# Patient Record
Sex: Female | Born: 1950 | Race: White | Hispanic: No | Smoking: Current every day smoker
Health system: Southern US, Community
[De-identification: ages and names within clinical notes are randomized; demographics above are authoritative.]

## PROBLEM LIST (undated history)

## (undated) VITALS — BP 91/68 | HR 87 | Temp 97.7°F | Resp 16 | Ht 61.5 in | Wt 177.0 lb

## (undated) DIAGNOSIS — R51 Headache: Secondary | ICD-10-CM

## (undated) DIAGNOSIS — J9691 Respiratory failure, unspecified with hypoxia: Secondary | ICD-10-CM

## (undated) DIAGNOSIS — H9190 Unspecified hearing loss, unspecified ear: Secondary | ICD-10-CM

## (undated) DIAGNOSIS — K7460 Unspecified cirrhosis of liver: Secondary | ICD-10-CM

## (undated) DIAGNOSIS — J189 Pneumonia, unspecified organism: Secondary | ICD-10-CM

## (undated) DIAGNOSIS — I5030 Unspecified diastolic (congestive) heart failure: Secondary | ICD-10-CM

## (undated) DIAGNOSIS — F419 Anxiety disorder, unspecified: Secondary | ICD-10-CM

## (undated) DIAGNOSIS — M2550 Pain in unspecified joint: Secondary | ICD-10-CM

## (undated) DIAGNOSIS — Z8489 Family history of other specified conditions: Secondary | ICD-10-CM

## (undated) DIAGNOSIS — M2540 Effusion, unspecified joint: Secondary | ICD-10-CM

## (undated) DIAGNOSIS — F329 Major depressive disorder, single episode, unspecified: Secondary | ICD-10-CM

## (undated) DIAGNOSIS — F1010 Alcohol abuse, uncomplicated: Secondary | ICD-10-CM

## (undated) DIAGNOSIS — I10 Essential (primary) hypertension: Secondary | ICD-10-CM

## (undated) DIAGNOSIS — F1020 Alcohol dependence, uncomplicated: Secondary | ICD-10-CM

## (undated) DIAGNOSIS — R351 Nocturia: Secondary | ICD-10-CM

## (undated) DIAGNOSIS — D649 Anemia, unspecified: Secondary | ICD-10-CM

## (undated) DIAGNOSIS — J449 Chronic obstructive pulmonary disease, unspecified: Secondary | ICD-10-CM

## (undated) DIAGNOSIS — R6510 Systemic inflammatory response syndrome (SIRS) of non-infectious origin without acute organ dysfunction: Secondary | ICD-10-CM

## (undated) DIAGNOSIS — M1990 Unspecified osteoarthritis, unspecified site: Secondary | ICD-10-CM

## (undated) DIAGNOSIS — J45909 Unspecified asthma, uncomplicated: Secondary | ICD-10-CM

## (undated) DIAGNOSIS — F32A Depression, unspecified: Secondary | ICD-10-CM

## (undated) DIAGNOSIS — Z9981 Dependence on supplemental oxygen: Secondary | ICD-10-CM

## (undated) DIAGNOSIS — R531 Weakness: Secondary | ICD-10-CM

## (undated) DIAGNOSIS — R350 Frequency of micturition: Secondary | ICD-10-CM

## (undated) HISTORY — DX: Respiratory failure, unspecified with hypoxia: J96.91

## (undated) HISTORY — PX: CYSTECTOMY: SHX5119

## (undated) HISTORY — DX: Essential (primary) hypertension: I10

## (undated) HISTORY — DX: Depression, unspecified: F32.A

## (undated) HISTORY — PX: DILATION AND CURETTAGE OF UTERUS: SHX78

## (undated) HISTORY — PX: OTHER SURGICAL HISTORY: SHX169

## (undated) HISTORY — DX: Major depressive disorder, single episode, unspecified: F32.9

## (undated) HISTORY — DX: Unspecified diastolic (congestive) heart failure: I50.30

## (undated) HISTORY — PX: TUBAL LIGATION: SHX77

## (undated) HISTORY — PX: CHOLECYSTECTOMY: SHX55

## (undated) HISTORY — PX: ABDOMINAL HYSTERECTOMY: SHX81

## (undated) HISTORY — DX: Alcohol dependence, uncomplicated: F10.20

## (undated) HISTORY — DX: Unspecified hearing loss, unspecified ear: H91.90

## (undated) HISTORY — PX: TOTAL ABDOMINAL HYSTERECTOMY: SHX209

## (undated) HISTORY — DX: Pneumonia, unspecified organism: J18.9

## (undated) HISTORY — DX: Systemic inflammatory response syndrome (sirs) of non-infectious origin without acute organ dysfunction: R65.10

## (undated) HISTORY — DX: Dependence on supplemental oxygen: Z99.81

## (undated) HISTORY — DX: Chronic obstructive pulmonary disease, unspecified: J44.9

---

## 2004-11-11 ENCOUNTER — Emergency Department (HOSPITAL_COMMUNITY): Admission: EM | Admit: 2004-11-11 | Discharge: 2004-11-11 | Payer: Self-pay | Admitting: Emergency Medicine

## 2008-08-14 ENCOUNTER — Emergency Department (HOSPITAL_BASED_OUTPATIENT_CLINIC_OR_DEPARTMENT_OTHER): Admission: EM | Admit: 2008-08-14 | Discharge: 2008-08-14 | Payer: Self-pay | Admitting: Emergency Medicine

## 2008-08-16 ENCOUNTER — Emergency Department (HOSPITAL_COMMUNITY): Admission: EM | Admit: 2008-08-16 | Discharge: 2008-08-16 | Payer: Self-pay | Admitting: Emergency Medicine

## 2008-08-19 ENCOUNTER — Ambulatory Visit: Payer: Self-pay | Admitting: Internal Medicine

## 2008-08-19 DIAGNOSIS — R209 Unspecified disturbances of skin sensation: Secondary | ICD-10-CM | POA: Insufficient documentation

## 2008-08-19 DIAGNOSIS — I10 Essential (primary) hypertension: Secondary | ICD-10-CM | POA: Insufficient documentation

## 2008-08-19 LAB — CONVERTED CEMR LAB
Folate: 18.5 ng/mL
Relative Index: 1.9 (ref 0.0–2.5)
TSH: 1.76 microintl units/mL (ref 0.35–5.50)
Total CK: 97 units/L (ref 7–177)

## 2008-08-21 DIAGNOSIS — D649 Anemia, unspecified: Secondary | ICD-10-CM

## 2008-08-21 DIAGNOSIS — I10 Essential (primary) hypertension: Secondary | ICD-10-CM | POA: Insufficient documentation

## 2008-09-02 ENCOUNTER — Ambulatory Visit: Payer: Self-pay | Admitting: Internal Medicine

## 2009-11-03 ENCOUNTER — Emergency Department (HOSPITAL_COMMUNITY): Admission: EM | Admit: 2009-11-03 | Discharge: 2009-11-03 | Payer: Self-pay | Admitting: Emergency Medicine

## 2009-11-12 ENCOUNTER — Ambulatory Visit: Payer: Self-pay | Admitting: Internal Medicine

## 2009-11-12 DIAGNOSIS — F17200 Nicotine dependence, unspecified, uncomplicated: Secondary | ICD-10-CM | POA: Insufficient documentation

## 2010-06-16 ENCOUNTER — Inpatient Hospital Stay (HOSPITAL_COMMUNITY)
Admission: EM | Admit: 2010-06-16 | Discharge: 2010-06-20 | Disposition: A | Discharge status: Other Acute Inpatient Hospital | Payer: Self-pay | Source: Home / Self Care | Admitting: Emergency Medicine

## 2010-06-19 ENCOUNTER — Ambulatory Visit: Payer: Self-pay | Admitting: Psychiatry

## 2010-06-20 ENCOUNTER — Inpatient Hospital Stay (HOSPITAL_COMMUNITY): Admission: AD | Admit: 2010-06-20 | Discharge: 2010-06-25 | Payer: Self-pay | Admitting: Psychiatry

## 2010-08-13 ENCOUNTER — Ambulatory Visit: Payer: Self-pay | Admitting: Psychiatry

## 2010-09-10 ENCOUNTER — Emergency Department (HOSPITAL_COMMUNITY)
Admission: EM | Admit: 2010-09-10 | Discharge: 2010-09-10 | Payer: Self-pay | Source: Home / Self Care | Admitting: Family Medicine

## 2010-10-27 NOTE — Letter (Signed)
Summary: Out of Work  [facility]  [address] [place], [place] [address]   Phone: [phone]  Fax: [phone]    November 12, 2009   Employee:  VIVIANNE CARLES Gola    To Whom It May Concern:   For Medical reasons, please excuse the above named employee from work for the following dates:  Start:   11/03/09  End:   11/17/09  If you need additional information, please feel free to contact our office.         Sincerely,    Etta Grandchild MD

## 2010-10-27 NOTE — Assessment & Plan Note (Signed)
Summary: post hospital [place]/#/cd   Vital Signs:  Patient profile:   60 year old female Weight:      178 pounds O2 Sat:      94 % on Room air Temp:     98.8 degrees F oral Pulse rate:   90 / minute Pulse rhythm:   regular Resp:     20 per minute BP sitting:   144 / 92  (left arm) Cuff size:   large  Vitals Entered By: Rock Nephew CMA (November 12, 2009 3:09 PM)  O2 Flow:  Room air CC: hospital follow up/discuss BP   Primary Care Provider:  Etta Grandchild MD  CC:  hospital follow up/discuss BP.  History of Present Illness: She returns for f/up after a viral URI with COPD exacerbation that sent her to ther ER and [facility]. She feels much better with less cough. She had a normal CT of the chest and labs were normal with no anemia. BS was 112 with normal renal function.  Preventive Screening-Counseling & Management  Alcohol-Tobacco     Alcohol drinks/day: 0     Smoking Status: current     Smoking Cessation Counseling: yes     Smoke Cessation Stage: ready     Packs/Day: 2     Year Started: 1969     Cans of tobacco/week: no     Passive Smoke Exposure: yes  Hep-HIV-STD-Contraception     Hepatitis Risk: no risk noted     HIV Risk: no     STD Risk: no risk noted      Drug Use:  no.    Medications Prior to Update: 1)  Diovan Hct 320-25 Mg Tabs (Valsartan-Hydrochlorothiazide) .... Once Daily 2)  Ibuprofen 800 Mg Tabs (Ibuprofen) .... As Needed  Allergies (verified): 1)  ! Codeine  Past History:  Past Medical History: Reviewed history from 08/19/2008 and no changes required. Anemia-NOS Hypertension  Past Surgical History: Reviewed history from 08/19/2008 and no changes required. Denies surgical history  Family History: Reviewed history from 08/19/2008 and no changes required. Family History Hypertension  Social History: Reviewed history from 08/19/2008 and no changes required. Divorced Current Smoker Alcohol use-no Drug use-no Regular  exercise-no Hepatitis Risk:  no risk noted STD Risk:  no risk noted  Review of Systems       The patient complains of weight gain.  The patient denies anorexia, fever, weight loss, chest pain, syncope, dyspnea on exertion, peripheral edema, headaches, hemoptysis, melena, hematuria, suspicious skin lesions, abnormal bleeding, enlarged lymph nodes, angioedema, and breast masses.    Physical Exam  General:  Well-developed,well-nourished,in no acute distress; alert,appropriate and cooperative throughout examination Head:  normocephalic, atraumatic, no abnormalities observed, and no abnormalities palpated.   Ears:  R ear normal and L ear normal.   Mouth:  Oral mucosa and oropharynx without lesions or exudates.  Teeth in good repair. Neck:  supple, full ROM, no masses, and no thyromegaly.   Lungs:  Normal respiratory effort, chest expands symmetrically. Lungs are clear to auscultation, no crackles or wheezes. Heart:  Normal rate and regular rhythm. S1 and S2 normal without gallop, murmur, click, rub or other extra sounds. Abdomen:  soft, non-tender, normal bowel sounds, no hepatomegaly, and no splenomegaly.   Msk:  No deformity or scoliosis noted of thoracic or lumbar spine.   Pulses:  R and L carotid,radial,femoral,dorsalis pedis and posterior tibial pulses are full and equal bilaterally Extremities:  No clubbing, cyanosis, edema, or deformity noted with normal full  range of motion of all joints.   Neurologic:  No cranial nerve deficits noted. Station and gait are normal. Plantar reflexes are down-going bilaterally. DTRs are symmetrical throughout. Sensory, motor and coordinative functions appear intact. Skin:  turgor normal, color normal, no rashes, and no edema.   Cervical Nodes:  no anterior cervical adenopathy and no posterior cervical adenopathy.   Axillary Nodes:  no R axillary adenopathy and no L axillary adenopathy.   Psych:  Cognition and judgment appear intact. Alert and cooperative  with normal attention span and concentration. No apparent delusions, illusions, hallucinations   Impression & Recommendations:  Problem # 1:  HYPERTENSION (ICD-401.9) Assessment Unchanged she has been out of DiovanHCT 320-25 so will start her at a new dose today. The following medications were removed from the medication list:    Diovan Hct 320-25 Mg Tabs (Valsartan-hydrochlorothiazide) ..... Once daily Her updated medication list for this problem includes:    Diovan Hct 80-12.5 Mg Tabs (Valsartan-hydrochlorothiazide) ..... One by mouth once daily for high blood pressure  BP today: 144/92 Prior BP: 124/84 (09/02/2008)  Prior 10 Yr Risk Heart Disease: Not enough information (09/02/2008)  Problem # 2:  COPD, MILD (ICD-496) Assessment: New  Her updated medication list for this problem includes:    Dulera 100-5 Mcg/act Aero (Mometasone furo-formoterol fum) .Marland Kitchen... 2 puffs bid  Pulmonary Functions Reviewed: O2 sat: 94 (11/12/2009)     Problem # 3:  TOBACCO USE (ICD-305.1) Assessment: New  Encouraged smoking cessation and discussed different methods for smoking cessation.   Complete Medication List: 1)  Ibuprofen 800 Mg Tabs (Ibuprofen) .... As needed 2)  Diovan Hct 80-12.5 Mg Tabs (Valsartan-hydrochlorothiazide) .... One by mouth once daily for high blood pressure 3)  Dulera 100-5 Mcg/act Aero (Mometasone furo-formoterol fum) .... 2 puffs bid  Patient Instructions: 1)  Please schedule a follow-up appointment in 2 months. 2)  Tobacco is very bad for your health and your loved ones! You Should stop smoking!. 3)  Stop Smoking Tips: Choose a Quit date. Cut down before the Quit date. decide what you will do as a substitute when you feel the urge to smoke(gum,toothpick,exercise). 4)  Check your Blood Pressure regularly. If it is above 130/80: you should make an appointment. Prescriptions: DULERA 100-5 MCG/ACT AERO (MOMETASONE FURO-FORMOTEROL FUM) 2 puffs BID  #8 inhs. x 0   Entered and  Authorized by:   Etta Grandchild MD   Signed by:   Etta Grandchild MD on 11/12/2009   Method used:   Samples Given   RxID:   [id number] DIOVAN HCT 80-12.5 MG TABS (VALSARTAN-HYDROCHLOROTHIAZIDE) One by mouth once daily for high blood pressure  #112 x 0   Entered and Authorized by:   Etta Grandchild MD   Signed by:   Etta Grandchild MD on 11/12/2009   Method used:   Historical   RxID:   [id number]

## 2010-11-16 ENCOUNTER — Telehealth (INDEPENDENT_AMBULATORY_CARE_PROVIDER_SITE_OTHER): Payer: Self-pay | Admitting: *Deleted

## 2010-11-24 NOTE — Progress Notes (Signed)
  Phone Note Other Incoming   Request: Send information Summary of Call: Request for records received from [facility]. Request forwarded to [facility].  09/2008

## 2010-12-10 LAB — COMPREHENSIVE METABOLIC PANEL
AST: 142 U/L — ABNORMAL HIGH (ref 0–37)
AST: 152 U/L — ABNORMAL HIGH (ref 0–37)
AST: 196 U/L — ABNORMAL HIGH (ref 0–37)
Alkaline Phosphatase: 77 U/L (ref 39–117)
BUN: 13 mg/dL (ref 6–23)
BUN: 9 mg/dL (ref 6–23)
BUN: 9 mg/dL (ref 6–23)
CO2: 25 mEq/L (ref 19–32)
CO2: 25 mEq/L (ref 19–32)
CO2: 27 mEq/L (ref 19–32)
Calcium: 10.5 mg/dL (ref 8.4–10.5)
Calcium: 9 mg/dL (ref 8.4–10.5)
Chloride: 103 mEq/L (ref 96–112)
Creatinine, Ser: 0.66 mg/dL (ref 0.4–1.2)
Creatinine, Ser: 0.67 mg/dL (ref 0.4–1.2)
Creatinine, Ser: 0.73 mg/dL (ref 0.4–1.2)
GFR calc Af Amer: >60 mL/min (ref >60)
GFR calc Af Amer: >60 mL/min (ref >60)
GFR calc non Af Amer: >60 mL/min (ref >60)
GFR calc non Af Amer: >60 mL/min (ref >60)
GFR calc non Af Amer: >60 mL/min (ref >60)
Glucose, Bld: 130 mg/dL — ABNORMAL HIGH (ref 70–99)
Glucose, Bld: 97 mg/dL (ref 70–99)
Potassium: 3.8 mEq/L (ref 3.5–5.1)
Total Bilirubin: 0.8 mg/dL (ref 0.3–1.2)
Total Protein: 7.3 g/dL (ref 6.0–8.3)
Total Protein: 8.8 g/dL — ABNORMAL HIGH (ref 6.0–8.3)

## 2010-12-10 LAB — CBC
HCT: 37.2 % (ref 36.0–46.0)
HCT: 40.9 % (ref 36.0–46.0)
Hemoglobin: 12.8 g/dL (ref 12.0–15.0)
MCH: 32.9 pg (ref 26.0–34.0)
MCH: 32.9 pg (ref 26.0–34.0)
MCH: 33.1 pg (ref 26.0–34.0)
MCH: 33.3 pg (ref 26.0–34.0)
MCHC: 34.5 g/dL (ref 30.0–36.0)
MCHC: 34.7 g/dL (ref 30.0–36.0)
MCV: 96 fL (ref 78.0–100.0)
MCV: 96.4 fL (ref 78.0–100.0)
Platelets: 175 10*3/uL (ref 150–400)
Platelets: 196 10*3/uL (ref 150–400)
RBC: 3.8 MIL/uL — ABNORMAL LOW (ref 3.87–5.11)
RDW: 13.4 % (ref 11.5–15.5)
RDW: 14.4 % (ref 11.5–15.5)
WBC: 11.5 10*3/uL — ABNORMAL HIGH (ref 4.0–10.5)

## 2010-12-10 LAB — GLUCOSE, CAPILLARY
Glucose-Capillary: 101 mg/dL — ABNORMAL HIGH (ref 70–99)
Glucose-Capillary: 105 mg/dL — ABNORMAL HIGH (ref 70–99)
Glucose-Capillary: 113 mg/dL — ABNORMAL HIGH (ref 70–99)
Glucose-Capillary: 126 mg/dL — ABNORMAL HIGH (ref 70–99)
Glucose-Capillary: 131 mg/dL — ABNORMAL HIGH (ref 70–99)
Glucose-Capillary: 200 mg/dL — ABNORMAL HIGH (ref 70–99)

## 2010-12-10 LAB — DIFFERENTIAL
Basophils Absolute: 0.2 10*3/uL — ABNORMAL HIGH (ref 0.0–0.1)
Basophils Relative: 2 % — ABNORMAL HIGH (ref 0–1)
Monocytes Relative: 5 % (ref 3–12)
Neutro Abs: 12.9 10*3/uL — ABNORMAL HIGH (ref 1.7–7.7)
Neutrophils Relative %: 89 % — ABNORMAL HIGH (ref 43–77)

## 2010-12-10 LAB — AMMONIA: Ammonia: 11 umol/L (ref 11–35)

## 2010-12-10 LAB — URINALYSIS, ROUTINE W REFLEX MICROSCOPIC
Nitrite: POSITIVE — AB
Protein, ur: 30 mg/dL — AB
Specific Gravity, Urine: 1.011 (ref 1.005–1.030)
Urobilinogen, UA: 0.2 mg/dL (ref 0.0–1.0)

## 2010-12-10 LAB — CULTURE, BLOOD (ROUTINE X 2)
Culture  Setup Time: 201109211353
Culture: NO GROWTH

## 2010-12-10 LAB — HEMOGLOBIN A1C
Hgb A1c MFr Bld: 5.8 % — ABNORMAL HIGH (ref <5.7)
Mean Plasma Glucose: 120 mg/dL — ABNORMAL HIGH (ref <117)

## 2010-12-10 LAB — T4: T4, Total: 6.9 ug/dL (ref 5.0–12.5)

## 2010-12-10 LAB — SALICYLATE LEVEL: Salicylate Lvl: <4 mg/dL (ref 2.8–20.0)

## 2010-12-10 LAB — URINE MICROSCOPIC-ADD ON

## 2010-12-10 LAB — PROTIME-INR
INR: 1.15 (ref 0.00–1.49)
Prothrombin Time: 14.9 seconds (ref 11.6–15.2)

## 2010-12-10 LAB — TSH
TSH: 0.699 u[IU]/mL (ref 0.350–4.500)
TSH: 1.259 u[IU]/mL (ref 0.350–4.500)

## 2010-12-10 LAB — BLOOD GAS, ARTERIAL
Bicarbonate: 20.1 mEq/L (ref 20.0–24.0)
pCO2 arterial: 37.3 mmHg (ref 35.0–45.0)
pH, Arterial: 7.35 (ref 7.350–7.400)
pO2, Arterial: 116 mmHg — ABNORMAL HIGH (ref 80.0–100.0)

## 2010-12-10 LAB — RAPID URINE DRUG SCREEN, HOSP PERFORMED
Amphetamines: NOT DETECTED
Barbiturates: NOT DETECTED
Opiates: NOT DETECTED

## 2010-12-10 LAB — URINE CULTURE
Colony Count: >=100000
Culture  Setup Time: 201109210227

## 2010-12-10 LAB — MRSA PCR SCREENING: MRSA by PCR: NEGATIVE

## 2010-12-10 LAB — LIPID PANEL
Cholesterol: 197 mg/dL (ref 0–200)
LDL Cholesterol: 126 mg/dL — ABNORMAL HIGH (ref 0–99)
Triglycerides: 146 mg/dL (ref <150)

## 2010-12-10 LAB — CARDIAC PANEL(CRET KIN+CKTOT+MB+TROPI)
CK, MB: 19.5 ng/mL (ref 0.3–4.0)
Relative Index: 0.6 (ref 0.0–2.5)
Relative Index: 0.7 (ref 0.0–2.5)
Relative Index: 0.9 (ref 0.0–2.5)
Total CK: 933 U/L — ABNORMAL HIGH (ref 7–177)
Troponin I: 0.01 ng/mL (ref 0.00–0.06)
Troponin I: 0.02 ng/mL (ref 0.00–0.06)
Troponin I: 0.02 ng/mL (ref 0.00–0.06)

## 2010-12-10 LAB — HEPATIC FUNCTION PANEL
Albumin: 3.4 g/dL — ABNORMAL LOW (ref 3.5–5.2)
Indirect Bilirubin: 0.1 mg/dL — ABNORMAL LOW (ref 0.3–0.9)
Total Protein: 8.5 g/dL — ABNORMAL HIGH (ref 6.0–8.3)

## 2010-12-10 LAB — HEPATITIS PANEL, ACUTE: HCV Ab: NEGATIVE

## 2010-12-17 LAB — POCT I-STAT, CHEM 8
BUN: 6 mg/dL (ref 6–23)
Creatinine, Ser: 0.7 mg/dL (ref 0.4–1.2)
Glucose, Bld: 112 mg/dL — ABNORMAL HIGH (ref 70–99)
Hemoglobin: 14.6 g/dL (ref 12.0–15.0)
TCO2: 23 mmol/L (ref 0–100)

## 2010-12-17 LAB — DIFFERENTIAL
Eosinophils Absolute: 0.1 10*3/uL (ref 0.0–0.7)
Eosinophils Relative: 1 % (ref 0–5)
Lymphocytes Relative: 16 % (ref 12–46)
Lymphs Abs: 1.5 10*3/uL (ref 0.7–4.0)
Monocytes Relative: 9 % (ref 3–12)

## 2010-12-17 LAB — CBC
HCT: 40.4 % (ref 36.0–46.0)
Hemoglobin: 13.9 g/dL (ref 12.0–15.0)
MCV: 98.3 fL (ref 78.0–100.0)
RBC: 4.11 MIL/uL (ref 3.87–5.11)
WBC: 9.7 10*3/uL (ref 4.0–10.5)

## 2010-12-30 ENCOUNTER — Emergency Department (HOSPITAL_COMMUNITY): Payer: Medicaid Other

## 2010-12-30 ENCOUNTER — Emergency Department (HOSPITAL_COMMUNITY)
Admission: EM | Admit: 2010-12-30 | Discharge: 2010-12-30 | Disposition: A | Discharge status: Home / Self Care | Payer: Medicaid Other | Attending: Emergency Medicine | Admitting: Emergency Medicine

## 2010-12-30 DIAGNOSIS — N390 Urinary tract infection, site not specified: Secondary | ICD-10-CM | POA: Insufficient documentation

## 2010-12-30 DIAGNOSIS — F329 Major depressive disorder, single episode, unspecified: Secondary | ICD-10-CM | POA: Insufficient documentation

## 2010-12-30 DIAGNOSIS — Z79899 Other long term (current) drug therapy: Secondary | ICD-10-CM | POA: Insufficient documentation

## 2010-12-30 DIAGNOSIS — F3289 Other specified depressive episodes: Secondary | ICD-10-CM | POA: Insufficient documentation

## 2010-12-30 DIAGNOSIS — F29 Unspecified psychosis not due to a substance or known physiological condition: Secondary | ICD-10-CM | POA: Insufficient documentation

## 2010-12-30 LAB — COMPREHENSIVE METABOLIC PANEL
ALT: 23 U/L (ref 0–35)
AST: 44 U/L — ABNORMAL HIGH (ref 0–37)
Albumin: 3.3 g/dL — ABNORMAL LOW (ref 3.5–5.2)
CO2: 23 mEq/L (ref 19–32)
Chloride: 107 mEq/L (ref 96–112)
Creatinine, Ser: 0.54 mg/dL (ref 0.4–1.2)
GFR calc Af Amer: >60 mL/min (ref >60)
Potassium: 3.5 mEq/L (ref 3.5–5.1)
Sodium: 137 mEq/L (ref 135–145)
Total Bilirubin: 0.7 mg/dL (ref 0.3–1.2)

## 2010-12-30 LAB — CBC
Hemoglobin: 11.7 g/dL — ABNORMAL LOW (ref 12.0–15.0)
Platelets: 226 10*3/uL (ref 150–400)
RBC: 3.84 MIL/uL — ABNORMAL LOW (ref 3.87–5.11)
WBC: 7.6 10*3/uL (ref 4.0–10.5)

## 2010-12-30 LAB — DIFFERENTIAL
Basophils Absolute: 0 10*3/uL (ref 0.0–0.1)
Eosinophils Absolute: 0 10*3/uL (ref 0.0–0.7)
Lymphocytes Relative: 32 % (ref 12–46)
Monocytes Relative: 10 % (ref 3–12)
Neutro Abs: 4.4 10*3/uL (ref 1.7–7.7)
Neutrophils Relative %: 58 % (ref 43–77)

## 2010-12-30 LAB — RAPID URINE DRUG SCREEN, HOSP PERFORMED
Amphetamines: NOT DETECTED
Benzodiazepines: NOT DETECTED
Cocaine: NOT DETECTED
Opiates: NOT DETECTED

## 2010-12-30 LAB — URINALYSIS, ROUTINE W REFLEX MICROSCOPIC
Glucose, UA: NEGATIVE mg/dL
Hgb urine dipstick: NEGATIVE
Specific Gravity, Urine: 1.037 — ABNORMAL HIGH (ref 1.005–1.030)
Urobilinogen, UA: 1 mg/dL (ref 0.0–1.0)

## 2010-12-30 LAB — URINE MICROSCOPIC-ADD ON

## 2010-12-30 LAB — POCT CARDIAC MARKERS: Myoglobin, poc: 104 ng/mL (ref 12–200)

## 2010-12-30 LAB — AMMONIA: Ammonia: 15 umol/L (ref 11–35)

## 2011-01-01 LAB — URINE CULTURE
Colony Count: >=100000
Culture  Setup Time: 201204042349

## 2011-01-20 ENCOUNTER — Inpatient Hospital Stay (HOSPITAL_COMMUNITY)
Admission: EM | Admit: 2011-01-20 | Discharge: 2011-01-27 | DRG: 918 | Disposition: A | Discharge status: Home / Self Care | Payer: Medicaid Other | Attending: Internal Medicine | Admitting: Internal Medicine

## 2011-01-20 ENCOUNTER — Emergency Department (HOSPITAL_COMMUNITY): Payer: Medicaid Other

## 2011-01-20 DIAGNOSIS — T398X2A Poisoning by other nonopioid analgesics and antipyretics, not elsewhere classified, intentional self-harm, initial encounter: Secondary | ICD-10-CM | POA: Diagnosis present

## 2011-01-20 DIAGNOSIS — T394X2A Poisoning by antirheumatics, not elsewhere classified, intentional self-harm, initial encounter: Secondary | ICD-10-CM | POA: Diagnosis present

## 2011-01-20 DIAGNOSIS — E876 Hypokalemia: Secondary | ICD-10-CM | POA: Diagnosis present

## 2011-01-20 DIAGNOSIS — F17200 Nicotine dependence, unspecified, uncomplicated: Secondary | ICD-10-CM | POA: Diagnosis present

## 2011-01-20 DIAGNOSIS — T39094A Poisoning by salicylates, undetermined, initial encounter: Principal | ICD-10-CM | POA: Diagnosis present

## 2011-01-20 DIAGNOSIS — F1010 Alcohol abuse, uncomplicated: Secondary | ICD-10-CM | POA: Diagnosis present

## 2011-01-20 DIAGNOSIS — R5381 Other malaise: Secondary | ICD-10-CM | POA: Diagnosis present

## 2011-01-20 DIAGNOSIS — I10 Essential (primary) hypertension: Secondary | ICD-10-CM | POA: Diagnosis present

## 2011-01-20 DIAGNOSIS — F339 Major depressive disorder, recurrent, unspecified: Secondary | ICD-10-CM | POA: Diagnosis present

## 2011-01-20 DIAGNOSIS — D649 Anemia, unspecified: Secondary | ICD-10-CM | POA: Diagnosis present

## 2011-01-20 DIAGNOSIS — R29898 Other symptoms and signs involving the musculoskeletal system: Secondary | ICD-10-CM | POA: Diagnosis present

## 2011-01-20 DIAGNOSIS — R404 Transient alteration of awareness: Secondary | ICD-10-CM | POA: Diagnosis present

## 2011-01-20 DIAGNOSIS — J441 Chronic obstructive pulmonary disease with (acute) exacerbation: Secondary | ICD-10-CM | POA: Diagnosis present

## 2011-01-20 DIAGNOSIS — Z880 Allergy status to penicillin: Secondary | ICD-10-CM

## 2011-01-20 LAB — BLOOD GAS, ARTERIAL
Acid-base deficit: 6.4 mmol/L — ABNORMAL HIGH (ref 0.0–2.0)
Bicarbonate: 18.6 mEq/L — ABNORMAL LOW (ref 20.0–24.0)
TCO2: 17.6 mmol/L (ref 0–100)
pCO2 arterial: 37.5 mmHg (ref 35.0–45.0)
pH, Arterial: 7.317 — ABNORMAL LOW (ref 7.350–7.400)
pO2, Arterial: 85.1 mmHg (ref 80.0–100.0)

## 2011-01-20 LAB — CBC
Platelets: 198 10*3/uL (ref 150–400)
RBC: 3.37 MIL/uL — ABNORMAL LOW (ref 3.87–5.11)
RDW: 17 % — ABNORMAL HIGH (ref 11.5–15.5)
WBC: 6.7 10*3/uL (ref 4.0–10.5)

## 2011-01-20 LAB — COMPREHENSIVE METABOLIC PANEL
ALT: 13 U/L (ref 0–35)
CO2: 20 mEq/L (ref 19–32)
Calcium: 8.4 mg/dL (ref 8.4–10.5)
Creatinine, Ser: 0.69 mg/dL (ref 0.4–1.2)
GFR calc Af Amer: >60 mL/min (ref >60)
GFR calc non Af Amer: >60 mL/min (ref >60)
Glucose, Bld: 102 mg/dL — ABNORMAL HIGH (ref 70–99)
Sodium: 140 mEq/L (ref 135–145)
Total Protein: 7.4 g/dL (ref 6.0–8.3)

## 2011-01-20 LAB — ETHANOL: Alcohol, Ethyl (B): <5 mg/dL (ref 0–10)

## 2011-01-20 LAB — RAPID URINE DRUG SCREEN, HOSP PERFORMED

## 2011-01-20 LAB — DIFFERENTIAL
Basophils Relative: 0 % (ref 0–1)
Eosinophils Absolute: 0 10*3/uL (ref 0.0–0.7)
Eosinophils Relative: 0 % (ref 0–5)
Neutrophils Relative %: 68 % (ref 43–77)

## 2011-01-20 LAB — URINALYSIS, ROUTINE W REFLEX MICROSCOPIC
Ketones, ur: 40 mg/dL — AB
Nitrite: NEGATIVE
Specific Gravity, Urine: 1.037 — ABNORMAL HIGH (ref 1.005–1.030)
Urobilinogen, UA: 0.2 mg/dL (ref 0.0–1.0)
pH: 5.5 (ref 5.0–8.0)

## 2011-01-20 LAB — SALICYLATE LEVEL: Salicylate Lvl: <4 mg/dL (ref 2.8–20.0)

## 2011-01-21 ENCOUNTER — Inpatient Hospital Stay (HOSPITAL_COMMUNITY): Payer: Medicaid Other

## 2011-01-21 LAB — DIFFERENTIAL
Basophils Absolute: 0 10*3/uL (ref 0.0–0.1)
Basophils Relative: 0 % (ref 0–1)
Eosinophils Relative: 1 % (ref 0–5)
Lymphocytes Relative: 30 % (ref 12–46)
Neutro Abs: 3.8 10*3/uL (ref 1.7–7.7)

## 2011-01-21 LAB — IRON AND TIBC
Iron: 125 ug/dL (ref 42–135)
Saturation Ratios: 50 % (ref 20–55)
UIBC: 126 ug/dL

## 2011-01-21 LAB — SALICYLATE LEVEL: Salicylate Lvl: 14.2 mg/dL (ref 2.8–20.0)

## 2011-01-21 LAB — COMPREHENSIVE METABOLIC PANEL
ALT: 11 U/L (ref 0–35)
AST: 24 U/L (ref 0–37)
Alkaline Phosphatase: 38 U/L — ABNORMAL LOW (ref 39–117)
GFR calc Af Amer: >60 mL/min (ref >60)
Glucose, Bld: 63 mg/dL — ABNORMAL LOW (ref 70–99)
Potassium: 3.4 mEq/L — ABNORMAL LOW (ref 3.5–5.1)
Sodium: 142 mEq/L (ref 135–145)
Total Protein: 6.2 g/dL (ref 6.0–8.3)

## 2011-01-21 LAB — CK TOTAL AND CKMB (NOT AT ARMC)
CK, MB: 2.2 ng/mL (ref 0.3–4.0)
Total CK: 102 U/L (ref 7–177)

## 2011-01-21 LAB — CBC
HCT: 30.4 % — ABNORMAL LOW (ref 36.0–46.0)
Hemoglobin: 9.7 g/dL — ABNORMAL LOW (ref 12.0–15.0)
RDW: 17.5 % — ABNORMAL HIGH (ref 11.5–15.5)
WBC: 6.2 10*3/uL (ref 4.0–10.5)

## 2011-01-21 LAB — APTT: aPTT: 36 seconds (ref 24–37)

## 2011-01-21 LAB — FERRITIN: Ferritin: 90 ng/mL (ref 10–291)

## 2011-01-21 LAB — T3: T3, Total: 11.8 ng/dl — ABNORMAL LOW (ref 80.0–204.0)

## 2011-01-21 LAB — MAGNESIUM: Magnesium: 1.9 mg/dL (ref 1.5–2.5)

## 2011-01-21 LAB — TSH: TSH: 0.62 u[IU]/mL (ref 0.350–4.500)

## 2011-01-21 NOTE — H&P (Signed)
NAME:  [name] [name], [name] [name]              ACCOUNT NO.:  [id number]  MEDICAL RECORD NO.:  [id number]           PATIENT TYPE:  E  LOCATION:  WLED                         FACILITY:  [facility]  PHYSICIAN:  Talmage Nap, MD  DATE OF BIRTH:  Apr 08, 1951  DATE OF ADMISSION:  01/20/2011 DATE OF DISCHARGE:                             HISTORY & PHYSICAL   PRIMARY CARE PHYSICIAN:  Windle Guard, MD, Family Practice.  PRIMARY PSYCHIATRIST:  Eulogio Ditch, MD  History obtainable from the patient, no family member available to give any history.  CHIEF COMPLAINT:  Lower extremity weakness on and off for unspecified duration.  The patient is a 60 year old Caucasian female with a history of chronic alcohol use and major depressive disorder  was brought to the emergency room because the patient was said to have taken some salicylates, dose unverified according to the ED physician; however, when the patient was seen by me, she complained about lower extremity weakness of unspecified duration.  She denied any history of a fall. She denied any history of chest pain or shortness of breath.  She denied any fever.  She denied any chills.  She denied any rigor.  PAST MEDICAL HISTORY:  Positive for, 1. Major depressive disorder. 2. Chronic alcohol use. 3. Chronic tobacco use. 4. Hypertension.  PAST SURGICAL HISTORY:  Hysterectomy.  SOCIAL HISTORY:  The patient smokes on a daily basis, could not quantify how much she smoked.  She also drinks on a daily basis.  FAMILY HISTORY:  York Spaniel to be significant for major depressive illness and massive colon CA.  PREADMISSION MEDICATION:  Include, 1. Ciprofloxacin. 2. Diovan. 3. Sertraline.  ALLERGIES:  To PENICILLIN.  REVIEW OF SYSTEMS:  The patient denies any history of headaches.  No blurred vision.  No nausea or vomiting.  No fever, no chills, no rigor. No chest pain or shortness of breath.  Denies any cough.  No abdominal discomfort.  No diarrhea  or hematochezia.  No dysuria or hematuria.  No swelling of the lower extremity.  Complaint of weakness in the lower extremity.  No neuropsychiatric disorder.  PHYSICAL EXAMINATION:  GENERAL:  A middle-aged lady, delirious, not in any obvious respiratory distress at present. VITAL SIGNS:  Blood pressure is 101/54, pulse 80, respiratory rate 16, temperature 98.2. HEENT:  Mild pallor with slight ptosis of the right eye. NECK:  No jugular venous distention.  No carotid bruit.  No lymphadenopathy. CHEST:  Clear to auscultation. HEART:  Sounds S1, S2 clear. ABDOMEN:  Soft, nontender.  Liver, spleen tip not palpable.  Bowel sounds are positive. EXTREMITIES:  No pedal edema. NEUROLOGIC EXAM:  The patient is slightly delirious and confused, strength right upper extremity 4/5, right lower extremity 5/5, left upper extremity 5/5, left lower extremity 5/5.  Sensory sensation is intact and deep tendon reflex 2/4 upper and lower extremities. SKIN:  Slightly decreased turgor. MUSCULOSKELETAL:  Unremarkable.  LABORATORY DATA:  Arterial blood gases done on the patient showed pH of 7.317, pCO2 of 37.5, pO2 of 85.2 with a bicarb of 80.6.  Salicylate level 6.5, alcohol level less than 5.  Chemistry shows sodium of 140, potassium of  3.2, chloride of 111, with a bicarb of 20.  Glucose is 102, BUN is 18, creatinine 0.69.  LFTs normal.  Acetaminophen level is less than 10.0.  Urine microscopy unremarkable.  Hematological indices showed WBC of 6.7, hemoglobin of 10.1, hematocrit of 30.9, MCV of 95.7, platelet count of 198 with normal differentials.  Chest x-ray is normal.  IMPRESSION: 1. Lower extremity weakness, etiology unknown. 2. Delirium. 3. Chronic alcoholism. 4. Hypersalicylacemia. 5. Hypokalemia. 6. History of major depressive disorder.  PLAN:  To admit the patient to general medical floor on one-on-one watch.  The patient will be given a banana bag, which will include normal saline plus  thiamine 100 mg plus multivitamin 1 ampule plus folate 1 mg plus 20 mEq of KCl IV to go at rate of 150 cc an hour x2 L and thereafter normal saline IV to go at rate of 75 cc an hour.  The patient then will be followed with thiamine 100 mg p.o. daily, folic acid 1 mg p.o. daily and Ativan 1 mg IV q.4 h. p.r.n. for agitation and DT.  She will be on Protonix 40 mg IV q.24 for GI prophylaxis and TED stockings or SCD boots for DVT prophylaxis.  Further labs to be ordered on this patient will include CPK level, stat aldolase level and thyroid panel, which includes TSH, T3 and T4.  Salicylate level will be obtained in the a.m.  CBC, CMP and magnesium will be repeated in the a.m.  The patient will be followed and evaluated on a day-to-day basis.     Talmage Nap, MD    CN/MEDQ  D:  01/20/2011  T:  01/21/2011  Job:  [id number]  Electronically Signed by Talmage Nap  on 01/21/2011 04:17:10 AM

## 2011-01-22 DIAGNOSIS — F1020 Alcohol dependence, uncomplicated: Secondary | ICD-10-CM

## 2011-01-22 LAB — BASIC METABOLIC PANEL
BUN: 6 mg/dL (ref 6–23)
CO2: 24 mEq/L (ref 19–32)
Glucose, Bld: 74 mg/dL (ref 70–99)
Potassium: 3.6 mEq/L (ref 3.5–5.1)
Sodium: 140 mEq/L (ref 135–145)

## 2011-01-22 LAB — CBC
HCT: 30.1 % — ABNORMAL LOW (ref 36.0–46.0)
Hemoglobin: 9.5 g/dL — ABNORMAL LOW (ref 12.0–15.0)
MCV: 94.1 fL (ref 78.0–100.0)
RDW: 17.4 % — ABNORMAL HIGH (ref 11.5–15.5)
WBC: 6.7 10*3/uL (ref 4.0–10.5)

## 2011-01-22 LAB — ALDOLASE: Aldolase: 3.8 U/L (ref <=8.1)

## 2011-01-22 NOTE — Consult Note (Signed)
NAME:  [name] [name]              ACCOUNT NO.:  [id number]  MEDICAL RECORD NO.:  [id number]           PATIENT TYPE:  I  LOCATION:  1506                         [facility]ACILITY[facility]  [facility]  PHYSICIAN:  Eulogio Ditch, MD DATE OF BIRTH:  Feb 06, 1951  DATE OF CONSULTATION:  01/21/2011 DATE OF DISCHARGE:                                CONSULTATION   REASON FOR CONSULTATION:  Questionable overdose, depression.  HISTORY OF PRESENT ILLNESS:  This is a 59-year Caucasian female who was admitted on the medical floor as she was found unresponsive by the daughter at home.  The patient told us that she took 2 to 3 BC powder. The patient reported that she is depressed, cries a lot.  Her sleep and appetite is poor.  She lost 50 pounds in the last 4 months.  The patient also verbalized visual hallucinations on and off but denied hearing any voices.  The patient is a poor historian but able to answer the questions.  The patient denied any past suicide attempt.  The patient reported that she has a history of alcohol abuse.  She was at [facility] in September 2011 for 28 days.  Before that she was using 18 beers per day along with half a gallon of whiskey.  Currently, she denied abusing alcohol but reported one relapse, took 1 beer 3 weeks ago but she is attending the AA meetings in the outpatient setting and she is also seeing a Veterinary surgeon at [facility] at [place].  The patient lives with the daughter.  PAST MEDICAL HISTORY:  History of hypertension.  ALLERGIES:  Allergic to PENICILLIN.  PHYSICAL EXAMINATION:  HEART:  S1, S2 clear. CHEST:  Bilaterally clear. ABDOMEN:  Soft, nontender.  LABORATORY DATA:  LFTs normal.  BUN 18, creatinine 0.69.  Her alcohol level less than 5.  MENTAL STATUS EXAMINATION:  The patient is calm, cooperative during the interview.  Fair eye contact.  Hygiene, grooming fair.  Speech is slow, soft.  The patient was able to answer questions after encouragement. Her thought  process is goal directed.  Thought content; on asking about suicidal ideation, the patient reported it does not matter whether she lives or not at this time but denied any active suicidal plan.  Not delusional.  Thought perception, denied hearing any voices but reported visual hallucinations on and off.  Cognition; alert, awake, oriented x3. Memory; immediate, recent, remote fair to poor.  Attention and concentration poor.  Abstraction ability fair.  Insight and judgment fair.  DIAGNOSIS:  AXIS I:  Chronic alcohol abuse, under partial remission. Major depressive disorder, recurrent type. AXIS II:  Deferred. AXIS III:  Hypertension. AXIS IV:  Unspecified. AXIS V:  30 to 40.  RECOMMENDATIONS: 1. The patient can be continued at this time on Zoloft 100 mg p.o.     daily. 2. We will call the daughter to get more collateral information on     this patient. 3. After the collateral information, we will decide on the disposition     of this patient.  The patient is not cleared by Psychiatry at this     time.  Thanks for  involving me in taking care of this patient.     Eulogio Ditch, MD     SA/MEDQ  D:  01/21/2011  T:  01/21/2011  Job:  [id number]  Electronically Signed by Eulogio Ditch  on 01/22/2011 06:51:57 AM

## 2011-01-23 LAB — CBC
HCT: 32.1 % — ABNORMAL LOW (ref 36.0–46.0)
Hemoglobin: 10.3 g/dL — ABNORMAL LOW (ref 12.0–15.0)
MCH: 29.8 pg (ref 26.0–34.0)
MCV: 92.8 fL (ref 78.0–100.0)
Platelets: 191 10*3/uL (ref 150–400)
RBC: 3.46 MIL/uL — ABNORMAL LOW (ref 3.87–5.11)
WBC: 5.5 10*3/uL (ref 4.0–10.5)

## 2011-01-23 LAB — DIFFERENTIAL
Lymphocytes Relative: 12 % (ref 12–46)
Lymphs Abs: 0.7 10*3/uL (ref 0.7–4.0)
Monocytes Relative: 2 % — ABNORMAL LOW (ref 3–12)
Neutro Abs: 4.8 10*3/uL (ref 1.7–7.7)
Neutrophils Relative %: 86 % — ABNORMAL HIGH (ref 43–77)

## 2011-01-23 LAB — BASIC METABOLIC PANEL
BUN: 3 mg/dL — ABNORMAL LOW (ref 6–23)
CO2: 27 mEq/L (ref 19–32)
Chloride: 110 mEq/L (ref 96–112)
Potassium: 3.7 mEq/L (ref 3.5–5.1)

## 2011-01-24 LAB — CBC
HCT: 33.1 % — ABNORMAL LOW (ref 36.0–46.0)
MCH: 29.4 pg (ref 26.0–34.0)
MCV: 93.5 fL (ref 78.0–100.0)
RDW: 16.6 % — ABNORMAL HIGH (ref 11.5–15.5)
WBC: 8.5 10*3/uL (ref 4.0–10.5)

## 2011-01-24 LAB — BASIC METABOLIC PANEL
BUN: 7 mg/dL (ref 6–23)
Chloride: 105 mEq/L (ref 96–112)
Creatinine, Ser: 0.33 mg/dL — ABNORMAL LOW (ref 0.4–1.2)
GFR calc non Af Amer: >60 mL/min (ref >60)
Glucose, Bld: 151 mg/dL — ABNORMAL HIGH (ref 70–99)
Potassium: 3.4 mEq/L — ABNORMAL LOW (ref 3.5–5.1)

## 2011-01-25 ENCOUNTER — Inpatient Hospital Stay (HOSPITAL_COMMUNITY): Admission: AD | Admit: 2011-01-25 | Payer: Medicaid Other | Source: Ambulatory Visit | Admitting: Psychiatry

## 2011-01-25 DIAGNOSIS — F1020 Alcohol dependence, uncomplicated: Secondary | ICD-10-CM

## 2011-01-25 LAB — BASIC METABOLIC PANEL
BUN: 10 mg/dL (ref 6–23)
CO2: 30 mEq/L (ref 19–32)
Calcium: 9.4 mg/dL (ref 8.4–10.5)
Chloride: 105 mEq/L (ref 96–112)
Creatinine, Ser: 0.44 mg/dL (ref 0.4–1.2)

## 2011-01-26 LAB — BASIC METABOLIC PANEL
BUN: 14 mg/dL (ref 6–23)
Calcium: 9 mg/dL (ref 8.4–10.5)
Creatinine, Ser: 0.55 mg/dL (ref 0.4–1.2)
GFR calc non Af Amer: >60 mL/min (ref >60)
Glucose, Bld: 96 mg/dL (ref 70–99)

## 2011-01-26 LAB — CBC
HCT: 33.1 % — ABNORMAL LOW (ref 36.0–46.0)
MCHC: 31.7 g/dL (ref 30.0–36.0)
MCV: 93.2 fL (ref 78.0–100.0)
RDW: 16.6 % — ABNORMAL HIGH (ref 11.5–15.5)

## 2011-01-27 DIAGNOSIS — F1020 Alcohol dependence, uncomplicated: Secondary | ICD-10-CM

## 2011-01-27 LAB — BASIC METABOLIC PANEL
BUN: 15 mg/dL (ref 6–23)
Calcium: 10 mg/dL (ref 8.4–10.5)
Creatinine, Ser: 0.48 mg/dL (ref 0.4–1.2)
GFR calc non Af Amer: >60 mL/min (ref >60)
Glucose, Bld: 91 mg/dL (ref 70–99)

## 2011-01-27 NOTE — Group Therapy Note (Signed)
  NAME:  [name], [name]              ACCOUNT NO.:  [id number]  MEDICAL RECORD NO.:  [id number]           PATIENT TYPE:  I  LOCATION:  1506                         [facility]ACILITY[facility]  PHYSICIAN:  Eulogio Ditch, MD DATE OF BIRTH:  [birth year]                                PROGRESS NOTE   HISTORY OF PRESENT ILLNESS: This is a 60 year old female who was seen by me on January 21, 2011.  The patient was admitted after she took extra [facility] powders.  The patient denied at that time that it was a suicide attempt.  We called the daughter and as per her, the patient is following regularly with the psychiatrists and also following regularly with the counselor.  When I saw the patient today, the patient is very logical and goal- directed.  Denies any suicidal or homicidal ideations, is not internally preoccupied.  No psychotic symptoms or manic symptoms present.  The patient again stressed that it was not a suicide attempt.  She just took [facility] powder because she was craving for alcohol, but she did not want to drink alcohol, but there was no intention of killing herself.  The patient expressed that she wants to go home and wants to follow up in the outpatient setting.  Throughout the stay in the hospital, the patient denied any suicidal or homicidal ideations.  No psychotic symptoms were present.  RECOMMENDATIONS: 1. At this time, the patient can be discharged to follow up in the     outpatient setting. 2. Psychoeducation given to the patient regarding alcohol abuse and     compliance in the outpatient setting. 3. Sitter is discontinued at this time.     Eulogio Ditch, MD     SA/MEDQ  D:  01/27/2011  T:  01/27/2011  Job:  [id number]  Electronically Signed by Eulogio Ditch  on 01/27/2011 10:01:13 AM

## 2011-02-02 ENCOUNTER — Emergency Department (HOSPITAL_COMMUNITY): Payer: Medicaid Other

## 2011-02-02 ENCOUNTER — Inpatient Hospital Stay (HOSPITAL_COMMUNITY)
Admission: EM | Admit: 2011-02-02 | Discharge: 2011-02-11 | DRG: 870 | Disposition: A | Discharge status: Home Health | Payer: Medicaid Other | Attending: Pulmonary Disease | Admitting: Pulmonary Disease

## 2011-02-02 DIAGNOSIS — E2749 Other adrenocortical insufficiency: Secondary | ICD-10-CM | POA: Diagnosis present

## 2011-02-02 DIAGNOSIS — R5381 Other malaise: Secondary | ICD-10-CM | POA: Diagnosis not present

## 2011-02-02 DIAGNOSIS — J189 Pneumonia, unspecified organism: Secondary | ICD-10-CM | POA: Diagnosis present

## 2011-02-02 DIAGNOSIS — R6520 Severe sepsis without septic shock: Secondary | ICD-10-CM | POA: Diagnosis present

## 2011-02-02 DIAGNOSIS — J449 Chronic obstructive pulmonary disease, unspecified: Secondary | ICD-10-CM | POA: Diagnosis present

## 2011-02-02 DIAGNOSIS — F329 Major depressive disorder, single episode, unspecified: Secondary | ICD-10-CM | POA: Diagnosis present

## 2011-02-02 DIAGNOSIS — IMO0002 Reserved for concepts with insufficient information to code with codable children: Secondary | ICD-10-CM

## 2011-02-02 DIAGNOSIS — Z79899 Other long term (current) drug therapy: Secondary | ICD-10-CM

## 2011-02-02 DIAGNOSIS — A419 Sepsis, unspecified organism: Principal | ICD-10-CM | POA: Diagnosis present

## 2011-02-02 DIAGNOSIS — R6521 Severe sepsis with septic shock: Secondary | ICD-10-CM

## 2011-02-02 DIAGNOSIS — I5032 Chronic diastolic (congestive) heart failure: Secondary | ICD-10-CM | POA: Diagnosis not present

## 2011-02-02 DIAGNOSIS — F3289 Other specified depressive episodes: Secondary | ICD-10-CM | POA: Diagnosis present

## 2011-02-02 DIAGNOSIS — G9340 Encephalopathy, unspecified: Secondary | ICD-10-CM | POA: Diagnosis present

## 2011-02-02 DIAGNOSIS — F10239 Alcohol dependence with withdrawal, unspecified: Secondary | ICD-10-CM | POA: Diagnosis present

## 2011-02-02 DIAGNOSIS — E039 Hypothyroidism, unspecified: Secondary | ICD-10-CM | POA: Diagnosis not present

## 2011-02-02 DIAGNOSIS — I509 Heart failure, unspecified: Secondary | ICD-10-CM | POA: Diagnosis not present

## 2011-02-02 DIAGNOSIS — F17200 Nicotine dependence, unspecified, uncomplicated: Secondary | ICD-10-CM | POA: Diagnosis present

## 2011-02-02 DIAGNOSIS — J9600 Acute respiratory failure, unspecified whether with hypoxia or hypercapnia: Secondary | ICD-10-CM | POA: Diagnosis present

## 2011-02-02 DIAGNOSIS — F10939 Alcohol use, unspecified with withdrawal, unspecified: Secondary | ICD-10-CM | POA: Diagnosis present

## 2011-02-02 DIAGNOSIS — T380X5A Adverse effect of glucocorticoids and synthetic analogues, initial encounter: Secondary | ICD-10-CM | POA: Diagnosis present

## 2011-02-02 DIAGNOSIS — F1020 Alcohol dependence, uncomplicated: Secondary | ICD-10-CM | POA: Diagnosis present

## 2011-02-02 DIAGNOSIS — J13 Pneumonia due to Streptococcus pneumoniae: Secondary | ICD-10-CM

## 2011-02-02 DIAGNOSIS — R7309 Other abnormal glucose: Secondary | ICD-10-CM | POA: Diagnosis present

## 2011-02-02 DIAGNOSIS — J4489 Other specified chronic obstructive pulmonary disease: Secondary | ICD-10-CM | POA: Diagnosis present

## 2011-02-02 LAB — CBC
HCT: 37.3 % (ref 36.0–46.0)
Hemoglobin: 12.7 g/dL (ref 12.0–15.0)
MCH: 31.1 pg (ref 26.0–34.0)
RBC: 4.09 MIL/uL (ref 3.87–5.11)

## 2011-02-02 LAB — POCT I-STAT 3, ART BLOOD GAS (G3+)
Acid-base deficit: 8 mmol/L — ABNORMAL HIGH (ref 0.0–2.0)
Acid-base deficit: 10 mmol/L — ABNORMAL HIGH (ref 0.0–2.0)
Acid-base deficit: 12 mmol/L — ABNORMAL HIGH (ref 0.0–2.0)
Bicarbonate: 16.4 mEq/L — ABNORMAL LOW (ref 20.0–24.0)
Bicarbonate: 18.5 mEq/L — ABNORMAL LOW (ref 20.0–24.0)
Bicarbonate: 18.2 mEq/L — ABNORMAL LOW (ref 20.0–24.0)
Bicarbonate: 16.3 mEq/L — ABNORMAL LOW (ref 20.0–24.0)
O2 Saturation: 93 %
TCO2: 20 mmol/L (ref 0–100)
pCO2 arterial: 49.7 mmHg — ABNORMAL HIGH (ref 35.0–45.0)
pH, Arterial: 7.172 — CL (ref 7.350–7.400)
pH, Arterial: 7.16 — CL (ref 7.350–7.400)
pO2, Arterial: 87 mmHg (ref 80.0–100.0)
pO2, Arterial: 61 mmHg — ABNORMAL LOW (ref 80.0–100.0)
pO2, Arterial: 62 mmHg — ABNORMAL LOW (ref 80.0–100.0)

## 2011-02-02 LAB — LACTIC ACID, PLASMA: Lactic Acid, Venous: <0.2 mmol/L — ABNORMAL LOW (ref 0.5–2.2)

## 2011-02-02 LAB — URINALYSIS, ROUTINE W REFLEX MICROSCOPIC
Bilirubin Urine: NEGATIVE
Nitrite: NEGATIVE
Specific Gravity, Urine: 1.027 (ref 1.005–1.030)
Urobilinogen, UA: 0.2 mg/dL (ref 0.0–1.0)

## 2011-02-02 LAB — CARDIAC PANEL(CRET KIN+CKTOT+MB+TROPI): Relative Index: INVALID (ref 0.0–2.5)

## 2011-02-02 LAB — POCT CARDIAC MARKERS: Myoglobin, poc: 67.5 ng/mL (ref 12–200)

## 2011-02-02 LAB — GLUCOSE, CAPILLARY: Glucose-Capillary: 124 mg/dL — ABNORMAL HIGH (ref 70–99)

## 2011-02-02 LAB — DIFFERENTIAL
Basophils Relative: 0 % (ref 0–1)
Eosinophils Absolute: 0 10*3/uL (ref 0.0–0.7)
Lymphocytes Relative: 2 % — ABNORMAL LOW (ref 12–46)
Monocytes Relative: 3 % (ref 3–12)
Neutro Abs: 30.8 10*3/uL — ABNORMAL HIGH (ref 1.7–7.7)

## 2011-02-02 LAB — MRSA PCR SCREENING: MRSA by PCR: NEGATIVE

## 2011-02-02 LAB — POCT I-STAT, CHEM 8
Creatinine, Ser: 0.7 mg/dL (ref 0.4–1.2)
Hemoglobin: 13.6 g/dL (ref 12.0–15.0)
Sodium: 141 mEq/L (ref 135–145)
TCO2: 17 mmol/L (ref 0–100)

## 2011-02-02 LAB — PRO B NATRIURETIC PEPTIDE: Pro B Natriuretic peptide (BNP): 515.9 pg/mL — ABNORMAL HIGH (ref 0–125)

## 2011-02-02 LAB — MAGNESIUM: Magnesium: 2.2 mg/dL (ref 1.5–2.5)

## 2011-02-02 LAB — URINE MICROSCOPIC-ADD ON

## 2011-02-02 LAB — COMPREHENSIVE METABOLIC PANEL
ALT: 16 U/L (ref 0–35)
AST: 58 U/L — ABNORMAL HIGH (ref 0–37)
Albumin: 2.6 g/dL — ABNORMAL LOW (ref 3.5–5.2)
Calcium: 8.1 mg/dL — ABNORMAL LOW (ref 8.4–10.5)
GFR calc Af Amer: >60 mL/min (ref >60)
Sodium: 140 mEq/L (ref 135–145)
Total Protein: 6.7 g/dL (ref 6.0–8.3)

## 2011-02-02 LAB — CK TOTAL AND CKMB (NOT AT ARMC)
CK, MB: 1.6 ng/mL (ref 0.3–4.0)
Total CK: 27 U/L (ref 7–177)

## 2011-02-02 LAB — CARBOXYHEMOGLOBIN
Carboxyhemoglobin: 1 % (ref 0.5–1.5)
O2 Saturation: 81.1 %

## 2011-02-02 LAB — TROPONIN I: Troponin I: <0.3 ng/mL (ref <0.30)

## 2011-02-02 LAB — LIPASE, BLOOD: Lipase: 12 U/L (ref 11–59)

## 2011-02-02 LAB — AMYLASE: Amylase: 46 U/L (ref 0–105)

## 2011-02-02 LAB — TYPE AND SCREEN: Antibody Screen: NEGATIVE

## 2011-02-03 ENCOUNTER — Inpatient Hospital Stay (HOSPITAL_COMMUNITY): Payer: Medicaid Other

## 2011-02-03 DIAGNOSIS — I369 Nonrheumatic tricuspid valve disorder, unspecified: Secondary | ICD-10-CM

## 2011-02-03 LAB — POCT I-STAT 3, ART BLOOD GAS (G3+)
O2 Saturation: 92 %
Patient temperature: 98.6
pCO2 arterial: 44.9 mmHg (ref 35.0–45.0)
pH, Arterial: 7.18 — CL (ref 7.350–7.400)
pH, Arterial: 7.425 — ABNORMAL HIGH (ref 7.350–7.400)
pO2, Arterial: 63 mmHg — ABNORMAL LOW (ref 80.0–100.0)

## 2011-02-03 LAB — BASIC METABOLIC PANEL
Calcium: 7.6 mg/dL — ABNORMAL LOW (ref 8.4–10.5)
Creatinine, Ser: <0.47 mg/dL (ref 0.4–1.2)

## 2011-02-03 LAB — LEGIONELLA ANTIGEN, URINE: Legionella Antigen, Urine: NEGATIVE

## 2011-02-03 LAB — CBC
MCHC: 33.7 g/dL (ref 30.0–36.0)
RDW: 17.6 % — ABNORMAL HIGH (ref 11.5–15.5)

## 2011-02-03 LAB — GLUCOSE, CAPILLARY
Glucose-Capillary: 148 mg/dL — ABNORMAL HIGH (ref 70–99)
Glucose-Capillary: 212 mg/dL — ABNORMAL HIGH (ref 70–99)
Glucose-Capillary: 231 mg/dL — ABNORMAL HIGH (ref 70–99)

## 2011-02-03 LAB — HEPATIC FUNCTION PANEL
ALT: 12 U/L (ref 0–35)
Alkaline Phosphatase: 75 U/L (ref 39–117)
Total Bilirubin: 0.2 mg/dL — ABNORMAL LOW (ref 0.3–1.2)
Total Protein: 5.8 g/dL — ABNORMAL LOW (ref 6.0–8.3)

## 2011-02-03 LAB — LIPASE, BLOOD: Lipase: 10 U/L — ABNORMAL LOW (ref 11–59)

## 2011-02-03 NOTE — Discharge Summary (Signed)
NAME:  [name] [name], [name] [name]              ACCOUNT NO.:  [id number]  MEDICAL RECORD NO.:  [id number]           PATIENT TYPE:  I  LOCATION:  1506                         FACILITY:  [facility]  PHYSICIAN:  Ramiro Harvest, MD    DATE OF BIRTH:  1950/11/15  DATE OF ADMISSION:  01/20/2011 DATE OF DISCHARGE:                                DISCHARGE SUMMARY   PRIMARY CARE PHYSICIAN:  Windle Guard, M.D.  DISCHARGE DIAGNOSES: 1. Salicylate intoxication/suicidal ideation, improved. 2. Hypothyroidism. 3. Bilateral lower extremity weakness, likely secondary to     hypothyroidism and debility, improved. 4. Major depressive disorder. 5. Mild acute chronic obstructive pulmonary disease exacerbation,     improved. 6. Chronic alcohol abuse. 7. Hypokalemia, resolved. 8. Status post hysterectomy. 9. Chronic tobacco use. 10.Hypertension.  DISCHARGE MEDICATIONS:  1. Advair Diskus 250/50 one puff twice daily. 2. Albuterol inhaler 2 puffs 3 times daily x4 days, then q.4 h. as     needed. 3. Tylenol 650 mg p.o. q.4 h. p.r.n. 4. Tessalon Perles 200 mg p.o. t.i.d. x4 days. 5. Levaquin 500 mg p.o. daily x4 days. 6. Folic acid 1 mg p.o. daily. 7. Synthroid 25 mcg p.o. daily. 8. Multivitamin 1 tablet p.o. daily. 9. Prednisone 60 mg p.o. daily x1 day, then 40 mg p.o. daily x2 days,     then 20 mg p.o. daily x2 days, then stop. 10.Spiriva 18 mcg 1 inhalation daily. 11.Thiamine 100 mg p.o. daily. 12.Zoloft 100 mg p.o. q.a.m. FINAL D/C MEDICATIONS TO BE DICTATED PER DISCHARGING PHYSICIAN. DISPOSITION AND FOLLOWUP:  The patient will be discharged to [facility].  Once the patient is discharged from [facility], she will need to follow up with her PCP, Dr. Jeannetta [name], 1-2 weeks post discharge.  On followup with her PCP, the patient will need her hypothyroidism reassessed.  She will need repeat thyroid function studies done in the next 4-6 weeks.  She will also need followup on her COPD.  The  patient has been started on Advair Diskus as well as Spiriva and albuterol inhaler during this hospitalization and may benefit from a pulmonary referral from her PCP.  CONSULTATIONS DONE:  Psychiatric consultation was done.  The patient was seen in consultation by Dr. Rogers Blocker of Psychiatry on January 21, 2011.  PROCEDURES PERFORMED: 1. A chest x-ray was done on January 20, 2011 that showed heart size in     the upper limits of normal, otherwise negative. 2. An MRI of the head was done on January 21, 2011 that showed normal     appearance of the brain itself, right maxillary sinus mucosal     inflammation.  ADMISSION HISTORY AND PHYSICAL:  Ms. [name] [name] is a 60 year old Caucasian female with history of chronic alcohol use and major depressive disorder who was brought to the ED because the patient was stated to have taken some salicylates at home dose and verified according to the ED physician.  However, when the patient was seen by the admitting physician, she complained of lower extremity weakness of unspecified duration.  Denied any history of falls.  No chest pain, no shortness of breath, no fever, no  chills, no rigors.  For the rest of admission history and physical, please see H and P dictated by Dr. Beverly Gust of job [phone].  HOSPITAL COURSE: 1. Salicylate intoxication/suicidal ideation.  The patient was     admitted with salicylate intoxication/suicidal ideation.  The     patient on followup did state that she was trying to hurt herself     by taking several salicylates and was unsure of how much she had     taken.  A salicylate level which was obtained on day of admission     was elevated at 36.5.  The patient was placed on tele.  She was     monitored and followed.  Salicylate levels were followed.  She was     placed on supportive care with hydration, IV fluids and a banana     bag and monitored.  Her salicylate levels were followed, improved     and were less than 0.40.   By January 22, 2011, the patient improved     clinically.  During the hospitalization, she was monitored, placed     on IV fluids and maintained on supportive care.  Psych consultation     was obtained.  The patient was seen in consultation by Dr.     Rogers Blocker on January 21, 2011.  At that time, it was felt that the     patient was also in recurrent major depressive disorder.  It was     recommended to continue patient's Zoloft and monitor the patient.     It was felt that the patient would benefit from an inpatient     psychiatric unit once the patient is medically stable.  The patient     was monitored and followed, improved clinically during the     hospitalization and the patient is currently medically stable for     discharge to [facility] for further management of her     major depressive disorder and suicidal attempt.  The patient was     discharged in stable condition. 2. Hypothyroidism.  During the workup of the patient's lower extremity     weakness, TSH was obtained.  TSH came back at 0.620.  T3 which was     obtained was decreased at 11.8.  Free T4 which was also obtained     was decreased at 0.51.  It was felt the patient likely did have     hypothyroidism.  She was placed on Synthroid and started on 25 mcg     daily.  The patient did have some improvement in her lower     extremity weakness.  The patient was discharged home on Synthroid     25 mcg daily and will follow up with her PCP as outpatient.  The     patient was discharged in stable and improved condition. 3. Bilateral lower extremity weakness.  On admission, the patient was     noted to have complaints of lower extremity weakness of unknown     duration.  It was felt this could potentially be secondary to     hypothyroidism as TSH, free T4 and T3 results were obtained as     stated in problem #2.  The patient was also seen by PT, OT and     improved clinically during the hospitalization with PT, OT and      thyroid replacement.  MRI of the head which was obtained was     negative for any  acute abnormalities.  The patient improved     clinically and was discharged in stable and improved condition. 4. Major depressive disorder.  The patient was seen by Psychiatry as     stated above for major depressive disorder which was recurrent.  It     was recommended to continue the patient on a home dose of Zoloft.     She was followed by Psychiatry during the hospitalization and will     be discharged to [facility] for further management of     her depression. 5. Mild acute COPD exacerbation.  During the hospitalization, the     patient did have some bouts of wheezing.  It was felt that the     patient was in acute COPD exacerbation.  She was placed on oxygen     and nebulizer treatments and was started on IV Levaquin and     transitioned to oral Levaquin.  She was initially started on IV     Solu-Medrol and this was tapered down to oral steroids.  The     patient improved clinically.  She will be discharged in stable and     improved condition on Levaquin for 4 more days, Tessalon Perles for     4 more days, albuterol inhaler 3 times daily x4 days and then on as-     needed basis as well.  The patient will be discharged in stable and     improved condition and will follow up with her PCP as an     outpatient.  The patient may benefit from Pulmonary referral as an     outpatient. 6. Chronic alcohol abuse.  The patient was noted to have a history of     chronic alcohol abuse.  The patient was placed on Ativan CIWA     protocol.  The patient did not experience any DTs or withdrawal     during this hospitalization.  She will be discharged in stable     condition. 7. Hypokalemia.  During the hospitalization, the patient was noted to     be hypokalemic.  The patient's potassium was repleted prior to     discharge.  DISCHARGE LABORATORY DATA:  Magnesium 1.9.  BMET; sodium 142,  potassium 3.9, chloride 105, bicarb 30, glucose 143, BUN 10, creatinine 0.44 and a calcium of 9.4. DATE OF DISCHARGE AND FINAL DISCHARGE MEDICATIONS AND DISPOSITION TO BE DICTATED PER DISCHARGING PHYSICIAN.  It has been a pleasure taking care of Ms. Higuchi.     Ramiro Harvest, MD     DT/MEDQ  D:  01/25/2011  T:  01/25/2011  Job:  [id number]  cc:   Windle Guard, M.D. Fax: [phone]  Eulogio Ditch, MD  Electronically Signed by Ramiro Harvest MD on 02/03/2011 02:55:52 PM

## 2011-02-04 ENCOUNTER — Other Ambulatory Visit: Payer: Self-pay | Admitting: Pulmonary Disease

## 2011-02-04 ENCOUNTER — Inpatient Hospital Stay (HOSPITAL_COMMUNITY): Payer: Medicaid Other

## 2011-02-04 LAB — COMPREHENSIVE METABOLIC PANEL
AST: 30 U/L (ref 0–37)
CO2: 32 mEq/L (ref 19–32)
Chloride: 103 mEq/L (ref 96–112)
Creatinine, Ser: <0.47 mg/dL (ref 0.4–1.2)
Total Bilirubin: 0.5 mg/dL (ref 0.3–1.2)

## 2011-02-04 LAB — POCT I-STAT 3, ART BLOOD GAS (G3+)
Bicarbonate: 31.4 mEq/L — ABNORMAL HIGH (ref 20.0–24.0)
O2 Saturation: 94 %
TCO2: 32 mmol/L (ref 0–100)
pCO2 arterial: 35.8 mmHg (ref 35.0–45.0)
pO2, Arterial: 65 mmHg — ABNORMAL LOW (ref 80.0–100.0)

## 2011-02-04 LAB — BASIC METABOLIC PANEL
CO2: 34 mEq/L — ABNORMAL HIGH (ref 19–32)
CO2: 32 mEq/L (ref 19–32)
Calcium: 8.6 mg/dL (ref 8.4–10.5)
Chloride: 103 mEq/L (ref 96–112)
Chloride: 104 mEq/L (ref 96–112)
Creatinine, Ser: <0.47 mg/dL (ref 0.4–1.2)
Creatinine, Ser: <0.47 mg/dL (ref 0.4–1.2)
Glucose, Bld: 126 mg/dL — ABNORMAL HIGH (ref 70–99)
Potassium: 3.1 mEq/L — ABNORMAL LOW (ref 3.5–5.1)

## 2011-02-04 LAB — GLUCOSE, CAPILLARY
Glucose-Capillary: 147 mg/dL — ABNORMAL HIGH (ref 70–99)
Glucose-Capillary: 180 mg/dL — ABNORMAL HIGH (ref 70–99)
Glucose-Capillary: 149 mg/dL — ABNORMAL HIGH (ref 70–99)

## 2011-02-04 LAB — PROTIME-INR: Prothrombin Time: 16.5 seconds — ABNORMAL HIGH (ref 11.6–15.2)

## 2011-02-04 LAB — BLOOD GAS, ARTERIAL
Bicarbonate: 33.6 mEq/L — ABNORMAL HIGH (ref 20.0–24.0)
FIO2: 0.5 %
O2 Saturation: 85.4 %
Patient temperature: 98.6
TCO2: 35.2 mmol/L (ref 0–100)

## 2011-02-04 LAB — CBC
Hemoglobin: 8.8 g/dL — ABNORMAL LOW (ref 12.0–15.0)
MCH: 29.7 pg (ref 26.0–34.0)
MCV: 90.5 fL (ref 78.0–100.0)
RBC: 2.96 MIL/uL — ABNORMAL LOW (ref 3.87–5.11)

## 2011-02-04 LAB — URINE CULTURE

## 2011-02-05 ENCOUNTER — Inpatient Hospital Stay (HOSPITAL_COMMUNITY): Payer: Medicaid Other

## 2011-02-05 LAB — GLUCOSE, CAPILLARY

## 2011-02-05 LAB — COMPREHENSIVE METABOLIC PANEL
ALT: 13 U/L (ref 0–35)
AST: 37 U/L (ref 0–37)
Albumin: 2 g/dL — ABNORMAL LOW (ref 3.5–5.2)
Alkaline Phosphatase: 73 U/L (ref 39–117)
Chloride: 105 mEq/L (ref 96–112)
Potassium: 3.5 mEq/L (ref 3.5–5.1)
Total Bilirubin: 0.4 mg/dL (ref 0.3–1.2)

## 2011-02-05 LAB — POCT I-STAT 3, ART BLOOD GAS (G3+)
Bicarbonate: 33.5 mEq/L — ABNORMAL HIGH (ref 20.0–24.0)
Patient temperature: 37
TCO2: 35 mmol/L (ref 0–100)
pH, Arterial: 7.435 — ABNORMAL HIGH (ref 7.350–7.400)
pO2, Arterial: 62 mmHg — ABNORMAL LOW (ref 80.0–100.0)

## 2011-02-05 LAB — CBC
HCT: 27.9 % — ABNORMAL LOW (ref 36.0–46.0)
MCHC: 32.3 g/dL (ref 30.0–36.0)
MCV: 93 fL (ref 78.0–100.0)
Platelets: 189 10*3/uL (ref 150–400)
RDW: 17.8 % — ABNORMAL HIGH (ref 11.5–15.5)
WBC: 17.5 10*3/uL — ABNORMAL HIGH (ref 4.0–10.5)

## 2011-02-06 ENCOUNTER — Inpatient Hospital Stay (HOSPITAL_COMMUNITY): Payer: Medicaid Other

## 2011-02-06 LAB — GLUCOSE, CAPILLARY
Glucose-Capillary: 114 mg/dL — ABNORMAL HIGH (ref 70–99)
Glucose-Capillary: 186 mg/dL — ABNORMAL HIGH (ref 70–99)

## 2011-02-06 LAB — MAGNESIUM: Magnesium: 2 mg/dL (ref 1.5–2.5)

## 2011-02-06 LAB — POCT I-STAT 3, ART BLOOD GAS (G3+)
Acid-Base Excess: 18 mmol/L — ABNORMAL HIGH (ref 0.0–2.0)
Bicarbonate: 43.3 mEq/L — ABNORMAL HIGH (ref 20.0–24.0)
O2 Saturation: 100 %
Patient temperature: 98.6
TCO2: 45 mmol/L (ref 0–100)
pCO2 arterial: 53.6 mmHg — ABNORMAL HIGH (ref 35.0–45.0)
pH, Arterial: 7.515 — ABNORMAL HIGH (ref 7.350–7.400)
pO2, Arterial: 68 mmHg — ABNORMAL LOW (ref 80.0–100.0)

## 2011-02-06 LAB — CULTURE, BAL-QUANTITATIVE W GRAM STAIN
Colony Count: NO GROWTH
Culture: NO GROWTH
Gram Stain: NONE SEEN

## 2011-02-06 LAB — CBC
HCT: 29.2 % — ABNORMAL LOW (ref 36.0–46.0)
MCH: 30.1 pg (ref 26.0–34.0)
MCV: 92.4 fL (ref 78.0–100.0)
Platelets: 209 10*3/uL (ref 150–400)
RBC: 3.16 MIL/uL — ABNORMAL LOW (ref 3.87–5.11)

## 2011-02-06 LAB — BASIC METABOLIC PANEL
BUN: 9 mg/dL (ref 6–23)
Chloride: 96 mEq/L (ref 96–112)
Creatinine, Ser: <0.47 mg/dL (ref 0.4–1.2)
Glucose, Bld: 137 mg/dL — ABNORMAL HIGH (ref 70–99)

## 2011-02-07 ENCOUNTER — Inpatient Hospital Stay (HOSPITAL_COMMUNITY): Payer: Medicaid Other

## 2011-02-07 DIAGNOSIS — K7200 Acute and subacute hepatic failure without coma: Secondary | ICD-10-CM

## 2011-02-07 LAB — POCT I-STAT 3, ART BLOOD GAS (G3+)
Acid-Base Excess: 8 mmol/L — ABNORMAL HIGH (ref 0.0–2.0)
Bicarbonate: 35.2 mEq/L — ABNORMAL HIGH (ref 20.0–24.0)
Bicarbonate: 32.7 mEq/L — ABNORMAL HIGH (ref 20.0–24.0)
Patient temperature: 98.6
pCO2 arterial: 47.8 mmHg — ABNORMAL HIGH (ref 35.0–45.0)
pH, Arterial: 7.472 — ABNORMAL HIGH (ref 7.350–7.400)
pH, Arterial: 7.447 — ABNORMAL HIGH (ref 7.350–7.400)
pO2, Arterial: 57 mmHg — ABNORMAL LOW (ref 80.0–100.0)

## 2011-02-07 LAB — BASIC METABOLIC PANEL
BUN: 13 mg/dL (ref 6–23)
CO2: 37 mEq/L — ABNORMAL HIGH (ref 19–32)
CO2: 35 mEq/L — ABNORMAL HIGH (ref 19–32)
Calcium: 8.8 mg/dL (ref 8.4–10.5)
Chloride: 98 mEq/L (ref 96–112)
Chloride: 98 mEq/L (ref 96–112)
Creatinine, Ser: <0.47 mg/dL (ref 0.4–1.2)
Glucose, Bld: 147 mg/dL — ABNORMAL HIGH (ref 70–99)
Potassium: 2.4 mEq/L — CL (ref 3.5–5.1)
Sodium: 143 mEq/L (ref 135–145)
Sodium: 143 mEq/L (ref 135–145)

## 2011-02-07 LAB — CBC
HCT: 30.1 % — ABNORMAL LOW (ref 36.0–46.0)
Hemoglobin: 9.6 g/dL — ABNORMAL LOW (ref 12.0–15.0)
MCHC: 31.9 g/dL (ref 30.0–36.0)
RDW: 16.9 % — ABNORMAL HIGH (ref 11.5–15.5)
WBC: 13.1 10*3/uL — ABNORMAL HIGH (ref 4.0–10.5)

## 2011-02-07 LAB — GLUCOSE, CAPILLARY
Glucose-Capillary: 122 mg/dL — ABNORMAL HIGH (ref 70–99)
Glucose-Capillary: 140 mg/dL — ABNORMAL HIGH (ref 70–99)
Glucose-Capillary: 132 mg/dL — ABNORMAL HIGH (ref 70–99)

## 2011-02-07 LAB — MAGNESIUM: Magnesium: 2.2 mg/dL (ref 1.5–2.5)

## 2011-02-07 LAB — PHOSPHORUS: Phosphorus: 3.3 mg/dL (ref 2.3–4.6)

## 2011-02-07 NOTE — Discharge Summary (Signed)
NAME:  [name] [name], [name] [name]              ACCOUNT NO.:  [id number]  MEDICAL RECORD NO.:  [id number]           PATIENT TYPE:  I  LOCATION:  1506                         FACILITY:  [facility]  PHYSICIAN:  Marcellus Scott, MD     DATE OF BIRTH:  1951/06/12  DATE OF ADMISSION:  01/20/2011 DATE OF DISCHARGE:  01/27/2011                              DISCHARGE SUMMARY   ADDENDUM:  PRIMARY CARE PHYSICIAN:  Windle Guard, M.D.  This is an addendum discharge summary and will update events in this patient's care since January 25, 2011.  It will also update discharge medications.  For events prior to January 25, 2011, please refer to the discharge summary that was dictated by Dr. Ramiro Harvest on January 25, 2011, with job number [id number].  Please forward that copy also to the primary care physician Dr. Windle Guard.  DISCHARGE DIAGNOSIS:  Same as the previous discharge summary except the patient does not have clinical hypothyroidism.  She does have low normal TSH in the context of low free T3 and free T4.  These abnormal thyroid function tests will need to be followed up as an outpatient in 4 to 6 weeks' time.  DISCHARGE MEDICATIONS: 1. Advair discus 250/50 one puff inhaled b.i.d. 2. Acetaminophen 650 mg p.o. q.4 hourly p.r.n. for pain. 3. Albuterol HFA 2 puffs inhaled q.4 hourly p.r.n. for dyspnea or     wheezing. 4. Folic acid 1 mg daily p.o. 5. Multivitamins 1 tablet p.o. daily. 6. Prednisone taper as per directions. 7. Spiriva 18 mcg inhaled daily. 8. Thiamine 100 mg p.o. daily. 9. Zoloft 100 mg p.o. daily.  DISCONTINUED MEDICATION:  Goody's powder   Laboratory data, basic metabolic panel today within normal limits. Potassium is 4.2, BUN 15, creatinine 0.48.  CONSULTATIONS:  Psychiatry, Dr. Eulogio Ditch.  DIET:  Heart healthy diet.  ACTIVITY:  As tolerated.  Complaints today, none.  The patient denies any dyspnea or wheezing and she has occasional cough which is nonproductive.   She is ambulant with the help of a walker at times and she is steady  PHYSICAL EXAMINATION:  GENERAL:  The patient is in no obvious distress. VITAL SIGNS:  Temperature 97.8 degrees Fahrenheit, pulse 71 minute, respirations 18 per minute, blood pressure 131/83 mmHg, and saturating 94% on room air. RESPIRATORY SYSTEM:  Clear.  No increased work of breathing. CARDIOVASCULAR SYSTEM:  First and second heart sounds heard, regular. No JVD. ABDOMEN:  Nondistended, nontender, soft and bowel sounds present. CENTRAL NERVOUS SYSTEM:  The patient is awake, alert, oriented x3 with no focal neurological deficit. PSYCHIATRIC:  The patient is pleasant and cooperative with no suicidal or homicidal ideations or delusions or hallucinations.  HOSPITAL COURSE:  Since the January 25, 2011, the patient was essentially awaiting transfer to the [facility] once she was able to come off oxygen.  In the interim, she has made improvement and denies any dyspnea or chest pain or cough and her hypoxemia has resolved.  This was possibly from mild acute exacerbation of COPD.  Psychiatrist has evaluated her today and indicates that she does not need inpatient psychiatric care anymore and have  cleared her for discharge home with outpatient followup with Psychiatry.  DISPOSITION:  The patient is discharged home in stable condition.  FOLLOWUP RECOMMENDATIONS.: 1. With Dr. Jeannetta Nap in 1 to 2 weeks. 2. With [facility] with Dr. Rosalia Hammers.  The patient has an appointment     for Jan 28, 2011, at 9:30 a.m. 3. Also followup of repeat thyroid function tests in 4 to 6 weeks from     discharge.  Time taken in coordinating this discharge is 30 minutes.     Marcellus Scott, MD     AH/MEDQ  D:  01/27/2011  T:  01/27/2011  Job:  [id number]  cc:   Windle Guard, M.D. Fax: [phone]  Electronically Signed by Marcellus Scott MD on 02/07/2011 06:19:47 PM

## 2011-02-08 ENCOUNTER — Inpatient Hospital Stay (HOSPITAL_COMMUNITY): Payer: Medicaid Other

## 2011-02-08 DIAGNOSIS — J13 Pneumonia due to Streptococcus pneumoniae: Secondary | ICD-10-CM

## 2011-02-08 DIAGNOSIS — R6521 Severe sepsis with septic shock: Secondary | ICD-10-CM

## 2011-02-08 DIAGNOSIS — A419 Sepsis, unspecified organism: Secondary | ICD-10-CM

## 2011-02-08 DIAGNOSIS — J9600 Acute respiratory failure, unspecified whether with hypoxia or hypercapnia: Secondary | ICD-10-CM

## 2011-02-08 LAB — CBC
HCT: 32.2 % — ABNORMAL LOW (ref 36.0–46.0)
Hemoglobin: 10.4 g/dL — ABNORMAL LOW (ref 12.0–15.0)
MCH: 29.7 pg (ref 26.0–34.0)
MCV: 92 fL (ref 78.0–100.0)
RBC: 3.5 MIL/uL — ABNORMAL LOW (ref 3.87–5.11)

## 2011-02-08 LAB — COMPREHENSIVE METABOLIC PANEL
ALT: 59 U/L — ABNORMAL HIGH (ref 0–35)
AST: 73 U/L — ABNORMAL HIGH (ref 0–37)
CO2: 32 mEq/L (ref 19–32)
Chloride: 101 mEq/L (ref 96–112)
Creatinine, Ser: <0.47 mg/dL (ref 0.4–1.2)
Glucose, Bld: 93 mg/dL (ref 70–99)
Total Bilirubin: 0.4 mg/dL (ref 0.3–1.2)

## 2011-02-08 LAB — BASIC METABOLIC PANEL
BUN: 16 mg/dL (ref 6–23)
Calcium: 8.9 mg/dL (ref 8.4–10.5)
Creatinine, Ser: 0.61 mg/dL (ref 0.4–1.2)
GFR calc non Af Amer: >60 mL/min (ref >60)
Glucose, Bld: 89 mg/dL (ref 70–99)

## 2011-02-08 LAB — CULTURE, BLOOD (ROUTINE X 2): Culture  Setup Time: 201205082111

## 2011-02-08 LAB — POCT I-STAT 3, ART BLOOD GAS (G3+)
Bicarbonate: 31.2 mEq/L — ABNORMAL HIGH (ref 20.0–24.0)
O2 Saturation: 90 %
Patient temperature: 98.6
TCO2: 32 mmol/L (ref 0–100)
pH, Arterial: 7.511 — ABNORMAL HIGH (ref 7.350–7.400)

## 2011-02-08 LAB — GLUCOSE, CAPILLARY
Glucose-Capillary: 121 mg/dL — ABNORMAL HIGH (ref 70–99)
Glucose-Capillary: 96 mg/dL (ref 70–99)

## 2011-02-08 LAB — PROTIME-INR: INR: 1.2 (ref 0.00–1.49)

## 2011-02-09 ENCOUNTER — Inpatient Hospital Stay (HOSPITAL_COMMUNITY): Payer: Medicaid Other

## 2011-02-09 LAB — GLUCOSE, CAPILLARY
Glucose-Capillary: 108 mg/dL — ABNORMAL HIGH (ref 70–99)
Glucose-Capillary: 133 mg/dL — ABNORMAL HIGH (ref 70–99)

## 2011-02-09 LAB — CBC
HCT: 31 % — ABNORMAL LOW (ref 36.0–46.0)
Hemoglobin: 10.1 g/dL — ABNORMAL LOW (ref 12.0–15.0)
MCH: 29.6 pg (ref 26.0–34.0)
MCHC: 32.6 g/dL (ref 30.0–36.0)
MCV: 90.9 fL (ref 78.0–100.0)

## 2011-02-09 LAB — BASIC METABOLIC PANEL
CO2: 27 mEq/L (ref 19–32)
Calcium: 9.1 mg/dL (ref 8.4–10.5)
Creatinine, Ser: <0.47 mg/dL (ref 0.4–1.2)
Glucose, Bld: 111 mg/dL — ABNORMAL HIGH (ref 70–99)
Sodium: 141 mEq/L (ref 135–145)

## 2011-02-10 DIAGNOSIS — J441 Chronic obstructive pulmonary disease with (acute) exacerbation: Secondary | ICD-10-CM

## 2011-02-10 DIAGNOSIS — J13 Pneumonia due to Streptococcus pneumoniae: Secondary | ICD-10-CM

## 2011-02-10 DIAGNOSIS — J9600 Acute respiratory failure, unspecified whether with hypoxia or hypercapnia: Secondary | ICD-10-CM

## 2011-02-10 DIAGNOSIS — G9340 Encephalopathy, unspecified: Secondary | ICD-10-CM

## 2011-02-10 LAB — BASIC METABOLIC PANEL
CO2: 21 mEq/L (ref 19–32)
Chloride: 105 mEq/L (ref 96–112)
Creatinine, Ser: <0.47 mg/dL (ref 0.4–1.2)

## 2011-02-10 LAB — GLUCOSE, CAPILLARY
Glucose-Capillary: 85 mg/dL (ref 70–99)
Glucose-Capillary: 96 mg/dL (ref 70–99)
Glucose-Capillary: 109 mg/dL — ABNORMAL HIGH (ref 70–99)
Glucose-Capillary: 97 mg/dL (ref 70–99)

## 2011-02-10 LAB — BLOOD GAS, ARTERIAL
Bicarbonate: 24.4 mEq/L — ABNORMAL HIGH (ref 20.0–24.0)
TCO2: 25.5 mmol/L (ref 0–100)
pCO2 arterial: 35.3 mmHg (ref 35.0–45.0)
pH, Arterial: 7.454 — ABNORMAL HIGH (ref 7.350–7.400)
pO2, Arterial: 77.6 mmHg — ABNORMAL LOW (ref 80.0–100.0)

## 2011-02-10 LAB — PHOSPHORUS: Phosphorus: 2.3 mg/dL (ref 2.3–4.6)

## 2011-02-11 ENCOUNTER — Inpatient Hospital Stay (HOSPITAL_COMMUNITY): Payer: Medicaid Other

## 2011-02-11 LAB — GLUCOSE, CAPILLARY: Glucose-Capillary: 87 mg/dL (ref 70–99)

## 2011-02-11 LAB — BASIC METABOLIC PANEL
Calcium: 9.3 mg/dL (ref 8.4–10.5)
Creatinine, Ser: <0.47 mg/dL (ref 0.4–1.2)
Sodium: 140 mEq/L (ref 135–145)

## 2011-02-11 LAB — PRO B NATRIURETIC PEPTIDE: Pro B Natriuretic peptide (BNP): 133.7 pg/mL — ABNORMAL HIGH (ref 0–125)

## 2011-02-15 NOTE — Discharge Summary (Addendum)
NAME:  [name] [name], [name] [name]              ACCOUNT NO.:  [id number]  MEDICAL RECORD NO.:  [id number]           PATIENT TYPE:  I  LOCATION:  4741                         FACILITY:  [facility]  PHYSICIAN:  [name] Helling, MD        DATE OF BIRTH:  1951-01-03  DATE OF ADMISSION:  02/02/2011 DATE OF DISCHARGE:  02/11/2011                              DISCHARGE SUMMARY   DISCHARGE DIAGNOSES: 1. Hypoxic respiratory failure secondary to pneumonia. 2. Altered mental status/ethyl alcohol (consumption, dependency)     withdrawal. 3. Systemic inflammatory response syndrome. 4. Chronic obstructive pulmonary disease/tobacco abuse. 5. Diastolic congestive heart failure/hypertension. 6. Depression. 7. Steroid-induced hyperglycemia.  LINES/TUBES: 1. Oral endotracheal tube from May 8 to Feb 07, 2011. 2. Left internal jugular central line from Feb 02, 2011, and has since     been removed. 3. Arterial line from May 8 to Feb 07, 2011.  MICRO DATA:  Feb 02, 2011, urine Legionella is negative.  Feb 02, 2011, urine culture is negative.  Feb 03, 2011, bronchial alveolar lavage for acid-fast bacilli was negative on preliminary results.  Final results pending.  Feb 03, 2011, BAL demonstrates no growth.  Feb 02, 2011, BAL demonstrates no growth.  Blood cultures on Feb 02, 2011, x2 demonstrates no growth and BAL for yeast or fungal elements are negative on preliminary results.  Final results pending.  Feb 02, 2011, MRSA/PCR is negative.  Feb 02, 2011, strep pneumoniae of the urine is negative.  ANTIBIOTIC DATA: 1. Vancomycin for 518-514. 2. Primaxin from May 8 to Feb 09, 2011. 3. Azithromycin from Feb 02, 2011, and has since been stopped.  BEST PRACTICE:  The patient was placed on proton pump inhibitor for stress ulcer prophylaxis and sequential compression devices for DVT prevention.  PROTOCOL/CONSULTANTS: 1. Feb 02, 2011, ARDS protocol. 2. Feb 02, 2011, sedation protocol. 3. Feb 02, 2011, sepsis protocol.  KEY  EVENTS/STUDIES: 1. Feb 02, 2011, CT of the head was negative. 2. CT of the chest demonstrates diffuse pulmonary infiltrates. 3. On Feb 08, 2011, the patient had soft blood pressures secondary to     over diuresis and required fluid bolus. 4. Feb 09, 2011, the patient remained in a negative balance and did     require volume repletion with resolution of low blood pressures.  HISTORY OF PRESENT ILLNESS:  [name] is a 60 year old white female with a longstanding history of alcohol abuse, hypothyroidism, depressive disorder, and chronic tobacco abuse who presented to [facility] Emergency Department on Feb 02, 2011, in frank respiratory distress with a pO2 of 48, requiring 100% non-rebreather.  She was emergently intubated and remained on mechanical ventilation from May 8 until Feb 07, 2011, secondary to pulmonary infiltrates noted on CT of the chest. She was placed on IV antibiotics to include vancomycin, Primaxin and azithromycin in the setting of her recent hospitalization.  She did require sepsis protocol with early goal-directed therapy.  Culture data has been negative to date.  Please see above microsection for details.  Initially, she did require Levophed to maintain mean arterial pressure greater than 65.  She was placed on stress  dose steroids and was pancultured.  In the setting of her mild COPD, she was supported with an ARDS protocol bronchodilators and was serially followed with daily chest x-rays.  [name] made progress and was able to be liberated from the ventilator on Feb 07, 2011.  She required no further pulmonary interventions.  During hospital course secondary to initial presentation of altered mental status, she did undergo a CT of the head which was negative.  She was placed on thiamine, multivitamin and folic acid during hospitalization and the CIWA protocol to prevent alcohol withdrawal.  Steroids were tapered to a p.o. prednisone taper as she improved and she  will continue on this postdischarge.  She did undergo a swallow evaluation post extubation and initially required a dysphagia III diet with thin liquids.  She did progress during hospitalization to advancement of regular diet without signs and symptoms of aspiration.  She did undergo pulmonary function tests during hospitalization which demonstrated an FEV-1 to FVC ratio of 67%.  Prior to PFTs, she did have an albuterol neb and a Spiriva dosing 1 hour prior to test.  Pulmonary function tests were consistent with mineral obstructive airway disease.  [name] was also evaluated by speech therapy and occupational therapy during hospitalization and at time of discharge [name] has no physical therapy needs.  Prior to hospitalization, [name] denies previous hypertension medications. Her blood pressures have been normotensive post ICU stay.  At time of discharge, she will not have any new initiation of blood pressure medications and recommended follow up blood pressure at time of office visit with primary care provider.  HOSPITAL COURSE BY DISCHARGE DIAGNOSES: 1. Hypoxic respiratory failure secondary to pneumonia.  As per HPI,    [name] was admitted on Feb 02, 2011, in acute hypoxic     respiratory failure in the setting of pneumonia.  She did have a CT     of the chest which demonstrated bilateral pulmonary infiltrates.     She was placed on IV antibiotics and emergently intubated and     admitted to the Intensive Care Unit.  She was maintained on     mechanical ventilation until Feb 07, 2011, at which time, she was     liberated from the ventilator without further pulmonary     interventions.  Initially, she was in shock and did require     vasoactive medications for blood pressure support as well as stress     dose steroids.  Currently, she is being transitioned to p.o.     prednisone taper and will continue to taper this to off.  She has     completed her IV antibiotics  during hospitalization and no further     antibiotics warranted at this time. 2. Altered mental status/EtOH withdrawal.  [name] did present     with altered mental status, felt that she likely did have alcohol     withdrawal initially.  She was sedated and maintained on mechanical     ventilation as above.  At time of discharge, she does appear to be     at a normal baseline mental status with full mental faculties.  It     was discussed at time of discharge regarding need for cessation of     alcohol consumption and possible avenues of support. 3. SIRS in the setting of acute pneumonia.  [name] was initially     during hospital course placed on early goal-directed therapy with a  septic shock protocol.  She was maintained on vasoactive agents to     achieve a normal MAP.  She was aggressively hydrated and placed on     stress dose steroids as well as IV antibiotics to include     vancomycin, Primaxin and azithromycin.  She has completed all IV     antibiotics.  LABORATORY DATA: 1. Currently, within normal limits.  BMET demonstrates sodium 140,     potassium 3.3, chloride 104, CO2 25, glucose 83, BUN 14, creatinine     less than 0.47 and calcium 9.3. 2. COPD/tobacco abuse.  [name] is a known continued tobacco    abuser.  During hospitalization, she likely did have a component of     COPD exacerbation and is completing a prednisone taper.  She did     have PFTs during hospitalization which demonstrated an FVC of 2.49,     FEV-1 to FVC ratio of 67.  PFTs consistent with minimal obstructive     airway disease. 3. Diastolic CHF/hypertension.  As above, Ms. Worm apparently does     have a known history of elevated pressures in the past.  However,     she indicates at this time that she was not on a blood pressure     medicines prior to presentation.  Initially during hospital course,     she was hypotensive requiring assistance from blood pressure     medications for  normal MAP.  At this time, she is normotensive and     does not require any blood pressure medications at time of     discharge.  She did undergo a 2-D echo during hospitalization which     demonstrated normal systolic function with an estimated EF of 60-     65%.  She had normal wall motion.  She did have a noted pseudo     normal left ventricular filling pattern with abnormal relaxation     and increased filling pressures consistent with grade 2 diastolic     dysfunction.  Noted pulmonary artery pressures, peak pressure of     67.  It is recommended that she have serial follow up by primary     care provider post hospitalization for further blood pressure     needs. 4. Depression.  Prior to hospital admit, Ms. Kelleher was on Zoloft.     She will continue on this postdischarge. 5. Steroid-induced hyperglycemia.  In the setting of adrenal     insufficiency, she did require IV Solu-Medrol and has been     transitioned to prednisone taper.  She will continue on this     postdischarge until taper completion.  DISCHARGE INSTRUCTIONS: 1. Diet as tolerated, recommended heart-healthy. 2. Activity.  Activity as tolerated. 3. followup.  She is scheduled to follow with Dr. Jeannetta Nap on Thursday     Feb 18, 2011, at 9 a.m. and Dr. Marchelle Gearing at [facility] on     February 26, 2011, at 3 p.m.  DISCHARGE MEDICATIONS: 1. Prednisone 10 mg tabs 3 tabs daily for 3 days, then 2 tabs daily     for 3 days, then 1 tab daily for 3 days and stop. 2. Tylenol 650 mg every 4 h. as needed for pain. 3. Albuterol HFA 2 puffs inhaled every 4 h. as needed for shortness of     breath or wheezing. 4. Folic acid 1 mg by mouth daily. 5. Multivitamin 1 tablet by mouth daily. 6. Spiriva 18 mcg inhaled daily.  7. Thiamine 100 mg by mouth daily. 8. Zoloft 100 mg by mouth every morning.  DISPOSITION AT TIME OF DISCHARGE:  [name] has met maximum benefit of inpatient therapy and is currently medically stable and  cleared for discharge pending follow up as above.     Canary Brim, NP   ______________________________ [name] Helling, MD    BO/MEDQ  D:  02/11/2011  T:  02/12/2011  Job:  [id number]  cc:   Kalman Shan, MD Windle Guard, M.D.  Electronically Signed by [name] Helling MD on 02/15/2011 09:30:40 AM Electronically Signed by Canary Brim  on 02/15/2011 02:19:48 PM

## 2011-02-17 ENCOUNTER — Encounter: Payer: Self-pay | Admitting: Internal Medicine

## 2011-02-26 ENCOUNTER — Encounter: Payer: Self-pay | Admitting: Internal Medicine

## 2011-02-26 ENCOUNTER — Ambulatory Visit (INDEPENDENT_AMBULATORY_CARE_PROVIDER_SITE_OTHER): Payer: Medicaid Other | Admitting: Internal Medicine

## 2011-02-26 VITALS — BP 118/72 | HR 78 | Temp 97.6°F | Ht 60.0 in | Wt 150.8 lb

## 2011-02-26 DIAGNOSIS — J449 Chronic obstructive pulmonary disease, unspecified: Secondary | ICD-10-CM

## 2011-02-26 DIAGNOSIS — J9589 Other postprocedural complications and disorders of respiratory system, not elsewhere classified: Secondary | ICD-10-CM

## 2011-02-26 DIAGNOSIS — J80 Acute respiratory distress syndrome: Secondary | ICD-10-CM

## 2011-02-26 DIAGNOSIS — F17200 Nicotine dependence, unspecified, uncomplicated: Secondary | ICD-10-CM

## 2011-02-26 NOTE — Patient Instructions (Signed)
I am glad you survived your major illness of ARDS, pneumonia, septic shock and delirium I am glad you are feeling well now I am glad you quit smoking and drinking too My nurse will walk you now for oxygen levels Have breathing test in 6 weeks called PFTs Have cxr in 6 weeks as followup for pneumonia and ARDS

## 2011-02-26 NOTE — Progress Notes (Signed)
  Subjective:    Patient ID: [name] [name], female    DOB: May 05, 1951, 60 y.o.   MRN: [id number]  HPI  60 year old female. Heavy etoh until feb 2012 and then quit. Heavey smoker but quit this month. Admitted 02/02/2011 through 02/11/2011 with PNA NOS, septic shock NOS, ARDS, Acute resp failure, Delirium/etoh withdrawal and secondary diastolic chf. Now following up post ICU stay. Feels well. Back to baseline. Has quit smoking. Not relapsed to etoh. Mild class 2-3 dyspnea on exertion with stairs etc. Persists. Mild cough too. Appetite good. Denies problems. Glad she is better.  Review of Systems  Constitutional: Negative for fever and unexpected weight change.  HENT: Negative for ear pain, nosebleeds, congestion, sore throat, rhinorrhea, sneezing, trouble swallowing, dental problem, postnasal drip and sinus pressure.   Eyes: Negative for redness and itching.  Respiratory: Positive for cough and shortness of breath. Negative for chest tightness and wheezing.   Cardiovascular: Negative for palpitations and leg swelling.  Gastrointestinal: Negative for nausea and vomiting.  Genitourinary: Negative for dysuria.  Musculoskeletal: Negative for joint swelling.  Skin: Negative for rash.  Neurological: Negative for headaches.  Hematological: Does not bruise/bleed easily.  Psychiatric/Behavioral: Negative for dysphoric mood. The patient is not nervous/anxious.        Objective:   Physical Exam  Vitals reviewed. Constitutional: She is oriented to person, place, and time. She appears well-developed and well-nourished. No distress.  HENT:  Head: Normocephalic and atraumatic.  Right Ear: External ear normal.  Left Ear: External ear normal.  Mouth/Throat: Oropharynx is clear and moist. No oropharyngeal exudate.       Dentures +  Eyes: Conjunctivae and EOM are normal. Pupils are equal, round, and reactive to light. Right eye exhibits no discharge. Left eye exhibits no discharge. No scleral icterus.    Neck: Normal range of motion. Neck supple. No JVD present. No tracheal deviation present. No thyromegaly present.  Cardiovascular: Normal rate, regular rhythm, normal heart sounds and intact distal pulses.  Exam reveals no gallop and no friction rub.   No murmur heard. Pulmonary/Chest: Effort normal and breath sounds normal. No respiratory distress. She has no wheezes. She has no rales. She exhibits no tenderness.       Somewhat of a prolonged exhalation No wheeze No distress  Abdominal: Soft. Bowel sounds are normal. She exhibits no distension and no mass. There is no tenderness. There is no rebound and no guarding.  Musculoskeletal: Normal range of motion. She exhibits no edema and no tenderness.  Lymphadenopathy:    She has no cervical adenopathy.  Neurological: She is alert and oriented to person, place, and time. She has normal reflexes. No cranial nerve deficit. She exhibits normal muscle tone. Coordination normal.  Skin: Skin is warm and dry. No rash noted. She is not diaphoretic. No erythema. No pallor.  Psychiatric: She has a normal mood and affect. Her behavior is normal. Judgment and thought content normal.          Assessment & Plan:

## 2011-03-01 DIAGNOSIS — J80 Acute respiratory distress syndrome: Secondary | ICD-10-CM | POA: Insufficient documentation

## 2011-03-01 DIAGNOSIS — J449 Chronic obstructive pulmonary disease, unspecified: Secondary | ICD-10-CM | POA: Insufficient documentation

## 2011-03-01 NOTE — Assessment & Plan Note (Signed)
My nurse will walk you now for oxygen levels Have breathing test in 6 weeks called PFTs Continue spiriva till severity established At fu, consider referral to rehab

## 2011-03-01 NOTE — Assessment & Plan Note (Addendum)
Congratulated on quitting Encouraged remission

## 2011-03-01 NOTE — Assessment & Plan Note (Signed)
Clinically resolved  Plan Repeat cxr 6 weeks to ensure radiological resolution

## 2011-03-05 LAB — FUNGUS CULTURE W SMEAR

## 2011-03-18 LAB — AFB CULTURE WITH SMEAR (NOT AT ARMC): Acid Fast Smear: NONE SEEN

## 2011-03-30 ENCOUNTER — Ambulatory Visit (INDEPENDENT_AMBULATORY_CARE_PROVIDER_SITE_OTHER): Payer: Medicaid Other | Admitting: Internal Medicine

## 2011-03-30 ENCOUNTER — Encounter: Payer: Self-pay | Admitting: Internal Medicine

## 2011-03-30 ENCOUNTER — Ambulatory Visit (INDEPENDENT_AMBULATORY_CARE_PROVIDER_SITE_OTHER)
Admission: RE | Admit: 2011-03-30 | Discharge: 2011-03-30 | Disposition: A | Discharge status: Home / Self Care | Payer: Medicaid Other | Source: Ambulatory Visit | Attending: Internal Medicine | Admitting: Internal Medicine

## 2011-03-30 VITALS — BP 102/70 | HR 86 | Temp 98.3°F | Ht 60.0 in | Wt 152.0 lb

## 2011-03-30 DIAGNOSIS — F17200 Nicotine dependence, unspecified, uncomplicated: Secondary | ICD-10-CM

## 2011-03-30 DIAGNOSIS — J449 Chronic obstructive pulmonary disease, unspecified: Secondary | ICD-10-CM

## 2011-03-30 DIAGNOSIS — J189 Pneumonia, unspecified organism: Secondary | ICD-10-CM

## 2011-03-30 DIAGNOSIS — J80 Acute respiratory distress syndrome: Secondary | ICD-10-CM

## 2011-03-30 DIAGNOSIS — R0602 Shortness of breath: Secondary | ICD-10-CM

## 2011-03-30 DIAGNOSIS — J9589 Other postprocedural complications and disorders of respiratory system, not elsewhere classified: Secondary | ICD-10-CM

## 2011-03-30 LAB — PULMONARY FUNCTION TEST

## 2011-03-30 MED ORDER — TIOTROPIUM BROMIDE MONOHYDRATE 18 MCG IN CAPS: 18.0000 ug | ORAL_CAPSULE | Freq: Every day | RESPIRATORY_TRACT | Status: DC

## 2011-03-30 NOTE — Patient Instructions (Addendum)
#  smoking  - please work on quitting  - next visit we can discuss medications to help you quit #pneumonia  - have cxr today to ensure that it is clearing #shortness of breath   - i think this is due to loss of fitness from recent ICU illness and gold stage1 copd. You will benefit from pulmonary rehab for ARDS, pneumonia, related shortness of breath - we will set up pulm rehab referral - there might be some element of emphysema too  - restart spiriva 1 puff daily - take sample, show technique, get refills #Followup - return in 4 months -

## 2011-03-30 NOTE — Progress Notes (Signed)
Subjective:    Patient ID: [name] [name], female    DOB: 10-Apr-1951, 60 y.o.   MRN: [id number]  HPI OV 02/26/2011: 60 year old female. Heavy etoh until feb 2012 and then quit. Heavey smoker but quit this month. Admitted 02/02/2011 through 02/11/2011 with PNA NOS, septic shock NOS, ARDS, Acute resp failure, Delirium/etoh withdrawal and secondary diastolic chf. Now following up post ICU stay. Feels well. Back to baseline. Has quit smoking. Not relapsed to etoh. Mild class 2-3 dyspnea on exertion with stairs etc. Persists. Mild cough too. Appetite good. Denies problems. She is glad she is better. REC:  SMOKING IN REMISSION, HAVE PFTS AND RETURN, CXR FOR PNA CLEARANCE IN 6 WEEKS  OV 03/30/2011: Followup  Dyspnea and cough after pft, ex-tobacco abuse, pneumonia/ards followup after cxr. She is off spiriva x 1 month; ran out and PMD told her to hold off but she felt it was helping. Last few days slight incrase in dyspnea on exertion due to hot temperatures. Brought on with wal,king to mail box or moving trash and relieved by rest, Associated cough is somewhat better. Has relapsed into smoking due to room mate being a smoker. CXR ordered today: shows clearance of pneumonia (personally reviewed). PFts today (off spiriva x 1  Month) - essentially normal except for reduced small airways and dlco suggestive of radiologiical emphysema (Fev1 1.95L/98%, small airways 325, dlco 12.7/65%, tlc 111%).    Past Medical History  Diagnosis Date  . Respiratory failure with hypoxia   . Pneumonia   . Altered mental status     secondary to ethyl alcohol withdrawal  . Systemic inflammatory response syndrome   . COPD (chronic obstructive pulmonary disease)   . Tobacco abuse   . Diastolic congestive heart failure   . Depression   . HTN (hypertension)      Family History  Problem Relation Age of Onset  . Heart disease Father   . Lung cancer Father   . Lung cancer Mother   . Diabetes Father   . Asthma Father       History   Social History  . Marital Status: Divorced    Spouse Name: N/A    Number of Children: N/A  . Years of Education: N/A   Occupational History  . retired    Social History Main Topics  . Smoking status: Current Everyday Smoker -- 2.0 packs/day for 40 years    Types: Cigarettes  . Smokeless tobacco: Not on file   Comment: relapsed smoking june 2012  . Alcohol Use: Yes  . Drug Use: No  . Sexually Active: Not on file   Other Topics Concern  . Not on file   Social History Narrative   Pt was adopted at age 30 but still is in contact with biological family.      Allergies  Allergen Reactions  . Codeine      Outpatient Prescriptions Prior to Visit  Medication Sig Dispense Refill  . acetaminophen (TYLENOL) 650 MG CR tablet Take 650 mg by mouth every 4 (four) hours as needed.       Marland Kitchen albuterol (PROAIR HFA) 108 (90 BASE) MCG/ACT inhaler Inhale 2 puffs into the lungs every 4 (four) hours as needed.       . folic acid (FOLVITE) 1 MG tablet Take 1 mg by mouth daily.        . Multiple Vitamin (MULTIVITAMIN) capsule Take 1 capsule by mouth daily.        . sertraline (ZOLOFT) 100  MG tablet Take 100 mg by mouth daily.        Marland Kitchen thiamine 100 MG tablet Take 100 mg by mouth daily.       Marland Kitchen tiotropium (SPIRIVA) 18 MCG inhalation capsule Place 18 mcg into inhaler and inhale daily.           Review of Systems  Constitutional: Negative for fever and unexpected weight change.  HENT: Negative for ear pain, nosebleeds, congestion, sore throat, rhinorrhea, sneezing, trouble swallowing, dental problem, postnasal drip and sinus pressure.   Eyes: Negative for redness and itching.  Respiratory: Positive for shortness of breath. Negative for cough, chest tightness and wheezing.   Cardiovascular: Negative for palpitations and leg swelling.  Gastrointestinal: Negative for nausea and vomiting.  Genitourinary: Negative for dysuria.  Musculoskeletal: Negative for joint swelling.  Skin:  Negative for rash.  Neurological: Negative for headaches.  Hematological: Does not bruise/bleed easily.  Psychiatric/Behavioral: Negative for dysphoric mood. The patient is not nervous/anxious.        Objective:   Physical Exam Physical Exam  Vitals reviewed. Constitutional: She is oriented to person, place, and time. She appears well-developed and well-nourished. No distress.  HENT:  Head: Normocephalic and atraumatic.  Right Ear: External ear normal.  Left Ear: External ear normal.  Mouth/Throat: Oropharynx is clear and moist. No oropharyngeal exudate.       Dentures +  Eyes: Conjunctivae and EOM are normal. Pupils are equal, round, and reactive to light. Right eye exhibits no discharge. Left eye exhibits no discharge. No scleral icterus.  Neck: Normal range of motion. Neck supple. No JVD present. No tracheal deviation present. No thyromegaly present.  Cardiovascular: Normal rate, regular rhythm, normal heart sounds and intact distal pulses.  Exam reveals no gallop and no friction rub.   No murmur heard. Pulmonary/Chest: Effort normal and breath sounds normal. No respiratory distress. She has no wheezes. She has no rales. She exhibits no tenderness.       Somewhat of a prolonged exhalation No wheeze No distress  Abdominal: Soft. Bowel sounds are normal. She exhibits no distension and no mass. There is no tenderness. There is no rebound and no guarding.  Musculoskeletal: Normal range of motion. She exhibits no edema and no tenderness.  Lymphadenopathy:    She has no cervical adenopathy.  Neurological: She is alert and oriented to person, place, and time. She has normal reflexes. No cranial nerve deficit. She exhibits normal muscle tone. Coordination normal.  Skin: Skin is warm and dry. No rash noted. She is not diaphoretic. No erythema. No pallor.  Psychiatric: She has a normal mood and affect. Her behavior is normal. Judgment and thought content normal.         Assessment &  Plan:

## 2011-03-30 NOTE — Progress Notes (Signed)
PFT done today. 

## 2011-04-01 ENCOUNTER — Encounter: Payer: Self-pay | Admitting: Internal Medicine

## 2011-04-01 NOTE — Assessment & Plan Note (Signed)
cleard up cxr March 30, 2011  Plan No further followup

## 2011-04-01 NOTE — Assessment & Plan Note (Signed)
Quit may 2012 following criticall illness. Relapsed June 2012 due to room mate smoking behavior  PLAn 3 min counseling to quit. She is going to work on it. At fu wil review med options

## 2011-04-01 NOTE — Assessment & Plan Note (Addendum)
Gold stabge 1 on PFTs 03/30/2011 - FEv1 1.95/95%, Ratio low 65, DLCO 65%, Small airways reduced 34%, TLC slighjtly high 111%. Class 2-3 dyspnea on exertion while off spiriva but dyspnea also due to deconditioning  PLAN Albuterol prn Restart spiriva - mdi tech taught Refer pulmonary rehab If above dont work, consider LABA or spiriva

## 2011-04-15 ENCOUNTER — Encounter: Payer: Self-pay | Admitting: Internal Medicine

## 2011-04-16 ENCOUNTER — Telehealth: Payer: Self-pay | Admitting: Internal Medicine

## 2011-04-16 NOTE — Telephone Encounter (Signed)
Pt aware of cxr results.  See documentation in pt's chart under images for complete documentation of this.

## 2011-06-30 LAB — BASIC METABOLIC PANEL
Calcium: 9.9
GFR calc Af Amer: >60
GFR calc non Af Amer: >60
Glucose, Bld: 95
Potassium: 3.4 — ABNORMAL LOW
Sodium: 142

## 2012-02-20 ENCOUNTER — Encounter (HOSPITAL_COMMUNITY): Payer: Self-pay

## 2012-02-20 ENCOUNTER — Emergency Department (HOSPITAL_COMMUNITY)
Admission: EM | Admit: 2012-02-20 | Discharge: 2012-02-21 | Disposition: A | Discharge status: Other Acute Inpatient Hospital | Payer: Medicaid Other | Source: Home / Self Care | Attending: Emergency Medicine | Admitting: Emergency Medicine

## 2012-02-20 DIAGNOSIS — F1010 Alcohol abuse, uncomplicated: Secondary | ICD-10-CM | POA: Insufficient documentation

## 2012-02-20 DIAGNOSIS — R109 Unspecified abdominal pain: Secondary | ICD-10-CM | POA: Insufficient documentation

## 2012-02-20 DIAGNOSIS — Z79899 Other long term (current) drug therapy: Secondary | ICD-10-CM | POA: Insufficient documentation

## 2012-02-20 DIAGNOSIS — I10 Essential (primary) hypertension: Secondary | ICD-10-CM | POA: Insufficient documentation

## 2012-02-20 DIAGNOSIS — J4489 Other specified chronic obstructive pulmonary disease: Secondary | ICD-10-CM | POA: Insufficient documentation

## 2012-02-20 DIAGNOSIS — F1020 Alcohol dependence, uncomplicated: Secondary | ICD-10-CM

## 2012-02-20 DIAGNOSIS — J449 Chronic obstructive pulmonary disease, unspecified: Secondary | ICD-10-CM | POA: Insufficient documentation

## 2012-02-20 HISTORY — DX: Alcohol abuse, uncomplicated: F10.10

## 2012-02-20 NOTE — ED Notes (Signed)
Pt present with no acute distress.  Requesting help for ETOH last drink was 6pm.  LLQ pain for several days- denies N/V/D and fever- denies urinary problems.

## 2012-02-20 NOTE — ED Notes (Addendum)
Pt's sister called and stated that the pt was just "not acting right" and that she had been drinking since 630am this morning. Pt claims she is having lower abdominal pain w/ a hx of liver disease. Denies n/v/d. BS 121

## 2012-02-21 ENCOUNTER — Encounter (HOSPITAL_COMMUNITY): Payer: Self-pay | Admitting: Rehabilitation

## 2012-02-21 ENCOUNTER — Inpatient Hospital Stay (HOSPITAL_COMMUNITY)
Admission: EM | Admit: 2012-02-21 | Discharge: 2012-02-25 | DRG: 897 | Disposition: A | Discharge status: Home / Self Care | Payer: Medicaid Other | Source: Ambulatory Visit | Attending: Psychiatry | Admitting: Psychiatry

## 2012-02-21 DIAGNOSIS — J4489 Other specified chronic obstructive pulmonary disease: Secondary | ICD-10-CM | POA: Diagnosis present

## 2012-02-21 DIAGNOSIS — F17200 Nicotine dependence, unspecified, uncomplicated: Secondary | ICD-10-CM | POA: Diagnosis present

## 2012-02-21 DIAGNOSIS — F329 Major depressive disorder, single episode, unspecified: Secondary | ICD-10-CM | POA: Diagnosis present

## 2012-02-21 DIAGNOSIS — I5020 Unspecified systolic (congestive) heart failure: Secondary | ICD-10-CM | POA: Diagnosis present

## 2012-02-21 DIAGNOSIS — F39 Unspecified mood [affective] disorder: Secondary | ICD-10-CM | POA: Diagnosis present

## 2012-02-21 DIAGNOSIS — I509 Heart failure, unspecified: Secondary | ICD-10-CM | POA: Diagnosis present

## 2012-02-21 DIAGNOSIS — J449 Chronic obstructive pulmonary disease, unspecified: Secondary | ICD-10-CM

## 2012-02-21 DIAGNOSIS — I10 Essential (primary) hypertension: Secondary | ICD-10-CM | POA: Diagnosis present

## 2012-02-21 DIAGNOSIS — Z79899 Other long term (current) drug therapy: Secondary | ICD-10-CM

## 2012-02-21 DIAGNOSIS — F10939 Alcohol use, unspecified with withdrawal, unspecified: Secondary | ICD-10-CM | POA: Diagnosis not present

## 2012-02-21 DIAGNOSIS — F1020 Alcohol dependence, uncomplicated: Principal | ICD-10-CM | POA: Diagnosis present

## 2012-02-21 DIAGNOSIS — F10239 Alcohol dependence with withdrawal, unspecified: Secondary | ICD-10-CM | POA: Diagnosis not present

## 2012-02-21 LAB — RAPID URINE DRUG SCREEN, HOSP PERFORMED
Barbiturates: NOT DETECTED
Benzodiazepines: NOT DETECTED

## 2012-02-21 LAB — COMPREHENSIVE METABOLIC PANEL
ALT: 25 U/L (ref 0–35)
Alkaline Phosphatase: 82 U/L (ref 39–117)
BUN: 16 mg/dL (ref 6–23)
CO2: 20 mEq/L (ref 19–32)
Chloride: 103 mEq/L (ref 96–112)
GFR calc Af Amer: >90 mL/min (ref >90)
Glucose, Bld: 108 mg/dL — ABNORMAL HIGH (ref 70–99)
Potassium: 3.6 mEq/L (ref 3.5–5.1)
Sodium: 138 mEq/L (ref 135–145)
Total Bilirubin: 0.1 mg/dL — ABNORMAL LOW (ref 0.3–1.2)
Total Protein: 8.3 g/dL (ref 6.0–8.3)

## 2012-02-21 LAB — URINALYSIS, ROUTINE W REFLEX MICROSCOPIC
Glucose, UA: NEGATIVE mg/dL
Ketones, ur: NEGATIVE mg/dL
Leukocytes, UA: NEGATIVE
Nitrite: NEGATIVE
Protein, ur: NEGATIVE mg/dL
pH: 5.5 (ref 5.0–8.0)

## 2012-02-21 LAB — CBC
HCT: 44.2 % (ref 36.0–46.0)
Hemoglobin: 14.6 g/dL (ref 12.0–15.0)
RBC: 4.59 MIL/uL (ref 3.87–5.11)
WBC: 7.8 10*3/uL (ref 4.0–10.5)

## 2012-02-21 LAB — ETHANOL: Alcohol, Ethyl (B): 228 mg/dL — ABNORMAL HIGH (ref 0–11)

## 2012-02-21 MED ORDER — CHLORDIAZEPOXIDE HCL 25 MG PO CAPS: 25.0000 mg | ORAL_CAPSULE | Freq: Four times a day (QID) | ORAL | Status: AC | PRN

## 2012-02-21 MED ORDER — CHLORDIAZEPOXIDE HCL 25 MG PO CAPS
25.0000 mg | ORAL_CAPSULE | Freq: Four times a day (QID) | ORAL | Status: AC
  Administered 2012-02-21 – 2012-02-22 (×4): 25 mg via ORAL
  Filled 2012-02-21 (×4): qty 1

## 2012-02-21 MED ORDER — HYDROXYZINE HCL 25 MG PO TABS: 25.0000 mg | ORAL_TABLET | Freq: Four times a day (QID) | ORAL | Status: AC | PRN

## 2012-02-21 MED ORDER — FOLIC ACID 1 MG PO TABS
1.0000 mg | ORAL_TABLET | Freq: Every day | ORAL | Status: DC
  Administered 2012-02-21: 1 mg via ORAL
  Filled 2012-02-21: qty 1

## 2012-02-21 MED ORDER — MAGNESIUM HYDROXIDE 400 MG/5ML PO SUSP: 30.0000 mL | Freq: Every day | ORAL | Status: DC | PRN

## 2012-02-21 MED ORDER — LORAZEPAM 2 MG/ML IJ SOLN: 1.0000 mg | Freq: Four times a day (QID) | INTRAMUSCULAR | Status: DC | PRN

## 2012-02-21 MED ORDER — TRAZODONE HCL 100 MG PO TABS
100.0000 mg | ORAL_TABLET | Freq: Every evening | ORAL | Status: DC | PRN
  Filled 2012-02-21: qty 1

## 2012-02-21 MED ORDER — VITAMIN B-1 100 MG PO TABS
100.0000 mg | ORAL_TABLET | Freq: Every day | ORAL | Status: DC
  Administered 2012-02-21: 100 mg via ORAL
  Filled 2012-02-21: qty 1

## 2012-02-21 MED ORDER — LORAZEPAM 1 MG PO TABS: 1.0000 mg | ORAL_TABLET | Freq: Four times a day (QID) | ORAL | Status: DC | PRN

## 2012-02-21 MED ORDER — CHLORDIAZEPOXIDE HCL 25 MG PO CAPS: 25.0000 mg | ORAL_CAPSULE | Freq: Every day | ORAL | Status: DC

## 2012-02-21 MED ORDER — ADULT MULTIVITAMIN W/MINERALS CH
1.0000 | ORAL_TABLET | Freq: Every day | ORAL | Status: DC
  Administered 2012-02-21: 1 via ORAL

## 2012-02-21 MED ORDER — CITALOPRAM HYDROBROMIDE 40 MG PO TABS
40.0000 mg | ORAL_TABLET | Freq: Every day | ORAL | Status: DC
  Administered 2012-02-21 – 2012-02-25 (×5): 40 mg via ORAL
  Filled 2012-02-21: qty 14
  Filled 2012-02-21 (×7): qty 1

## 2012-02-21 MED ORDER — TIOTROPIUM BROMIDE MONOHYDRATE 18 MCG IN CAPS
18.0000 ug | ORAL_CAPSULE | Freq: Every day | RESPIRATORY_TRACT | Status: DC
  Administered 2012-02-21 – 2012-02-25 (×5): 18 ug via RESPIRATORY_TRACT
  Filled 2012-02-21 (×2): qty 5

## 2012-02-21 MED ORDER — CHLORDIAZEPOXIDE HCL 25 MG PO CAPS
25.0000 mg | ORAL_CAPSULE | Freq: Three times a day (TID) | ORAL | Status: AC
  Administered 2012-02-22 – 2012-02-23 (×3): 25 mg via ORAL
  Filled 2012-02-21 (×3): qty 1

## 2012-02-21 MED ORDER — ALBUTEROL SULFATE HFA 108 (90 BASE) MCG/ACT IN AERS: 2.0000 | INHALATION_SPRAY | RESPIRATORY_TRACT | Status: DC | PRN

## 2012-02-21 MED ORDER — ONDANSETRON 4 MG PO TBDP: 4.0000 mg | ORAL_TABLET | Freq: Four times a day (QID) | ORAL | Status: AC | PRN

## 2012-02-21 MED ORDER — THIAMINE HCL 100 MG/ML IJ SOLN: 100.0000 mg | Freq: Once | INTRAMUSCULAR | Status: DC

## 2012-02-21 MED ORDER — LOPERAMIDE HCL 2 MG PO CAPS: 2.0000 mg | ORAL_CAPSULE | ORAL | Status: AC | PRN

## 2012-02-21 MED ORDER — NICOTINE 14 MG/24HR TD PT24
14.0000 mg | MEDICATED_PATCH | Freq: Every day | TRANSDERMAL | Status: DC
  Administered 2012-02-21 – 2012-02-25 (×5): 14 mg via TRANSDERMAL
  Filled 2012-02-21 (×6): qty 1
  Filled 2012-02-21: qty 14
  Filled 2012-02-21 (×2): qty 1

## 2012-02-21 MED ORDER — ALUM & MAG HYDROXIDE-SIMETH 200-200-20 MG/5ML PO SUSP: 30.0000 mL | ORAL | Status: DC | PRN

## 2012-02-21 MED ORDER — CHLORDIAZEPOXIDE HCL 25 MG PO CAPS
25.0000 mg | ORAL_CAPSULE | ORAL | Status: AC
  Administered 2012-02-23 – 2012-02-24 (×2): 25 mg via ORAL
  Filled 2012-02-21 (×2): qty 1

## 2012-02-21 MED ORDER — THIAMINE HCL 100 MG/ML IJ SOLN: 100.0000 mg | Freq: Every day | INTRAMUSCULAR | Status: DC

## 2012-02-21 MED ORDER — ADULT MULTIVITAMIN W/MINERALS CH
1.0000 | ORAL_TABLET | Freq: Every day | ORAL | Status: DC
  Administered 2012-02-21 – 2012-02-25 (×5): 1 via ORAL
  Filled 2012-02-21 (×7): qty 1

## 2012-02-21 MED ORDER — VITAMIN B-1 100 MG PO TABS
100.0000 mg | ORAL_TABLET | Freq: Every day | ORAL | Status: DC
  Administered 2012-02-22 – 2012-02-25 (×4): 100 mg via ORAL
  Filled 2012-02-21 (×6): qty 1

## 2012-02-21 MED ORDER — ACETAMINOPHEN 325 MG PO TABS
650.0000 mg | ORAL_TABLET | Freq: Four times a day (QID) | ORAL | Status: DC | PRN
Start: 2012-02-21 — End: 2012-02-25

## 2012-02-21 NOTE — ED Provider Notes (Signed)
History     CSN: [id number]  Arrival date & time 02/20/12  2247   First MD Initiated Contact with Patient 02/21/12 0118      Chief Complaint  Patient presents with  . Abdominal Pain  . Alcohol Intoxication  . Medical Clearance    (Consider location/radiation/quality/duration/timing/severity/associated sxs/prior treatment) HPI Comments: 61 year old female with a history of severe alcohol abuse, who presents with a complaint of alcohol abuse and depression. According to the patient she has had a history of frequent and heavy alcohol use but she says she uses to help with her depression and stress. She last drank just prior to arrival, her sister called 911 for assistance worried about her drinking. The patient denies having a history of withdrawal seizures or delirium tremens and states that she is nonsuicidal and has not taken an overdose of any other medications. She denies any other drug use, denies any physical symptoms including chest pain shortness of breath headache numbness weakness or difficulty speaking. According to the medical record the patient has been admitted in the intensive. After pneumonia causing acute respiratory failure last year. She is rehabilitated well from this and has no difficulty breathing tonight.  Patient is a 61 y.o. female presenting with abdominal pain and intoxication. The history is provided by the patient and medical records.  Abdominal Pain The primary symptoms of the illness include abdominal pain.  Alcohol Intoxication Associated symptoms include abdominal pain.    Past Medical History  Diagnosis Date  . Respiratory failure with hypoxia   . Pneumonia   . Altered mental status     secondary to ethyl alcohol withdrawal  . Systemic inflammatory response syndrome   . COPD (chronic obstructive pulmonary disease)   . Tobacco abuse   . Diastolic congestive heart failure   . Depression   . HTN (hypertension)   . ETOH abuse   . Suicidal ideation       Past Surgical History  Procedure Date  . Cesarean section   . Tubal ligation   . Cholecystectomy   . Cystectomy     both ears  . Total abdominal hysterectomy     Family History  Problem Relation Age of Onset  . Heart disease Father   . Lung cancer Father   . Lung cancer Mother   . Diabetes Father   . Asthma Father     History  Substance Use Topics  . Smoking status: Current Everyday Smoker -- 1.0 packs/day for 40 years    Types: Cigarettes  . Smokeless tobacco: Not on file   Comment: relapsed smoking june 2012  . Alcohol Use: Yes    OB History    Grav Para Term Preterm Abortions TAB SAB Ect Mult Living                  Review of Systems  Gastrointestinal: Positive for abdominal pain.  All other systems reviewed and are negative.    Allergies  Codeine  Home Medications   No current outpatient prescriptions on file.  BP 151/98  Pulse 94  Temp(Src) 98.5 F (36.9 C) (Oral)  Resp 18  SpO2 93%  Physical Exam  Nursing note and vitals reviewed. Constitutional: She appears well-developed and well-nourished. No distress.  HENT:  Head: Normocephalic and atraumatic.  Mouth/Throat: Oropharynx is clear and moist. No oropharyngeal exudate.  Eyes: Conjunctivae and EOM are normal. Pupils are equal, round, and reactive to light. Right eye exhibits no discharge. Left eye exhibits no discharge. No scleral icterus.  Neck: Normal range of motion. Neck supple. No JVD present. No thyromegaly present.  Cardiovascular: Normal rate, regular rhythm, normal heart sounds and intact distal pulses.  Exam reveals no gallop and no friction rub.   No murmur heard. Pulmonary/Chest: Effort normal and breath sounds normal. No respiratory distress. She has no wheezes. She has no rales.  Abdominal: Soft. Bowel sounds are normal. She exhibits no distension and no mass. There is no tenderness.  Musculoskeletal: Normal range of motion. She exhibits no edema and no tenderness.   Lymphadenopathy:    She has no cervical adenopathy.  Neurological: She is alert. Coordination normal.  Skin: Skin is warm and dry. No rash noted. No erythema.  Psychiatric: She has a normal mood and affect. Her behavior is normal.       Tearful, appropriate, no hallucinations, no suicidal thoughts    ED Course  Procedures (including critical care time)  Labs Reviewed  COMPREHENSIVE METABOLIC PANEL - Abnormal; Notable for the following:    Glucose, Bld 108 (*)    AST 45 (*)    Total Bilirubin 0.1 (*)    All other components within normal limits  ETHANOL - Abnormal; Notable for the following:    Alcohol, Ethyl (B) 228 (*)    All other components within normal limits  CBC  URINE RAPID DRUG SCREEN (HOSP PERFORMED)  URINALYSIS, ROUTINE W REFLEX MICROSCOPIC   No results found.   1. Alcoholism       MDM  Vital signs are normal, abdomen is soft, the patient smells of alcohol but has overall a very appropriate demeanor in requesting help for alcohol detox and depression. She does not appear to be acutely suicidal and does not appear to have any ongoing significant medical issues other than possible alcohol withdrawal to ensue. Holding orders written, labs reviewed initially showing normal blood counts and normal urinalysis.        Vida Roller, MD 02/22/12 [phone]
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
## 2012-02-21 NOTE — ED Notes (Signed)
2 pt belonging bags placed in locker #34

## 2012-02-21 NOTE — ED Notes (Signed)
Dr. Hyacinth Meeker in to assess pt at this time.

## 2012-02-21 NOTE — ED Provider Notes (Addendum)
She presented to the emergency room intoxicated with alcohol wanting detoxification. She has been admitted to several different facilities in the past. She is sober now, and in no apparent distress. She continues to seek detox treatment. The patient has been assessed and placement is being attempted. She expresses no immediate needs or desires now. Vital signs are essentially normal. A recumbent blood pressure at 0618, is 94/56.  Flint Melter, MD 02/21/12 [phone]  Accepted at [facility]  Flint Melter, MD 02/21/12 [phone]

## 2012-02-21 NOTE — ED Notes (Signed)
Pt has been accepted to [facility] by Lynann Bologna to [facility] bed 300-2. EDP has been notified and is in agreement with disposition. RN made aware to call report. All support paperwork has been completed and faxed to [facility] for review. No further needs identified at this time.

## 2012-02-21 NOTE — ED Notes (Signed)
Pt wanded by security, belongings at nurses station

## 2012-02-22 DIAGNOSIS — F329 Major depressive disorder, single episode, unspecified: Secondary | ICD-10-CM | POA: Diagnosis present

## 2012-02-22 DIAGNOSIS — F4321 Adjustment disorder with depressed mood: Secondary | ICD-10-CM

## 2012-02-22 DIAGNOSIS — F1020 Alcohol dependence, uncomplicated: Principal | ICD-10-CM | POA: Diagnosis present

## 2012-02-22 DIAGNOSIS — F39 Unspecified mood [affective] disorder: Secondary | ICD-10-CM

## 2012-02-22 MED ORDER — LORATADINE 10 MG PO TABS
10.0000 mg | ORAL_TABLET | Freq: Every day | ORAL | Status: DC
  Administered 2012-02-22 – 2012-02-25 (×4): 10 mg via ORAL
  Filled 2012-02-22: qty 1
  Filled 2012-02-22: qty 3
  Filled 2012-02-22 (×4): qty 1

## 2012-02-22 MED ORDER — FLUTICASONE PROPIONATE 50 MCG/ACT NA SUSP
2.0000 | Freq: Every day | NASAL | Status: DC
  Administered 2012-02-22 – 2012-02-25 (×4): 2 via NASAL
  Filled 2012-02-22: qty 16
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
## 2012-02-22 NOTE — Progress Notes (Signed)
Pt has been up for groups today and has been interacting with staff and peers appropriately.  She rated her depression a 5 hopelessness 3 and her anxiety a 4 on her self-inventory.  She denied any S/H ideation or A/V hallucination.  She denies symptoms of withdrawal except for slight tremor and anxiety.  Her CIWA has been 2-4 today.  Counselor spoke with pt's daughter who reports that pt does not go to AA, and the fact she "refuses" to get a sponsor.  Pt has been to [facility] in the past and does not want to go back there.

## 2012-02-22 NOTE — Progress Notes (Signed)
[facility] Group Notes:  (Counselor/Nursing/MHT/Case Management/Adjunct)  02/22/2012 10:15 PM  Type of Therapy:  Group Therapy at 11AM  Participation Level:  Minimal  Participation Quality:  Drowsy  Affect:  Depressed  Cognitive:  Oriented  Insight:  None shared  Engagement in Group:  Limited  Engagement in Therapy:  None  Modes of Intervention:  Socialization and Support  Summary of Progress/Problems:  New patient was somewhat attentive during first few minutes of group discussion which was focused on obstacles to recovery yet dozed off soon thereafter.    [name] 02/22/2012, 10:15 PM

## 2012-02-22 NOTE — Progress Notes (Signed)
Patient's daughter, Silvio Clayman, at [phone] was provided suicide prevention education this afternoon and expressed the following concerns regarding her mother which she wanted the treatment team to be aware of.  1. 'Mother refuses to get a sponsor.' 2. 'Just does not go to AA' 3. 'She has the full support of family yet will not pick up the phone before drinking again.' 4. "Mother doesn't seem to want to get better" The first time she went to [facility] she seemed to really want it but not so much this time."  [name] 02/22/2012 5:57 PM

## 2012-02-22 NOTE — Tx Team (Signed)
Initial Interdisciplinary Treatment Plan  PATIENT STRENGTHS: (choose at least two) Average or above average intelligence Capable of independent living General fund of knowledge Supportive family/friends  PATIENT STRESSORS: Financial difficulties Health problems Marital or family conflict Substance abuse   PROBLEM LIST: Problem List/Patient Goals Date to be addressed Date deferred Reason deferred Estimated date of resolution  Depression      ETOH abuse      Risk for self harm-hx of suicide attempts      Financial stressors                                     DISCHARGE CRITERIA:  Ability to meet basic life and health needs Improved stabilization in mood, thinking, and/or behavior Motivation to continue treatment in a less acute level of care Verbal commitment to aftercare and medication compliance Withdrawal symptoms are absent or subacute and managed without 24-hour nursing intervention  PRELIMINARY DISCHARGE PLAN: Attend aftercare/continuing care group Attend 12-step recovery group Participate in family therapy Return to previous living arrangement  PATIENT/FAMIILY INVOLVEMENT: This treatment plan has been presented to and reviewed with the patient, [name] [name], and/or family member.  The patient and family have been given the opportunity to ask questions and make suggestions.  Jesus Genera [facility] 02/22/2012, 1:03 AM

## 2012-02-22 NOTE — H&P (Signed)
Pt seen and evaluated upon admission.  Completed Admission Suicide Risk Assessment.  See orders.  Pt agreeable with plan.  Discussed with team.   

## 2012-02-22 NOTE — Progress Notes (Signed)
[facility] Adult Inpatient Family/Significant Other Suicide Prevention Education  Suicide Prevention Education:  Education Completed; Silvio Clayman, daughter, at [phone] has been identified by the patient as the family member/significant other with whom the patient will be residing, and identified as the person(s) who will aid the patient in the event of a mental health crisis (suicidal ideations/suicide attempt).  With written consent from the patient, the family member/significant other has been provided the following suicide prevention education, prior to the and/or following the discharge of the patient.  The suicide prevention education provided includes the following:  Suicide risk factors  Suicide prevention and interventions  National Suicide Hotline telephone number  [facility] assessment telephone number  [facility] Emergency Assistance 911  [facility] and/or Residential Mobile Crisis Unit telephone number  Request made of family/significant other to:  Remove weapons (e.g., guns, rifles, knives), all items previously/currently identified as safety concern.    Remove drugs/medications (over-the-counter, prescriptions, illicit drugs), all items previously/currently identified as a safety concern.  The family member/significant other verbalizes understanding of the suicide prevention education information provided.  The family member/significant other agrees to remove the items of safety concern listed above.  [name] 02/22/2012, 5:51 PM
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
## 2012-02-22 NOTE — Discharge Planning (Signed)
New patient attended AM group, good participation.  Here for detox.  Says it was sister who called for help, but [name] [name] is glad sister called.  Went to [facility] in October and completed the program.  Did not continue to attend AA mtgs, and says this is the reason that she relapsed.

## 2012-02-22 NOTE — Progress Notes (Signed)
02/22/2012         Time: 1415      Group Topic/Focus: The focus of this group is on discussing various styles of communication and communicating assertively using 'I' (feeling) statements.  Participation Level: Active  Participation Quality: Appropriate and Attentive  Affect: Appropriate  Cognitive: Oriented   Additional Comments: Patient able to identify positive communication strategies for use at discharge.   Jaimarie Rapozo 02/22/2012 3:48 PM 

## 2012-02-22 NOTE — Progress Notes (Signed)
Pt admitted to the 300 hall this evening for ETOH detox.  Pt attended evening AA group after coming on the unit.  She went to bed right after group.  Pt was not asleep when this writer went to speak with her.  She reports minimal withdrawal symptoms at this time.  She denies SI/HI/AV.  She voices no needs/concerns at this time.  She says she feels she will be able to go to sleep without a sleep aid.  Safety maintained with q15 minute checks.

## 2012-02-22 NOTE — [facility] Suicide Risk Assessment (Signed)
Suicide Risk Assessment  Admission Assessment      Demographic factors: See chart.  Current Mental Status:  Patient seen and evaluated. Chart reviewed. Patient stated that her mood was "ok". Her affect was mood congruent and constricted. She denied any current thoughts of self injurious behavior, suicidal ideation or homicidal ideation. There were no auditory or visual hallucinations, paranoia, delusional thought processes, or mania noted.  Thought process was linear and goal directed.  No psychomotor agitation or retardation was noted. Speech was slowed rate with decreased tone and volume. Eye contact was good. Judgment and insight are fair.  Patient has been up and engaged on the unit.  No acute safety concerns reported from team.    Loss Factors: mother and father both died in past 3 yrs  Historical Factors:  Adopted at age 29; [facility] 11/12 residential Tx; s/p OD and resp failure s/p acute intoxication; no sponsor in past  Risk Reduction Factors: AA; lives with ex-husband; daughter; sister  CLINICAL FACTORS: Alcohol Dependence & W/D; Mood Disorder NOS; Seasonal Allergies; Asthma; Complicated Grief   COGNITIVE FEATURES THAT CONTRIBUTE TO RISK: limited insight.  SUICIDE RISK: Pt viewed as a chronic increased risk of harm to self in light of her past hx and risk factors.  No acute safety concerns on the unit.  Pt contracting for safety and in need of crisis stabilization, detox & Tx.  PLAN OF CARE: Pt admitted for crisis stabilization, detox off alcohol and treatment.  Please see orders.  Medications reviewed with pt and medication education provided. Will continue q15 minute checks per unit protocol.  No clinical indication for one on one level of observation at this time.  Pt contracting for safety.  Mental health treatment, medication management and continued sobriety will mitigate against the increased risk of harm to self and/or others.  Discussed the importance of recovery with pt, as  well as, tools to move forward in a healthy & safe manner.  Pt agreeable with the plan.  Discussed with the team.   Lupe Carney 02/22/2012, 2:28 PM

## 2012-02-23 LAB — TSH: TSH: 2.419 u[IU]/mL (ref 0.350–4.500)

## 2012-02-23 LAB — T4, FREE: Free T4: 0.96 ng/dL (ref 0.80–1.80)

## 2012-02-23 LAB — T3, FREE: T3, Free: 3 pg/mL (ref 2.3–4.2)

## 2012-02-23 MED ORDER — TRAZODONE HCL 50 MG PO TABS: 50.0000 mg | ORAL_TABLET | Freq: Every evening | ORAL | Status: DC | PRN

## 2012-02-23 NOTE — Progress Notes (Signed)
[facility] Group Notes:  (Counselor/Nursing/MHT/Case Management/Adjunct)  02/24/2012 9:07 AM   Type of Therapy:  Processing Group at 11:00 am and 1:15 PM on 5.29.13  Participation Level:  Minimal  Participation Quality:  Attendance and some attention, otherwise drowsy  Affect:  Oriented  Cognitive:  Oriented  Insight:  None shared  Engagement in Group:  Minimal  Engagement in Therapy:  None noted  Modes of Intervention:  Limit setting and support  Summary of Progress/Problems:  Patient was minimally attentive and when addressed to pay attention could not remain attentive during both groups.   [name] [name] 02/24/2012

## 2012-02-23 NOTE — Tx Team (Signed)
Pt reports she is doing better today.  Her tremors are not as pronounce as yesterday.  She did get up and attend evening group.  She denies any other withdrawal symptoms.  Her request this evening is for clothes if some are available.  Informed pt that we have a clothes closet, and this writer will look for her before morning so she can bathe and put on clean clothes.  Pt is appreciative.  Pt voices no other needs at this time.  Pt denies SI/Hi.  Safety maintained with q15 minute checks.

## 2012-02-23 NOTE — Progress Notes (Signed)
Pt reports she had a good day.  She says she is experiencing minimal withdrawal symptoms.  She says she has been sleepy during the day, but attributes it to the medication.  She feels she will not need the Trazodone, but this writer reminds her it is still available.  She denies SI/HI.  She has gone to some groups, but says she has been falling asleep in them.  Her affect appears brighter than yesterday.  She is not sure of her discharge date.  Safety maintained with q15 minute checks.

## 2012-02-23 NOTE — Progress Notes (Signed)
Patient ID: [name] [name], female   DOB: 05/02/1951, 61 y.o.   MRN: [id number] Fully alert and has been up and participating in group therapy.  Says she is getting a lot out of group therapy, it is very helpful for her. She feels she is learning a lot about himself. Feels much less depressed today after getting a supportive letter from her daughter. Denies that she withdrawal symptoms. Denies anxiety, denies suicidal thoughts, and rates depression 3-4/10. Sleep and appetite are good.   Mental status exam: Fully alert female, pleasant, and cooperative. Thoughts and speech are normally organized. No dangerous ideas. Insight is improving. No obvious symptoms of alcohol withdrawal. She is tolerating all activities on the unit without difficulty. Vital signs are normal.  Plan: Continue current plan.

## 2012-02-23 NOTE — Treatment Plan (Signed)
Interdisciplinary Treatment Plan Update (Adult)  Date: 02/23/2012  Time Reviewed: 8:03 AM   Progress in Treatment: Attending groups: Yes Participating in groups: Yes Taking medication as prescribed: Yes Tolerating medication: Yes   Family/Significant other contact made:  Yes Patient understands diagnosis:  Yes  As evidenced by asking for help with alcohol withdrawal symptoms and depression Discussing patient identified problems/goals with staff:  Yes  See below Medical problems stabilized or resolved:  Yes Denies suicidal/homicidal ideation: Yes  In tx team Issues/concerns per patient self-inventory:  Yes C/O withdrawal symptoms  Depression rated 6 Other:  New problem(s) identified: N/A  Reason for Continuation of Hospitalization: Medication stabilization Withdrawal symptoms  Interventions implemented related to continuation of hospitalization:  Librium taper  Celexa trial Encourage group attendance and participation  Additional comments:  Estimated length of stay: 2-3 days  Discharge Plan:Likely return home, follow up outpt  New goal(s): N/A  Review of initial/current patient goals per problem list:   1.  Goal(s):Safely detox from alcohol  Met:  No  Target date:5/31  As evidenced by:Stable vitals, CIWA score of 0   2.  Goal (s):Eliminate SI  Met:  Yes  Target date:5/29  As evidenced [id number] denies SI in tx team  3.  Goal(s):Stabilize mood  Met:  No  Target date:5/31  As evidenced [id number] will report her depression at 3 or less and her anxiety as manageable at d/c  4.  Goal(s):Identify comprehensive sobreity plan  Met:  No  Target date:5/31  As evidenced JY:NWGN report  Attendees: Patient:  [name] [name] 02/23/2012 8:03 AM  Family:     Physician:  Lupe Carney 02/23/2012 8:03 AM   Nursing: Robbie Louis   02/23/2012 8:03 AM   Case Manager:  Richelle Ito, LCSW 02/23/2012 8:03 AM   Counselor:  Ronda Fairly, LCSWA 02/23/2012 8:03 AM     Other:     Other:     Other:     Other:      Scribe for Treatment Team:   Ida Rogue, 02/23/2012 8:03 AM

## 2012-02-23 NOTE — Progress Notes (Signed)
Pt has been up for all groups and activities today. She rated her depression a 4 hopelessness a 2 and stated,"not really" when asked about her anxiety level.  She denies any S/H ideations or A/V hallucinations.  She denies any symptoms of withdrawal except for a slight tremor she is having.  She is wanting to go home from here and not go to any long term treatment center.  No prn meds required today.

## 2012-02-24 NOTE — Progress Notes (Signed)
Patient up and in the milieu today.  Attending groups and interacting well with staff and peers.  Has been pleasant and cooperative.  Tolerating medications well.  No visible signs of detox noted today.

## 2012-02-24 NOTE — Discharge Planning (Signed)
Today in AM group [name] [name] identified her sobriety plan as going to regular AA mtgs at [place] and getting a sponsor.  Also wants to follow up at [facility] [place] for enhanced services.  Will make referral.

## 2012-02-24 NOTE — Progress Notes (Addendum)
[facility] Group Notes:  (Counselor/Nursing/MHT/Case Management/Adjunct)  02/25/2012   Type of Therapy:  Group Therapy at 11 on 02/24/2012   Participation Level:  Minimal  Participation Quality:  Attentive and Sharing  Affect:  Flat  Cognitive:  Alert and Oriented  Insight:  Limited  Engagement in Group:  Limited  Engagement in Therapy:  Limited  Modes of Intervention:  Support and Exploration  Summary of Progress/Problems:  Fonnie Mu was attentive to discussion on balance in life; she didn't share until Clinical research associate asked for input.  Patient choose to elaborate on leisure which someone else had mentioned sharing "I need time to myself, when things get overwhelming I tend to walk in nature."   [facility] Group Notes:  (Counselor/Nursing/MHT/Case Management/Adjunct)     Type of Therapy:  Group Therapy at 1:15 on 02/24/2012   Participation Level:  Active  Participation Quality:  Appropriate  Affect:  Flat  Cognitive:  Alert, Appropriate and Oriented  Insight:  Good  Engagement in Group:  Good  Engagement in Therapy:  Good  Modes of Intervention:  Clarification and Support  Summary of Progress/Problems:  Vanesha participated in activity in which patients choose photographs to represent what their life would look and feel like were it in balance and another for out of balance. She shared a photo of a path in the woods reiterating her mention earlier today of walking in nature as a coping mechanism.  She also chose a photo of a person with sticky notes of to-dos plastered on his head and shared "This is how I feel so much of the time, overwhelmed with things to do.  And I forget a lot of things so I tend to use sticky notes to help me remember and feel bad when I don't get everything done." Patient was asked what it would feel like to go home and take most of the sticky notes down and just have one list for each day; she was at a loss as to how to answer.  Another group member suggested it may feel  good; group facilitator suggested her main sticky note might be simply don't pick up a drink today and the rest will fall in place.   [name] 02/25/2012, 02/25/2012     ,

## 2012-02-24 NOTE — Progress Notes (Signed)
02/24/2012         Time: 1415      Group Topic/Focus: The focus of the group is on enhancing the patients' ability to utilize positive relaxation strategies by practicing several that can be used at discharge.  Participation Level: Active  Participation Quality: Attentive  Affect: Appropriate  Cognitive: Oriented   Additional Comments: None.   Aubrii Sharpless 02/24/2012 4:02 PM

## 2012-02-24 NOTE — Progress Notes (Signed)
[facility] Case Management Discharge Plan:  Will you be returning to the same living situation after discharge: Yes,  home At discharge, do you have transportation home?:Yes,  family Do you have the ability to pay for your medications:Yes,  $4.00 at [facility]  Interagency Information:     Release of information consent forms completed and in the chart;  Patient's signature needed at discharge.  Patient to Follow up at:  Follow-up Information    Follow up with [facility] [place]. (Walk-in Mon-Fri between 9 and 3 for a screening for services)    Contact information:   [address]  [place]  [phone]      Follow up with [facility] AA mtgs in 90 days and get a sponser.         Patient denies SI/HI:   Yes,  yes    Safety Planning and Suicide Prevention discussed:  Yes,  yes  Barrier to discharge identified:No.  Summary and Recommendations:   [name] [name] 02/24/2012, 3:44 PM

## 2012-02-24 NOTE — [facility] Suicide Risk Assessment (Signed)
Suicide Risk Assessment  Discharge Assessment      Demographic factors: See chart.  Current Mental Status Per Physician: Patient seen and evaluated. Chart reviewed. Patient stated that her mood was "good". Her affect was mood congruent and euthymic. She denied any current thoughts of self injurious behavior, suicidal ideation or homicidal ideation. There were no auditory or visual hallucinations, paranoia, delusional thought processes, or mania noted.  Thought process was linear and goal directed.  No psychomotor agitation or retardation was noted. Speech was slowed rate with decreased tone and volume. Eye contact was good. Judgment and insight are fair.  Patient has been up and engaged on the unit.  No acute safety concerns reported from team.    Loss Factors: mother and father both died in past 3 yrs  Historical Factors:  Adopted at age 22; [facility] 11/12 residential Tx; s/p OD and resp failure s/p acute intoxication; no sponsor in past  Risk Reduction Factors: AA; lives with ex-husband; daughter; sister  Discharge Diagnoses:   AXIS I:  Alcohol Dependence; Mood Disorder NOS; Complicated Grief  AXIS II: Deferred AXIS III:  Seasonal Allergies; Asthma Past Medical History  Diagnosis Date  . Respiratory failure with hypoxia   . Pneumonia   . Altered mental status     secondary to ethyl alcohol withdrawal  . Systemic inflammatory response syndrome   . COPD (chronic obstructive pulmonary disease)   . Tobacco abuse   . Diastolic congestive heart failure   . Depression   . HTN (hypertension)   . ETOH abuse   . Suicidal ideation    AXIS IV:  Moderate AXIS V:  50  Cognitive Features That Contribute To Risk: limited insight.  Suicide Risk: Pt viewed as a chronic increased risk of harm to self in light of her past hx and risk factors.  No acute safety concerns on the unit.  Pt contracting for safety and stable for discharge in am.  Plan Of Care/Follow-up recommendations: Pt to return  home to ex-husband, daughter will pick up around 1pm tomorrow.  Pt seen and evaluated. Chart reviewed.  Pt stable for and requesting discharge tomorrow. Pt contracting for safety and does not currently meet [facility] involuntary commitment criteria for continued hospitalization against her will.  VSS, d/c Librium taper.  Mental health treatment, medication management and continued sobriety will mitigate against the increased risk of harm to self and/or others.  Discussed the importance of recovery further with pt, as well as, tools to move forward in a healthy & safe manner.  Pt agreeable with the plan.  Discussed with the team.  Please see orders, follow up appointments per AVS ([facility]) and full discharge summary to be completed by physician extender.  Recommend follow up with AA.  Diet: Heart Healthy.  Activity: As tolerated.     Lupe Carney 02/24/2012, 3:13 PM

## 2012-02-25 ENCOUNTER — Other Ambulatory Visit (HOSPITAL_COMMUNITY): Payer: Self-pay

## 2012-02-25 DIAGNOSIS — J4489 Other specified chronic obstructive pulmonary disease: Secondary | ICD-10-CM

## 2012-02-25 DIAGNOSIS — J449 Chronic obstructive pulmonary disease, unspecified: Secondary | ICD-10-CM

## 2012-02-25 MED ORDER — TIOTROPIUM BROMIDE MONOHYDRATE 18 MCG IN CAPS: 18.0000 ug | ORAL_CAPSULE | Freq: Every day | RESPIRATORY_TRACT | Status: DC

## 2012-02-25 MED ORDER — FLUTICASONE PROPIONATE 50 MCG/ACT NA SUSP: NASAL | Status: DC

## 2012-02-25 MED ORDER — CITALOPRAM HYDROBROMIDE 40 MG PO TABS: 40.0000 mg | ORAL_TABLET | Freq: Every day | ORAL | Status: DC

## 2012-02-25 MED ORDER — LORATADINE 10 MG PO TABS: 10.0000 mg | ORAL_TABLET | Freq: Every day | ORAL | Status: DC

## 2012-02-25 NOTE — Progress Notes (Signed)
Patient appears asleep upon my assessment, no distress noted. Patient remains safe on unit with Q15 minute checks. Will continue to monitor.

## 2012-02-25 NOTE — Progress Notes (Signed)
[facility] Group Notes:  (Counselor/Nursing/MHT/Case Management/Adjunct)  02/25/2012 7:40 PM  Type of Therapy:  Group Therapy at 11  Participation Level:  Minimal  Participation Quality:  Attentive  Affect:  Flat  Cognitive:  Alert and Oriented  Engagement in Group:  Limited  Engagement in Therapy:  Limited  Modes of Intervention:  Education  Summary of Progress/Problems:  Pattijo was attentive to discussion on Post Acute Withdrawal Syndrome (PAWS).    [name] 02/25/2012, 7:40 PM  [facility] Group Notes:  (Counselor/Nursing/MHT/Case Management/Adjunct)  02/25/2012 7:41 PM  Type of Therapy:  Group Therapy at 1:15  Participation Level:  Minimal  Participation Quality:  Attentive and Sharing  Affect:  Appropriate  Cognitive:  Alert and Oriented  Insight:  Limited  Engagement in Group:  Limited  Engagement in Therapy:  Limited  Modes of Intervention:  Clarification, Socialization and Support  Summary of Progress/Problems: Shahidah shared that "if I was to wake up tomorrow and find that a miracle had occurred and things were better a sign that the miracle had occurred would be that I could feel inner peace."  Others agreed and discussion lead to what that might feel like, inner peace.  Rosealynn was open to considering concept that the miracle meant there was no more alcohol in her life.    [name] 02/25/2012, 7:41 PM

## 2012-02-25 NOTE — Discharge Summary (Signed)
Physician Discharge Summary Note  Patient:  [name] [name] is an 61 y.o., female MRN:  [id number] DOB:  [birth year] Patient phone:  [phone] (home)  Patient address:   [address] [place] [address],   Date of Admission:  02/21/2012 Date of Discharge: 02/25/2012  Discharge Diagnoses:  AXIS I: Alcohol Dependence; Mood Disorder NOS; Complicated Grief  AXIS II: Deferred  AXIS III: Seasonal Allergies; Asthma  Past Medical History   Diagnosis  Date   .  Respiratory failure with hypoxia    .  Pneumonia    .  Altered mental status      secondary to ethyl alcohol withdrawal   .  Systemic inflammatory response syndrome    .  COPD (chronic obstructive pulmonary disease)    .  Tobacco abuse    .  Diastolic congestive heart failure    .  Depression    .  HTN (hypertension)    .  ETOH abuse    .  Suicidal ideation    AXIS IV: Moderate  AXIS V: 50  Level of Care:  OP  Hospital Course:  First admission for [name] [name] whose sisters called emergency services yesterday when they realized that she was speaking nonsensically on the phone. [name] reported that she relapsed once on alcohol in early April for a short episode, after 6 months of abstinence after leaving [facility] in October 2012.   She maintained sobriety for another month until 2 days ago, and she began drinking whiskey , orange liqueur, and some beers. She cited depression and grief over the death of her mother 2 years ago, as triggers for relapse. She stated at admission "Today I just want to cry and go to sleep."   She was detoxed uneventfully with Librium and was very encouraged by what she learned in group therapy. She was started on Celexa for depression. She was doing well and asking for discharge on May 31 and following up with outpatient treatment.   Consults:  None  Discharge Vitals:   Blood pressure 91/68, pulse 87, temperature 97.7 F (36.5 C), temperature source Oral, resp. rate  16, height 5' 1.5" (1.562 m), weight 80.287 kg (177 lb), SpO2 96.00%.  Mental Status Exam: See Mental Status Examination and Suicide Risk Assessment completed by Attending Physician prior to discharge.  Discharge destination:  Home  Is patient on multiple antipsychotic therapies at discharge:  No   Has Patient had three or more failed trials of antipsychotic monotherapy by history:  No  Recommended Plan for Multiple Antipsychotic Therapies: N/A   Medication List  As of 02/25/2012 11:47 AM   ASK your doctor about these medications      Indication    citalopram 40 MG tablet   Commonly known as: CELEXA   Take 40 mg by mouth daily. For depression       PROAIR HFA 108 (90 BASE) MCG/ACT inhaler   Generic drug: albuterol   Inhale 2 puffs into the lungs every 4 (four) hours as needed. For COPD       tiotropium 18 MCG inhalation capsule   Commonly known as: SPIRIVA   Place 1 capsule (18 mcg total) into inhaler and inhale daily.For COPD             Follow-up Information    Follow up with [facility]. (Walk-in Mon-Fri between 9 and 3 for a screening for services)    Contact information:   [address]  [place]  [phone]      Follow up with [facility] mt[facility] in 90 days and get a sponser.         Follow-up recommendations:  Activity:  unrestricted Diet:  regular  Signed: Javaeh Muscatello A 02/25/2012, 11:47 AM

## 2012-02-25 NOTE — Treatment Plan (Signed)
Interdisciplinary Treatment Plan Update (Adult)  Date: 02/25/2012  Time Reviewed: 3:11 PM   Progress in Treatment: Attending groups: Yes Participating in groups: Yes Taking medication as prescribed: Yes Tolerating medication: Yes   Family/Significant othe contact made:   Patient understands diagnosis:  Yes Discussing patient identified problems/goals with staff:  Yes Medical problems stabilized or resolved:  Yes Denies suicidal/homicidal ideation: Yes  In AM group Issues/concerns per patient self-inventory:  None noted Other:  New problem(s) identified: N/A  Reason for Continuation of Hospitalization: Other; describe D/C tdday  Interventions implemented related to continuation of hospitalization:   Additional comments:  Estimated length of stay: D/C today  Discharge Plan: Return home, follow up outpt  New goal(s): N/A  Review of initial/current patient goals per problem list:   1.  Goal(s): Safely detox from alcohol  Met:  Yes  Target date:5/31  As evidenced by:no withdrawal symptoms  2.  Goal (s):Eliminate SI  Met:  Yes  Target date:See previous plan  As evidenced by:  3.  Goal(s):Stabilize mood  Met:  Yes  Target date:5/31  As evidenced ZO:[id number] denies depression  4.  Goal(s):Identify comprehensive sobriety plan  Met:  Yes  Target date:5/31  As evidenced VW:[id number] will follow up at [facility], go to AA mtgs at the [employer] get a sponsor  Attendees: Patient:     Family:     Physician:  Lynann Bologna NP 02/25/2012 3:11 PM   Nursing:    02/25/2012 3:11 PM   Case Manager:  Richelle Ito, LCSW 02/25/2012 3:11 PM   Counselor:   02/25/2012 3:11 PM   Other:     Other:     Other:     Other:      Scribe for Treatment Team:   Ida Rogue, 02/25/2012 3:11 PM

## 2012-02-25 NOTE — Progress Notes (Signed)
Pt d/c from hospital with her daughter. All items returned. D/C instructions given, samples given and prescriptions given. Pt denies si and hi.

## 2012-02-28 NOTE — Progress Notes (Signed)
Patient Discharge Instructions:  After Visit Summary (AVS):   Faxed to:  02/28/2012 Psychiatric Admission Assessment Note:   Faxed to:  02/28/2012 Suicide Risk Assessment - Discharge Assessment:   Faxed to:  02/28/2012 Faxed/Sent to the Next Level Care provider:  02/28/2012  Faxed to: [facility] @ [phone]  Karleen Hampshire Brittini, 02/28/2012, 4:49 PM

## 2012-10-01 ENCOUNTER — Emergency Department (HOSPITAL_COMMUNITY)
Admission: EM | Admit: 2012-10-01 | Discharge: 2012-10-02 | Disposition: A | Discharge status: Other Acute Inpatient Hospital | Payer: Medicaid Other | Attending: Emergency Medicine | Admitting: Emergency Medicine

## 2012-10-01 ENCOUNTER — Encounter (HOSPITAL_COMMUNITY): Payer: Self-pay

## 2012-10-01 DIAGNOSIS — I5030 Unspecified diastolic (congestive) heart failure: Secondary | ICD-10-CM | POA: Insufficient documentation

## 2012-10-01 DIAGNOSIS — F10929 Alcohol use, unspecified with intoxication, unspecified: Secondary | ICD-10-CM

## 2012-10-01 DIAGNOSIS — Z8679 Personal history of other diseases of the circulatory system: Secondary | ICD-10-CM | POA: Insufficient documentation

## 2012-10-01 DIAGNOSIS — J9610 Chronic respiratory failure, unspecified whether with hypoxia or hypercapnia: Secondary | ICD-10-CM | POA: Insufficient documentation

## 2012-10-01 DIAGNOSIS — T1491XA Suicide attempt, initial encounter: Secondary | ICD-10-CM

## 2012-10-01 DIAGNOSIS — F1010 Alcohol abuse, uncomplicated: Secondary | ICD-10-CM | POA: Insufficient documentation

## 2012-10-01 DIAGNOSIS — F329 Major depressive disorder, single episode, unspecified: Secondary | ICD-10-CM | POA: Insufficient documentation

## 2012-10-01 DIAGNOSIS — S51809A Unspecified open wound of unspecified forearm, initial encounter: Secondary | ICD-10-CM | POA: Insufficient documentation

## 2012-10-01 DIAGNOSIS — I10 Essential (primary) hypertension: Secondary | ICD-10-CM | POA: Insufficient documentation

## 2012-10-01 DIAGNOSIS — F3289 Other specified depressive episodes: Secondary | ICD-10-CM | POA: Insufficient documentation

## 2012-10-01 DIAGNOSIS — F17200 Nicotine dependence, unspecified, uncomplicated: Secondary | ICD-10-CM | POA: Insufficient documentation

## 2012-10-01 DIAGNOSIS — Z79899 Other long term (current) drug therapy: Secondary | ICD-10-CM | POA: Insufficient documentation

## 2012-10-01 DIAGNOSIS — X789XXA Intentional self-harm by unspecified sharp object, initial encounter: Secondary | ICD-10-CM | POA: Insufficient documentation

## 2012-10-01 DIAGNOSIS — Z23 Encounter for immunization: Secondary | ICD-10-CM | POA: Insufficient documentation

## 2012-10-01 LAB — CBC WITH DIFFERENTIAL/PLATELET
Basophils Absolute: 0 10*3/uL (ref 0.0–0.1)
Basophils Relative: 0 % (ref 0–1)
Eosinophils Relative: 1 % (ref 0–5)
HCT: 44.5 % (ref 36.0–46.0)
MCHC: 33.9 g/dL (ref 30.0–36.0)
Monocytes Absolute: 0.7 10*3/uL (ref 0.1–1.0)
Neutro Abs: 4.1 10*3/uL (ref 1.7–7.7)
Platelets: 251 10*3/uL (ref 150–400)
RDW: 13.5 % (ref 11.5–15.5)

## 2012-10-01 LAB — COMPREHENSIVE METABOLIC PANEL
ALT: 46 U/L — ABNORMAL HIGH (ref 0–35)
AST: 79 U/L — ABNORMAL HIGH (ref 0–37)
Albumin: 3.5 g/dL (ref 3.5–5.2)
Calcium: 9.1 mg/dL (ref 8.4–10.5)
Chloride: 98 mEq/L (ref 96–112)
Creatinine, Ser: 0.85 mg/dL (ref 0.50–1.10)
Sodium: 136 mEq/L (ref 135–145)

## 2012-10-01 LAB — RAPID URINE DRUG SCREEN, HOSP PERFORMED
Barbiturates: NOT DETECTED
Benzodiazepines: NOT DETECTED
Cocaine: NOT DETECTED
Tetrahydrocannabinol: NOT DETECTED

## 2012-10-01 MED ORDER — VILAZODONE HCL 20 MG PO TABS
1.0000 | ORAL_TABLET | Freq: Every day | ORAL | Status: DC
  Administered 2012-10-01 – 2012-10-02 (×2): 20 mg via ORAL
  Filled 2012-10-01 (×3): qty 1

## 2012-10-01 MED ORDER — ONDANSETRON HCL 4 MG PO TABS: 4.0000 mg | ORAL_TABLET | Freq: Three times a day (TID) | ORAL | Status: DC | PRN

## 2012-10-01 MED ORDER — ALBUTEROL SULFATE HFA 108 (90 BASE) MCG/ACT IN AERS: 2.0000 | INHALATION_SPRAY | RESPIRATORY_TRACT | Status: DC | PRN

## 2012-10-01 MED ORDER — ALUM & MAG HYDROXIDE-SIMETH 200-200-20 MG/5ML PO SUSP: 30.0000 mL | ORAL | Status: DC | PRN

## 2012-10-01 MED ORDER — TETANUS-DIPHTH-ACELL PERTUSSIS 5-2.5-18.5 LF-MCG/0.5 IM SUSP
0.5000 mL | Freq: Once | INTRAMUSCULAR | Status: AC
  Administered 2012-10-01: 0.5 mL via INTRAMUSCULAR
  Filled 2012-10-01: qty 0.5

## 2012-10-01 MED ORDER — CITALOPRAM HYDROBROMIDE 10 MG PO TABS
10.0000 mg | ORAL_TABLET | Freq: Every day | ORAL | Status: DC
  Administered 2012-10-01 – 2012-10-02 (×2): 10 mg via ORAL
  Filled 2012-10-01 (×3): qty 1

## 2012-10-01 MED ORDER — NICOTINE 21 MG/24HR TD PT24
21.0000 mg | MEDICATED_PATCH | Freq: Every day | TRANSDERMAL | Status: DC
  Administered 2012-10-01 – 2012-10-02 (×2): 21 mg via TRANSDERMAL
  Filled 2012-10-01 (×2): qty 1
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
## 2012-10-01 NOTE — ED Notes (Signed)
[id number]<AV> Expected date:10/01/12<BR> Expected time: 6:18 PM<BR> Means of arrival:<BR> Comments:<BR> Triage per [facility]
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
## 2012-10-01 NOTE — Progress Notes (Signed)
Dr. Ethelda Chick contacted [facility] and request an assessment.  Incoming ACT will see pt after 1900.  Pt via report is depressed due to recent break up.  Polite and has psychiatrist services in place.

## 2012-10-01 NOTE — ED Notes (Signed)
Offered pt water to drink

## 2012-10-02 ENCOUNTER — Other Ambulatory Visit: Payer: Self-pay

## 2012-10-02 ENCOUNTER — Inpatient Hospital Stay (HOSPITAL_COMMUNITY)
Admission: AD | Admit: 2012-10-02 | Discharge: 2012-10-06 | DRG: 897 | Disposition: A | Discharge status: Home / Self Care | Payer: Medicaid Other | Source: Ambulatory Visit | Attending: Psychiatry | Admitting: Psychiatry

## 2012-10-02 ENCOUNTER — Encounter (HOSPITAL_COMMUNITY): Payer: Self-pay | Admitting: *Deleted

## 2012-10-02 DIAGNOSIS — I509 Heart failure, unspecified: Secondary | ICD-10-CM | POA: Diagnosis present

## 2012-10-02 DIAGNOSIS — F1020 Alcohol dependence, uncomplicated: Principal | ICD-10-CM | POA: Diagnosis present

## 2012-10-02 DIAGNOSIS — F17200 Nicotine dependence, unspecified, uncomplicated: Secondary | ICD-10-CM

## 2012-10-02 DIAGNOSIS — I10 Essential (primary) hypertension: Secondary | ICD-10-CM | POA: Diagnosis present

## 2012-10-02 DIAGNOSIS — F329 Major depressive disorder, single episode, unspecified: Secondary | ICD-10-CM | POA: Diagnosis present

## 2012-10-02 DIAGNOSIS — Z79899 Other long term (current) drug therapy: Secondary | ICD-10-CM

## 2012-10-02 DIAGNOSIS — I5030 Unspecified diastolic (congestive) heart failure: Secondary | ICD-10-CM | POA: Diagnosis present

## 2012-10-02 DIAGNOSIS — J449 Chronic obstructive pulmonary disease, unspecified: Secondary | ICD-10-CM | POA: Diagnosis present

## 2012-10-02 DIAGNOSIS — J4489 Other specified chronic obstructive pulmonary disease: Secondary | ICD-10-CM | POA: Diagnosis present

## 2012-10-02 MED ORDER — ONDANSETRON HCL 4 MG PO TABS: 4.0000 mg | ORAL_TABLET | Freq: Three times a day (TID) | ORAL | Status: DC | PRN

## 2012-10-02 MED ORDER — ALBUTEROL SULFATE HFA 108 (90 BASE) MCG/ACT IN AERS: 2.0000 | INHALATION_SPRAY | RESPIRATORY_TRACT | Status: DC | PRN

## 2012-10-02 MED ORDER — MAGNESIUM HYDROXIDE 400 MG/5ML PO SUSP: 30.0000 mL | Freq: Every day | ORAL | Status: DC | PRN

## 2012-10-02 MED ORDER — ACETAMINOPHEN 325 MG PO TABS
650.0000 mg | ORAL_TABLET | Freq: Four times a day (QID) | ORAL | Status: DC | PRN
  Administered 2012-10-02 (×2): 650 mg via ORAL
  Filled 2012-10-02: qty 2

## 2012-10-02 MED ORDER — ALUM & MAG HYDROXIDE-SIMETH 200-200-20 MG/5ML PO SUSP: 30.0000 mL | ORAL | Status: DC | PRN

## 2012-10-02 MED ORDER — VILAZODONE HCL 20 MG PO TABS
1.0000 | ORAL_TABLET | Freq: Every day | ORAL | Status: DC
  Administered 2012-10-03: 20 mg via ORAL
  Filled 2012-10-02 (×2): qty 1

## 2012-10-02 MED ORDER — NICOTINE 21 MG/24HR TD PT24
21.0000 mg | MEDICATED_PATCH | Freq: Every day | TRANSDERMAL | Status: DC
  Administered 2012-10-03 – 2012-10-06 (×4): 21 mg via TRANSDERMAL
  Filled 2012-10-02 (×7): qty 1

## 2012-10-02 MED ORDER — ACETAMINOPHEN 325 MG PO TABS
ORAL_TABLET | ORAL | Status: AC
  Filled 2012-10-02: qty 2

## 2012-10-02 MED ORDER — ACETAMINOPHEN 325 MG PO TABS
650.0000 mg | ORAL_TABLET | Freq: Four times a day (QID) | ORAL | Status: DC | PRN
  Administered 2012-10-03: 650 mg via ORAL

## 2012-10-02 MED ORDER — CITALOPRAM HYDROBROMIDE 10 MG PO TABS
10.0000 mg | ORAL_TABLET | Freq: Every day | ORAL | Status: DC
  Administered 2012-10-03: 10 mg via ORAL
  Filled 2012-10-02 (×2): qty 1

## 2012-10-02 NOTE — Tx Team (Signed)
Initial Interdisciplinary Treatment Plan  PATIENT STRENGTHS: (choose at least two) Ability for insight  PATIENT STRESSORS: Substance abuse   PROBLEM LIST: Problem List/Patient Goals Date to be addressed Date deferred Reason deferred Estimated date of resolution  SUICIDAL IDEATION 10/02/12     DEPRESSION                                                 DISCHARGE CRITERIA:  Adequate post-discharge living arrangements Improved stabilization in mood, thinking, and/or behavior Reduction of life-threatening or endangering symptoms to within safe limits Verbal commitment to aftercare and medication compliance  PRELIMINARY DISCHARGE PLAN: Outpatient therapy Return to previous living arrangement Return to previous work or school arrangements  PATIENT/FAMIILY INVOLVEMENT: This treatment plan has been presented to and reviewed with the patient, [name] [name], and/or family member, PT.  The patient and family have been given the opportunity to ask questions and make suggestions.  [name] 10/02/2012, 7:46 PM

## 2012-10-02 NOTE — ED Notes (Signed)
ACT team member at bedside 
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
## 2012-10-02 NOTE — [facility] Counselor (Signed)
Patient [name] [name]) accepted to [facility] 10/02/2012. Patient's Axis I diagnosis is Alcohol Dependence and Substance Induced Mood Disorder. The accepting physician is Dr. Geoffery Lyons. Patients room number is 304-2.  Writer contacted EDP-Dr. Dana Allan and made him aware of patients disposition. Patients nurse also made aware. Call report # is [phone]. All support paperwork completed and faxed to [facility].

## 2012-10-02 NOTE — ED Notes (Signed)
Took pt a cup of coffee 

## 2012-10-02 NOTE — ED Provider Notes (Signed)
The patient was seen and evaluated by the psychiatrist Dr. Jacky Kindle MD, who recommended admission to a psychiatric unit  Lyanne Co, MD 10/02/12 [phone]

## 2012-10-02 NOTE — ED Notes (Signed)
Pt c/o headache and requesting Tylenol.

## 2012-10-02 NOTE — ED Notes (Signed)
Patient is resting comfortably. 

## 2012-10-02 NOTE — ED Provider Notes (Signed)
1730.  Pt has been accepted for treatment at [facility].  No acute events have been reported.  Will facilitate the transfer.  Tobin Chad, MD 10/02/12 [phone]

## 2012-10-02 NOTE — ED Notes (Signed)
Took pt a cup of water 

## 2012-10-02 NOTE — ED Notes (Signed)
Pt resting quietly with family at bedside. Pt calm, cooperative. VSS. Pt with no other complaints at this time.

## 2012-10-03 ENCOUNTER — Encounter (HOSPITAL_COMMUNITY): Payer: Self-pay | Admitting: Psychiatry

## 2012-10-03 DIAGNOSIS — F1020 Alcohol dependence, uncomplicated: Principal | ICD-10-CM

## 2012-10-03 DIAGNOSIS — F411 Generalized anxiety disorder: Secondary | ICD-10-CM

## 2012-10-03 DIAGNOSIS — F339 Major depressive disorder, recurrent, unspecified: Secondary | ICD-10-CM

## 2012-10-03 MED ORDER — VILAZODONE HCL 20 MG PO TABS: 30.0000 | ORAL_TABLET | Freq: Every day | ORAL | Status: DC

## 2012-10-03 MED ORDER — IBUPROFEN 200 MG PO TABS
400.0000 mg | ORAL_TABLET | Freq: Four times a day (QID) | ORAL | Status: DC | PRN
  Administered 2012-10-03 – 2012-10-06 (×2): 400 mg via ORAL
  Filled 2012-10-03 (×3): qty 2

## 2012-10-03 MED ORDER — VILAZODONE HCL 20 MG PO TABS
30.0000 mg | ORAL_TABLET | Freq: Every day | ORAL | Status: DC
  Administered 2012-10-04 – 2012-10-06 (×3): 30 mg via ORAL
  Filled 2012-10-03 (×2): qty 1.5
  Filled 2012-10-03: qty 11
  Filled 2012-10-03 (×2): qty 1.5

## 2012-10-03 NOTE — Progress Notes (Signed)
Psychoeducational Group Note  Date:  10/03/2012 Time:  2000  Group Topic/Focus:  AA  Participation Level:  Active  Participation Quality:  Appropriate  Affect:  Appropriate  Cognitive:  Appropriate  Insight:  Engaged  Engagement in Group:  Engaged  Additional Comments:    Humberto Seals Monique 10/03/2012, 10:12 PM
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
## 2012-10-03 NOTE — [facility] Suicide Risk Assessment (Signed)
Suicide Risk Assessment  Admission Assessment     Nursing information obtained from:  Patient Demographic factors:  Living alone;Unemployed Current Mental Status:  Self-harm thoughts;Self-harm behaviors Loss Factors:  NA Historical Factors:  Prior suicide attempts;Family history of mental illness or substance abuse Risk Reduction Factors:  Living with another person, especially a relative  CLINICAL FACTORS:   Depression:   Comorbid alcohol abuse/dependence Alcohol/Substance Abuse/Dependencies  COGNITIVE FEATURES THAT CONTRIBUTE TO RISK: none identified   SUICIDE RISK:   Mild:  Suicidal ideation of limited frequency, intensity, duration, and specificity.  There are no identifiable plans, no associated intent, mild dysphoria and related symptoms, good self-control (both objective and subjective assessment), few other risk factors, and identifiable protective factors, including available and accessible social support.  PLAN OF CARE: Detox/supportive approach/coping skills/relapse prevention                              Increase the Viibryd to 30 mg                              D/C the Celexa   [name] [name] A 10/03/2012, 7:06 PM

## 2012-10-03 NOTE — Progress Notes (Signed)
Psychoeducational Group Note  Date:  10/03/2012 Time:  1100  Group Topic/Focus:  Recovery Goals:   The focus of this group is to identify appropriate goals for recovery and establish a plan to achieve them.  Participation Level:  Active  Participation Quality:  Appropriate  Affect:  Appropriate  Cognitive:  Appropriate  Insight:  Engaged  Engagement in Group:  Engaged  Additional Comments:  Pt was appropriate and attentive while attending group. Pt shared that she wants to change people, places, and things. Pt states that she admits that she has a problem and that coping skills will not work unless her attitude changes.   Sharyn Lull 10/03/2012, 1:27 PM

## 2012-10-03 NOTE — Progress Notes (Signed)
Patient ID: [name] [name], female   DOB: 1951/05/26, 62 y.o.   MRN: [id number] She has been up and to groups interacting with peers and staff. Self inventory: depression 8, hopelessness 7, one w/d symptom did not request medication,  and denies thought of SI. She requested and received prn medication for c/o headache twice 2nd dose was effective.  She wants to go for long term treatment. Daughter wants her to go  3 months or longer because she had been in shorted treatment programs and treatment was not effective.
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
## 2012-10-03 NOTE — Clinical Social Work Note (Signed)
[facility] LCSW Group Therapy   Type of Therapy:  Group Therapy  Participation Level:  Active  Participation Quality:  Attentive  Affect:  Appropriate  Cognitive:  Appropriate  Engagement in Therapy:  Engaged  Modes of Intervention:  Discussion, Education, Rapport Building, Socialization and Support  Summary of Progress/Problems: [name] [name] attended group session facilitated by Agricultural consultant from [employer] [facility] [place]. Volunteer shared his experiences and services offered by [facility]. [name] 10/03/2012 4:47 PM

## 2012-10-03 NOTE — Progress Notes (Signed)
Psychoeducational Group Note  Date:  10/03/2012 Time:  1000  Group Topic/Focus:  Pt participated in coping skills pictionary activity. Pt was asked to identify one coping skill they could use that they learned from the game.  Participation Level:  Active  Participation Quality:  Appropriate  Affect:  Appropriate  Cognitive:  Appropriate  Insight:  Engaged  Engagement in Group:  Engaged  Additional Comments:  Shamikia was active in group and went to the board to draw for pictionary. She said one coping skill she can use outside of the hospital is walking.   Alyson Reedy 10/03/2012, 11:16 AM
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
## 2012-10-04 DIAGNOSIS — F329 Major depressive disorder, single episode, unspecified: Secondary | ICD-10-CM

## 2012-10-04 MED ORDER — LISINOPRIL 10 MG PO TABS
10.0000 mg | ORAL_TABLET | Freq: Every day | ORAL | Status: DC
  Administered 2012-10-04 – 2012-10-06 (×3): 10 mg via ORAL
  Filled 2012-10-04 (×3): qty 1
  Filled 2012-10-04: qty 7
  Filled 2012-10-04: qty 1

## 2012-10-04 NOTE — Progress Notes (Signed)
CSW attempted to contact patient's daughter, Efraim Kaufmann at [phone], on 1/7 and also today, 1/8, as daughter apparently has concerns about patient needing more inpatient treatment than she has heard we will be setting up.  Carney Bern, LCSWA

## 2012-10-04 NOTE — Progress Notes (Signed)
Patient ID: [name] [name], female   DOB: 05/11/1951, 62 y.o.   MRN: [id number] She has been up and to groups intereacting with peers and staff. Self inventory: depression 8, hopeless 6  W/d chilling, denies SI thoughts. She was started on lisinopril today for her  B/p.

## 2012-10-04 NOTE — Progress Notes (Signed)
Psychoeducational Group Note  Date:  10/04/2012 Time:  1100  Group Topic/Focus:  Personal Choices and Values:   The focus of this group is to help patients assess and explore the importance of values in their lives, how their values affect their decisions, how they express their values and what opposes their expression.  Participation Level:  Active  Participation Quality:  Appropriate, Attentive and Sharing  Affect:  Appropriate  Cognitive:  Appropriate  Insight:  Engaged  Engagement in Group:  Engaged  Additional Comments:  Pt was appropriate and attentive while attending group. Pt shared that his top three values are getting approval, relationship with God, and having dignity and pride. Pt wants to grow in smelling the flowers.   Sharyn Lull 10/04/2012, 12:08 PM

## 2012-10-04 NOTE — Clinical Social Work Note (Signed)
[facility] LCSW Group Therapy   Type of Therapy:  Group Therapy  Participation Level:  Active  Participation Quality:  Appropriate  Affect:  Appropriate  Cognitive:  Appropriate  Insight:  Improving  Engagement in Therapy:  Engaged  Modes of Intervention:  Discussion, Exploration and Support  Summary of Progress/Problems:  Pt shared that emotional regulation for her "would be putting herself first for once verses what a man thinks."  "It's hard to believe I let something like a man become more important than my sobriety; I won't do that again, but then I know this is something I usually do, put the man ahead of myself."  Patient was able to process the high cost of this behavior on a personal level.  [name] 10/04/2012 8:16 PM
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
## 2012-10-04 NOTE — Clinical Social Work Note (Signed)
[facility] LCSW Group Therapy   Type of Therapy:  Discharge Planning  Participation Level:  Active  Participation Quality:  Appropriate  Affect:  Appropriate  Cognitive:  Alert and Oriented  Insight:  Improving  Engagement in Therapy:  Engaged  Modes of Intervention:  Clarification, Exploration and Support  Summary of Progress/Problems:  Pt denied both suicidal ideation and homicidal ideation.  On a scale of 1 to 10 with ten being the most ever experienced, the patient rates depression at a 5 and anxiety at a 8. She is most anxious as her daughter believes she needs to enter long term treatment center; "I want to get back to my AA people and get on with my life but she seems to think I need mental hospital and she is like a bull with a red cape."  Patient shared with group that she looks forward to learning how to live this year.    [name] 10/04/2012 8:15 PM

## 2012-10-04 NOTE — Clinical Social Work Note (Signed)
Interdisciplinary Treatment Plan Update (Adult)  Date: 10/04/2012  Time Reviewed: 10:08 AM   Progress in Treatment: Attending groups: Yes Participating in groups: Yes Taking medication as prescribed:  Yes Tolerating medication:  Yes Family/Significant othe contact made: Not as yet, one attempt made on 10/03/2012  Patient understands diagnosis: Yes Discussing patient identified problems/goals with staff: Yes Medical problems stabilized or resolved:  Yes Denies suicidal/homicidal ideation: Yes Issues/concerns per patient self-inventory: None Other: N/A  New problem(s) identified: None Identified  Reason for Continuation of Hospitalization: Medical Issues increased blood pressure Withdrawal symptoms Other; describe determine follow up plan  Interventions implemented related to continuation of hospitalization: mood stabilization, medication monitoring and adjustment, group therapy and psycho education, suicide risk assessment, collateral contact, aftercare planning, ongoing physician assessments and safety checks q 15 mins  Additional comments: N/A  Estimated length of stay: 3-5 days  Discharge Plan:  CSW is assessing for appropriate referrals.   New goal(s): N/A  Review of initial/current patient goals per problem list:    1. Goal(s): Address suicidal ideation  Met: yes & no Target date: 1-3 days  As evidenced by: patient reports no SI yet need to provide suicide prevention education to pt's daughter 2. Goal (s): Reduce depressive symptoms from a 10 to a 3  Met: No  Target date: 1-3 days  As evidenced by: Pt rates at a 5   3.  Goal(s): Stabilize mood through use of medications and therapeutic milieu   Met:  No  Target date: 1-3 days  As evidenced by Patient report and physician assessment 5. Goal(s): Identify comprehensive mental wellness and sobriety plan  Met: No  Target date: 1-3 days As evidenced JX:BJYN report, with support of CSW   Attendees: Patient:       Family:     Physician:  Geoffery Lyons 10/04/2012 10:09 AM   Nursing:   Roswell Miners, RN 10/04/2012 10:09 AM   Clinical Social Worker Ronda Fairly 10/04/2012 10:09 AM   Other:  Olivia Mackie, PhD Intern 10/04/2012 10:09 AM   Other:  Serena Colonel. NP 10/04/2012 10:09 AM   Other:   10/04/2012 10:09 AM   Other:   10/04/2012 10:09 AM    Scribe for Treatment Team:   Carney Bern, LCSWA  10/04/2012 9:09 PM

## 2012-10-05 NOTE — Progress Notes (Signed)
CSW met with pt's daughter, Charmaine Downs (phone [phone]), at 6:30 PM as daughter was here to share meal with patient. Melissa shared that patient has been using alcohol on and off for four months verses the 2 days she originally reported. Patient is also being monitored at [facility] for liver disease and gets violently ill when she consumes alcohol. CSW has been in contact with [facility] who is current provider to set up out patient services, CSW reevaluating follow up plan and will discuss with physican tomorrow.  Carney Bern, LCSWA Clinical Social Worker [phone]

## 2012-10-05 NOTE — Progress Notes (Signed)
Patient did attend the evening karaoke group. Pt was attentive and supportive.   

## 2012-10-05 NOTE — Progress Notes (Signed)
Psychoeducational Group Note  Date:  10/05/2012 Time:  1100  Group Topic/Focus:  Rediscovering Joy:   The focus of this group is to explore various ways to relieve stress in a positive manner.  Participation Level:  Active  Participation Quality:  Appropriate  Affect:  Appropriate  Cognitive:  Appropriate  Insight:  Engaged  Engagement in Group:  Engaged  Additional Comments:  Pt was able to positively share with the group.  Gurleen Larrivee E 10/05/2012, 1:51 PM

## 2012-10-05 NOTE — Progress Notes (Signed)
[facility] MD Progress Note  10/05/2012 4:04 PM [name] AVERHART  MRN:  [id number] Subjective:  [name] [name] endorses that she does not see the need to go to rehab. She has identified the factors that contributed to her relapse and she has a plan of how to address them. She admits she got burned out attending to her ex husband and the lady she was taking care of. She was not doing much to take care of herself. Then she got involved with the guy, and she now realizes that so early in her recovery it was not a good idea. She plans to go to meetings, work with her sponsor. Diagnosis:   Axis I: Alcohol Dependence, Major Depression recurrent Axis II: Deferred Axis III:  Past Medical History  Diagnosis Date  . Respiratory failure with hypoxia   . Pneumonia   . Altered mental status     secondary to ethyl alcohol withdrawal  . Systemic inflammatory response syndrome   . Tobacco abuse   . Diastolic congestive heart failure   . Depression   . HTN (hypertension)   . ETOH abuse   . Suicidal ideation   . COPD (chronic obstructive pulmonary disease)    Axis IV: occupational problems and problems with primary support group Axis V: 61-70 mild symptoms  ADL's:  Intact  Sleep: Fair  Appetite:  Fair  Suicidal Ideation:  Plan:  Denies Intent:  Denies Means:  Denies Homicidal Ideation:  Plan:  Denies Intent:  Denies Means:  Denies AEB (as evidenced by):  Psychiatric Specialty Exam: Review of Systems  Constitutional: Negative.   HENT: Negative.   Eyes: Negative.   Respiratory: Negative.   Cardiovascular: Negative.   Gastrointestinal: Negative.   Genitourinary: Negative.   Musculoskeletal: Negative.   Skin: Negative.   Neurological: Negative.   Endo/Heme/Allergies: Negative.   Psychiatric/Behavioral: Positive for depression and substance abuse.    Blood pressure 143/88, pulse 87, temperature 97.8 F (36.6 C), temperature source Oral, resp. rate 16, height 5\' 2"  (1.575 m), weight 82.555 kg (182  lb).Body mass index is 33.29 kg/(m^2).  General Appearance: Fairly Groomed  Patent attorney::  Fair  Speech:  Clear and Coherent and Normal Rate  Volume:  Normal  Mood:  better  Affect:  Appropriate  Thought Process:  Coherent and Goal Directed  Orientation:  Full (Time, Place, and Person)  Thought Content:  WDL  Suicidal Thoughts:  No  Homicidal Thoughts:  No  Memory:  Immediate;   Fair Recent;   Fair Remote;   Fair  Judgement:  Fair  Insight:  Present  Psychomotor Activity:  Normal  Concentration:  Fair  Recall:  Fair  Akathisia:  No  Handed:  Right  AIMS (if indicated):     Assets:  Desire for Improvement Housing Social Support Transportation  Sleep:  Number of Hours: 6.75    Current Medications: Current Facility-Administered Medications  Medication Dose Route Frequency Provider Last Rate Last Dose  . acetaminophen (TYLENOL) tablet 650 mg  650 mg Oral Q6H PRN Verne Spurr, PA-C   650 mg at 10/03/12 4098  . albuterol (PROVENTIL HFA;VENTOLIN HFA) 108 (90 BASE) MCG/ACT inhaler 2 puff  2 puff Inhalation Q4H PRN Verne Spurr, PA-C      . alum & mag hydroxide-simeth (MAALOX/MYLANTA) 200-200-20 MG/5ML suspension 30 mL  30 mL Oral PRN Verne Spurr, PA-C      . ibuprofen (ADVIL,MOTRIN) tablet 400 mg  400 mg Oral Q6H PRN Rachael Fee, MD   400 mg at 10/03/12  1142  . lisinopril (PRINIVIL,ZESTRIL) tablet 10 mg  10 mg Oral Daily Sanjuana Kava, NP   10 mg at 10/05/12 0810  . magnesium hydroxide (MILK OF MAGNESIA) suspension 30 mL  30 mL Oral Daily PRN Verne Spurr, PA-C      . nicotine (NICODERM CQ - dosed in mg/24 hours) patch 21 mg  21 mg Transdermal Daily Verne Spurr, PA-C   21 mg at 10/05/12 0810  . ondansetron (ZOFRAN) tablet 4 mg  4 mg Oral Q8H PRN Verne Spurr, PA-C      . Vilazodone HCl TABS 30 mg  30 mg Oral Daily Rachael Fee, MD   30 mg at 10/05/12 1610    Lab Results: No results found for this or any previous visit (from the past 48 hour(s)).  Physical  Findings: AIMS: Facial and Oral Movements Muscles of Facial Expression: None, normal Lips and Perioral Area: None, normal Jaw: None, normal Tongue: None, normal,Extremity Movements Upper (arms, wrists, hands, fingers): None, normal Lower (legs, knees, ankles, toes): None, normal, Trunk Movements Neck, shoulders, hips: None, normal, Overall Severity Severity of abnormal movements (highest score from questions above): None, normal Incapacitation due to abnormal movements: None, normal Patient's awareness of abnormal movements (rate only patient's report): No Awareness, Dental Status Current problems with teeth and/or dentures?: No Does patient usually wear dentures?: No  CIWA:    COWS:     Treatment Plan Summary: Daily contact with patient to assess and evaluate symptoms and progress in treatment Medication management  Plan: Complete detox           Relapse prevention            Consider increasing the Viibryd to 40 mg  Medical Decision Making Problem Points:  Established problem, stable/improving (1), Review of last therapy session (1) and Review of psycho-social stressors (1) Data Points:  Review of medication regiment & side effects (2)  I certify that inpatient services furnished can reasonably be expected to improve the patient's condition.   Matteus Mcnelly A 10/05/2012, 4:04 PM

## 2012-10-05 NOTE — Progress Notes (Signed)
[facility] LCSW Group Therapy   Type of Therapy:  Discharge Planning  Participation Level:  Active  Participation Quality:  Attentive  Affect:  Appropriate  Cognitive:  Appropriate  Insight:  Improving  Engagement in Therapy:  Improving  Modes of Intervention:  Clarification, Problem-solving, Role-play and Support  Summary of Progress/Problems:  Pt denied both suicidal ideation and homicidal ideation.  On a scale of 1 to 10 with ten being the most ever experienced, the patient rates depression at a 1-3 and anxiety at a 3. Patient reports she is willing to return to [facility] and share about her relapse.     [name] 10/05/2012 6:48 PM

## 2012-10-05 NOTE — Progress Notes (Signed)
Psychoeducational Group Note  Date:  10/05/2012 Time:  2000  Group Topic/Focus: AA Meeting  Participation Level:  Active  Participation Quality:    Affect:    Cognitive:    Insight:    Engagement in Group:    Additional Comments:  Patient did attend AA meeting this evening.  Jenasis Straley N 10/05/2012, 1:38 AM 

## 2012-10-05 NOTE — Progress Notes (Signed)
D: Presents somewhat flat, but does brighten immediately in conversation. Calm and cooperative with assessment. No acute distress noted. States she feels like she continues to improve. States she is eager to get back home to care for her x husband and friend. States her plan is to get back to [employer] and try to stay busy with her caregiving responsibilities. Rates her Depression at a 4 (scale 1-10) and hopelessness at a 0 (scale 1-10). Did c/o generalized pain and requested a prn. Otherwise offered no questions or concerns. Denies SI/HI/AVH and contracts for safety.  A: Safety has been maintained with Q15 minute observation. Support and encouragement provided. POC and medications for the shift reviewed and understanding verbalized. Medications provided per MD order. PRN provided as requested.  R: Pt is calm and cooperative. She feels like her mood is improving and she is eager for D/C. She offers no questions or concerns. Will f/u response to prn and continue Q15 minute observation.

## 2012-10-05 NOTE — Progress Notes (Signed)
[facility] LCSW Group Therapy   Type of Therapy:  Group Therapy at 1:15  Participation Level:  Active  Participation Quality:  Appropriate  Affect:  Appropriate  Cognitive:  Alert and Oriented  Insight:  Improving  Engagement in Therapy:  Improving  Modes of Intervention:  Exploration, Problem-solving, Socialization and Support  Summary of Progress/Problems:  Pt shared at multiple points regarding balance in life and was able to process her willingness to increase her efforts in order to maintain sobriety.  [name] [name] was major contributor to group   [name] 10/05/2012 6:48 PM

## 2012-10-05 NOTE — Progress Notes (Signed)
[facility] INPATIENT:  Family/Significant Other Suicide Prevention Education  Suicide Prevention Education:  Education Completed;Melissa Chilton at [phone]  has been identified by the patient as the family member who will aid the patient in the event of a mental health crisis (suicidal ideations/suicide attempt).  With written consent from the patient, the family member/significant other has been provided the following suicide prevention education, prior to the and/or following the discharge of the patient.  The suicide prevention education provided includes the following:  Suicide risk factors  Suicide prevention and interventions  National Suicide Hotline telephone number  [facility] assessment telephone number  [facility] Emergency Assistance 911  [facility] and/or Residential Mobile Crisis Unit telephone number  Request made of family/significant other to:  Remove weapons (e.g., guns, rifles, knives), all items previously/currently identified as safety concern.    Remove drugs/medications (over-the-counter, prescriptions, illicit drugs), all items previously/currently identified as a safety concern.  The family member/significant other verbalizes understanding of the suicide prevention education information provided.  The family member/significant other agrees to remove the items of safety concern listed above.  [name] [name] 10/05/2012, 7:02 PM
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
## 2012-10-06 MED ORDER — ALBUTEROL SULFATE HFA 108 (90 BASE) MCG/ACT IN AERS: 2.0000 | INHALATION_SPRAY | RESPIRATORY_TRACT | Status: DC | PRN

## 2012-10-06 MED ORDER — LISINOPRIL 10 MG PO TABS: 10.0000 mg | ORAL_TABLET | Freq: Every day | ORAL | Status: DC

## 2012-10-06 MED ORDER — VILAZODONE HCL 20 MG PO TABS: 30.0000 mg | ORAL_TABLET | Freq: Every day | ORAL | Status: DC

## 2012-10-06 NOTE — Progress Notes (Signed)
D) Pt is being discharged to home accompanied by her daughter. Affect and mood are appropriate. Denies SI and HI.   A) Pt given support, reassurance and praise. All medications and follow up plans explained to Pt. Pt given smoking information to quit smoking and how to obtain free patches. All belongings returned R) Pt states that she is going to follow up outpatient. Denies SI and HI.

## 2012-10-06 NOTE — Progress Notes (Signed)
[facility] Adult Case Management Discharge Plan :  Will you be returning to the same living situation after discharge: Yes,   At discharge, do you have transportation home?:Yes,   Do you have the ability to pay for your medications:Yes,    Release of information consent forms completed and in the chart;  Patient's signature needed at discharge.  Patient to Follow up at: Follow-up Information    Follow up with [facility]. On 10/09/2012. (Return on Monday 1.13.14 for Intensive Outpatient group from 11:30 - 2:30PM)    Contact information:   [address] [place] [address]          Patient denies SI/HI:   Yes,      Safety Planning and Suicide Prevention discussed:  Yes,  with patient and with patient's daughter  [name] 10/06/2012, 11:56 AM

## 2012-10-06 NOTE — [facility] Suicide Risk Assessment (Signed)
Suicide Risk Assessment  Discharge Assessment     Demographic Factors:  Caucasian  Mental Status Per Nursing Assessment::   On Admission:  Self-harm thoughts;Self-harm behaviors  Current Mental Status by Physician: In full contact with reality. Fully detox. Mood euthymic, affect appropriate, no suicidal ideas, plans or intent. She is committed to abstinence. Will resume her recovery program  Loss Factors: Loss of significant relationship  Historical Factors: NA  Risk Reduction Factors:   Sense of responsibility to family, Employed, Living with another person, especially a relative, Positive social support and Positive therapeutic relationship  Continued Clinical Symptoms:  Depression:   Comorbid alcohol abuse/dependence Alcohol/Substance Abuse/Dependencies  Cognitive Features That Contribute To Risk: None identified     Suicide Risk:  Minimal: No identifiable suicidal ideation.  Patients presenting with no risk factors but with morbid ruminations; may be classified as minimal risk based on the severity of the depressive symptoms  Discharge Diagnoses:   AXIS I:  Alcohol Dependence, Major Depression AXIS II:  Deferred AXIS III:   Past Medical History  Diagnosis Date  . Respiratory failure with hypoxia   . Pneumonia   . Altered mental status     secondary to ethyl alcohol withdrawal  . Systemic inflammatory response syndrome   . Tobacco abuse   . Diastolic congestive heart failure   . Depression   . HTN (hypertension)   . ETOH abuse   . Suicidal ideation   . COPD (chronic obstructive pulmonary disease)    AXIS IV:  problems with primary support group AXIS V:  61-70 mild symptoms  Plan Of Care/Follow-up recommendations:  Activity:  as tolerated Diet:  regular Continue follow up at [facility], AA/Sponsor Is patient on multiple antipsychotic therapies at discharge:  No   Has Patient had three or more failed trials of antipsychotic monotherapy by history:   No  Recommended Plan for Multiple Antipsychotic Therapies: N/A   Deann Mclaine A 10/06/2012, 12:37 PM

## 2012-10-06 NOTE — Progress Notes (Signed)
Psychoeducational Group Note  Date:  10/06/2012 Time:  11:00am  Group Topic/Focus:  Healthy Communication:   The focus of this group is to discuss communication, barriers to communication, as well as healthy ways to communicate with others.  Participation Level:  Active  Participation Quality:  Appropriate  Affect:  Appropriate  Cognitive:  Appropriate  Insight:  Engaged  Engagement in Group:  Engaged  Additional Comments:  participated  Nile Dear 10/06/2012, 11:03 AM

## 2012-10-06 NOTE — Discharge Summary (Signed)
Physician Discharge Summary Note  Patient:  [name] [name] is an 62 y.o., female MRN:  [id number] DOB:  1951-05-16 Patient phone:  [phone] (home)  Patient address:   [address] [place] [address],   Date of Admission:  10/02/2012  Date of Discharge: 10/07/11  Reason for Admission:  Alcohol dependence requesting detoxification.  Discharge Diagnoses: Active Problems:  * No active hospital problems. *   Review of Systems  Constitutional: Negative.   HENT: Negative.   Eyes: Negative.   Respiratory: Positive for hemoptysis.   Cardiovascular: Negative.   Gastrointestinal: Negative.   Genitourinary: Negative.   Musculoskeletal: Negative.   Skin: Negative.   Neurological: Negative.   Endo/Heme/Allergies: Negative.   Psychiatric/Behavioral: Positive for depression (Stabilized with medication prior to discharge.) and substance abuse. Negative for suicidal ideas, hallucinations and memory loss. The patient is not nervous/anxious and does not have insomnia.    Axis Diagnosis:   AXIS I:  Alcohol dependence, Major depressive disorder. AXIS II:  Deferred AXIS III:   Past Medical History  Diagnosis Date  . Respiratory failure with hypoxia   . Pneumonia   . Altered mental status     secondary to ethyl alcohol withdrawal  . Systemic inflammatory response syndrome   . Tobacco abuse   . Diastolic congestive heart failure   . Depression   . HTN (hypertension)   . ETOH abuse   . Suicidal ideation   . COPD (chronic obstructive pulmonary disease)    AXIS IV:  other psychosocial or environmental problems AXIS V:  63  Level of Care:  OP  Hospital Course:  Was here in May with depression and alcoholism. Went home after discharge. She was able to abstain for couple of months. Went to [facility] for 30 days after she relapsed. Was going to meetings. Staid sober six months. Met someone, did not work out. He is an alcoholic in recovery. When things did not work out she said  " what the heck," and relapsed. Was drinking was depressed was angry with herself started stabbing her arm. 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  While a patient in this hospital, [name] [name] received medication management for her major depressive symptoms. She was maintained on Vilazodone 20 mg for her depression. This was her routine medication for depression that she was taking prior to her admission here. She also was enrolled in group counseling sessions and activities to learn coping skills that should help her cope better with her symptoms and maintain a much longer sobriety after discharge. She also received medication management and monitoring for her other medical issues and concerns. She tolerated her treatment regimen without any significant adverse effects and or reactions presented.   Patient did respond positively to her treatment regimen. This is evidenced by her daily reports of improved mood, reduction of symptoms and presentation of good affect. However, the concern remains her substance abuse issues. Patient's family did request for patient to go to a long term treatment facility for much longer stay and treatment. They voiced their concern over their mother's substance abuse issues as she continued to down play the seriousness of it all. But [name] [name] already made a decision to continue substance abuse treatment at the [facility] where she was enrolled prior to her admission here. She also stated that she does attend AA/NA meetings being offered in her community on weekly  basis. She added that she does have a sponsor as well. Patient blamed her recent relapse on a new man that she met and who gave her attention. And for her, one thing led to the other. In an attempt to please his new found love, led to her drinking alcohol again.    She attended treatment team this am and met with her treatment team members. Her  reason for admission, symptoms, response to treatment and discharge plans discussed with patient. [name] [name] endorsed that her symptoms has stabilized and that she she is ready for discharge. It was agreed upon that patient will follow-up care and resume substance abuse treatment at the [facility] in [place], [place] on 10/09/12.   Upon discharge, [name] [name] adamantly denies any suicidal, homicidal ideations, auditory, visual hallucinations and or delusional thinking. She was provided with 14 days worth supply samples of her [facility] discharge medications. She left [facility] with all personal belongings via personal arranged transport in no apparent distress.  Consults:  None  Significant Diagnostic Studies:  labs: CBC with diff, CMP, Toxicology, UDS  Discharge Vitals:   Blood pressure 151/94, pulse 89, temperature 96.8 F (36 C), temperature source Oral, resp. rate 20, height 5\' 2"  (1.575 m), weight 82.555 kg (182 lb). Body mass index is 33.29 kg/(m^2). Lab Results:   No results found for this or any previous visit (from the past 72 hour(s)).  Physical Findings: AIMS: Facial and Oral Movements Muscles of Facial Expression: None, normal Lips and Perioral Area: None, normal Jaw: None, normal Tongue: None, normal,Extremity Movements Upper (arms, wrists, hands, fingers): None, normal Lower (legs, knees, ankles, toes): None, normal, Trunk Movements Neck, shoulders, hips: None, normal, Overall Severity Severity of abnormal movements (highest score from questions above): None, normal Incapacitation due to abnormal movements: None, normal Patient's awareness of abnormal movements (rate only patient's report): No Awareness, Dental Status Current problems with teeth and/or dentures?: No Does patient usually wear dentures?: No  CIWA:  CIWA-Ar Total: 0  COWS:  COWS Total Score: 0   Psychiatric Specialty Exam: See Psychiatric Specialty Exam and Suicide Risk Assessment completed by Attending Physician  prior to discharge.  Discharge destination:  Home  Is patient on multiple antipsychotic therapies at discharge:  No   Has Patient had three or more failed trials of antipsychotic monotherapy by history:  No  Recommended Plan for Multiple Antipsychotic Therapies: NA     Medication List     As of 10/09/2012  2:02 PM    STOP taking these medications         citalopram 20 MG tablet   Commonly known as: CELEXA      TAKE these medications      Indication    albuterol 108 (90 BASE) MCG/ACT inhaler   Commonly known as: PROVENTIL HFA;VENTOLIN HFA   Inhale 2 puffs into the lungs every 4 (four) hours as needed. : For Wheezing/shortness of breath       lisinopril 10 MG tablet   Commonly known as: PRINIVIL,ZESTRIL   Take 1 tablet (10 mg total) by mouth daily. For control of high blood pressure.       Vilazodone HCl 20 MG Tabs   Take 1.5 tablets (30 mg total) by mouth daily. For depression          Follow-up Information    Follow up with [facility]. On 10/09/2012. (Return on Monday 1.13.14 for Intensive Outpatient group from 11:30 - 2:30PM)    Contact information:   [address] [place] [address]          Follow-up recommendations:  Activity:  as tolerated Other:  Keep all scheduled follow-up appointments as recommended.    Comments:  Take all your medications as prescribed by your mental healthcare provider. Report any adverse effects and or reactions from your medicines to your outpatient provider promptly. Patient is instructed and cautioned to not engage in alcohol and or illegal drug use while on prescription medicines. In the event of worsening symptoms, patient is instructed to call the crisis hotline, 911 and or go to the nearest ED for appropriate evaluation and treatment of symptoms. Follow-up with your primary care provider for your other medical issues, concerns and or health care needs.     Total Discharge Time:  Greater than 30  minutes.  SignedArmandina Stammer I 10/09/2012, 2:02 PM

## 2012-10-10 NOTE — Progress Notes (Signed)
Patient Discharge Instructions:  After Visit Summary (AVS):   Faxed to:  10/10/12 Psychiatric Admission Assessment Note:   Faxed to:  10/10/12 Suicide Risk Assessment - Discharge Assessment:   Faxed to:  10/10/12 Faxed/Sent to the Next Level Care provider:  10/10/12 Faxed to [facility] @ [phone]  Jerelene Redden, 10/10/2012, 3:46 PM

## 2012-10-14 ENCOUNTER — Emergency Department (HOSPITAL_COMMUNITY)
Admission: EM | Admit: 2012-10-14 | Discharge: 2012-10-14 | Disposition: A | Discharge status: Home / Self Care | Payer: Medicaid Other | Attending: Emergency Medicine | Admitting: Emergency Medicine

## 2012-10-14 ENCOUNTER — Encounter (HOSPITAL_COMMUNITY): Payer: Self-pay | Admitting: Emergency Medicine

## 2012-10-14 ENCOUNTER — Emergency Department (HOSPITAL_COMMUNITY): Payer: Medicaid Other

## 2012-10-14 DIAGNOSIS — F17200 Nicotine dependence, unspecified, uncomplicated: Secondary | ICD-10-CM | POA: Insufficient documentation

## 2012-10-14 DIAGNOSIS — Z8701 Personal history of pneumonia (recurrent): Secondary | ICD-10-CM | POA: Insufficient documentation

## 2012-10-14 DIAGNOSIS — W108XXA Fall (on) (from) other stairs and steps, initial encounter: Secondary | ICD-10-CM | POA: Insufficient documentation

## 2012-10-14 DIAGNOSIS — J4489 Other specified chronic obstructive pulmonary disease: Secondary | ICD-10-CM | POA: Insufficient documentation

## 2012-10-14 DIAGNOSIS — S4990XA Unspecified injury of shoulder and upper arm, unspecified arm, initial encounter: Secondary | ICD-10-CM

## 2012-10-14 DIAGNOSIS — F3289 Other specified depressive episodes: Secondary | ICD-10-CM | POA: Insufficient documentation

## 2012-10-14 DIAGNOSIS — Z8709 Personal history of other diseases of the respiratory system: Secondary | ICD-10-CM | POA: Insufficient documentation

## 2012-10-14 DIAGNOSIS — S99919A Unspecified injury of unspecified ankle, initial encounter: Secondary | ICD-10-CM | POA: Insufficient documentation

## 2012-10-14 DIAGNOSIS — S8990XA Unspecified injury of unspecified lower leg, initial encounter: Secondary | ICD-10-CM | POA: Insufficient documentation

## 2012-10-14 DIAGNOSIS — S4980XA Other specified injuries of shoulder and upper arm, unspecified arm, initial encounter: Secondary | ICD-10-CM | POA: Insufficient documentation

## 2012-10-14 DIAGNOSIS — F329 Major depressive disorder, single episode, unspecified: Secondary | ICD-10-CM | POA: Insufficient documentation

## 2012-10-14 DIAGNOSIS — J449 Chronic obstructive pulmonary disease, unspecified: Secondary | ICD-10-CM | POA: Insufficient documentation

## 2012-10-14 DIAGNOSIS — Y9389 Activity, other specified: Secondary | ICD-10-CM | POA: Insufficient documentation

## 2012-10-14 DIAGNOSIS — I10 Essential (primary) hypertension: Secondary | ICD-10-CM | POA: Insufficient documentation

## 2012-10-14 DIAGNOSIS — Z79899 Other long term (current) drug therapy: Secondary | ICD-10-CM | POA: Insufficient documentation

## 2012-10-14 DIAGNOSIS — F1010 Alcohol abuse, uncomplicated: Secondary | ICD-10-CM | POA: Insufficient documentation

## 2012-10-14 DIAGNOSIS — X500XXA Overexertion from strenuous movement or load, initial encounter: Secondary | ICD-10-CM | POA: Insufficient documentation

## 2012-10-14 DIAGNOSIS — S46909A Unspecified injury of unspecified muscle, fascia and tendon at shoulder and upper arm level, unspecified arm, initial encounter: Secondary | ICD-10-CM | POA: Insufficient documentation

## 2012-10-14 DIAGNOSIS — W19XXXA Unspecified fall, initial encounter: Secondary | ICD-10-CM

## 2012-10-14 DIAGNOSIS — Y929 Unspecified place or not applicable: Secondary | ICD-10-CM | POA: Insufficient documentation

## 2012-10-14 DIAGNOSIS — I5030 Unspecified diastolic (congestive) heart failure: Secondary | ICD-10-CM | POA: Insufficient documentation

## 2012-10-14 MED ORDER — TRAMADOL HCL 50 MG PO TABS: 50.0000 mg | ORAL_TABLET | Freq: Four times a day (QID) | ORAL | Status: DC | PRN

## 2012-10-14 NOTE — ED Provider Notes (Signed)
Medical screening examination/treatment/procedure(s) were performed by non-physician practitioner and as supervising physician I was immediately available for consultation/collaboration.  Lumi Winslett R. Christin Moline, MD 10/14/12 2318 

## 2012-10-14 NOTE — ED Notes (Signed)
Patient reports that she fell down some stairs yesterday and today her left foot is hurting. The patient also states that she twisted her right shoulder

## 2012-10-14 NOTE — ED Provider Notes (Signed)
History  Scribed for Marlon Pel, PA-C/ Juliet Rude. Pickering, MD, the patient was seen in room WTR7/WTR7. This chart was scribed by Candelaria Stagers. The patient's care started at 7:53 PM   CSN: [id number]  Arrival date & time 10/14/12  1727   First MD Initiated Contact with Patient 10/14/12 1928      Chief Complaint  Patient presents with  . Foot Pain  . Shoulder Pain     The history is provided by the patient. No language interpreter was used.   [name] [name] is a 62 y.o. female who presents to the Emergency Department complaining of left foot and right shoulder pain after missing a step and falling down two stairs yesterday.  Pt states she grabbed the railing to catch herself and twisted the arm backwards.  She denies hitting her head or LOC.  Raising the arm makes the pain worse.  Pt has injured the right shoulder previously.  She has no neck pain.  Pt reports that she is unable to bear weight on the left foot.  Nothing seems to make the sx better or worse.  Pt has h/o alcoholism.   Past Medical History  Diagnosis Date  . Respiratory failure with hypoxia   . Pneumonia   . Altered mental status     secondary to ethyl alcohol withdrawal  . Systemic inflammatory response syndrome   . Tobacco abuse   . Diastolic congestive heart failure   . Depression   . HTN (hypertension)   . ETOH abuse   . Suicidal ideation   . COPD (chronic obstructive pulmonary disease)     Past Surgical History  Procedure Date  . Cesarean section   . Tubal ligation   . Cholecystectomy   . Cystectomy     both ears  . Total abdominal hysterectomy     Family History  Problem Relation Age of Onset  . Heart disease Father   . Lung cancer Father   . Lung cancer Mother   . Diabetes Father   . Asthma Father     History  Substance Use Topics  . Smoking status: Current Every Day Smoker -- 1.0 packs/day for 40 years    Types: Cigarettes  . Smokeless tobacco: Never Used     Comment: relapsed  smoking june 2012  . Alcohol Use: Yes     Comment: drinks daily    OB History    Grav Para Term Preterm Abortions TAB SAB Ect Mult Living                  Review of Systems  Constitutional: Negative for fever and chills.  Respiratory: Negative for shortness of breath.   Gastrointestinal: Negative for nausea and vomiting.  Musculoskeletal: Positive for arthralgias (right shoulder pain, left foot pain).  Skin: Negative for wound.  Neurological: Negative for weakness and headaches.  All other systems reviewed and are negative.    Allergies  Codeine  Home Medications   Current Outpatient Rx  Name  Route  Sig  Dispense  Refill  . ALBUTEROL SULFATE HFA 108 (90 BASE) MCG/ACT IN AERS   Inhalation   Inhale 2 puffs into the lungs every 4 (four) hours as needed. : For Wheezing/shortness of breath         . OMEGA-3 FATTY ACIDS 1000 MG PO CAPS   Oral   Take 1 g by mouth daily.         Marland Kitchen LISINOPRIL 10 MG PO TABS   Oral  Take 1 tablet (10 mg total) by mouth daily. For control of high blood pressure.   30 tablet   0   . VILAZODONE HCL 20 MG PO TABS   Oral   Take 1.5 tablets (30 mg total) by mouth daily. For depression   45 tablet   0   . TRAMADOL HCL 50 MG PO TABS   Oral   Take 1 tablet (50 mg total) by mouth every 6 (six) hours as needed for pain.   15 tablet   0     BP 113/88  Pulse 89  Temp 98.6 F (37 C) (Oral)  Resp 18  SpO2 100%  Physical Exam  Nursing note and vitals reviewed. Constitutional: She is oriented to person, place, and time. She appears well-developed and well-nourished. No distress.  HENT:  Head: Normocephalic and atraumatic.  Eyes: EOM are normal.  Neck: Neck supple. No tracheal deviation present.  Cardiovascular: Normal rate.   Pulmonary/Chest: Effort normal. No respiratory distress.  Musculoskeletal: Normal range of motion.       Right shoulder with decreased ROM due to pain.  Good grip strength.  Neurovascularly intact to all five  fingers.  No deformity of the shoulder joint.   Lateral tenderness to the left foot.  Pt has decreased ROM due to pain of ankle joint.  No deformity noted.  Neurovascularly intact to all five toes.    Neurological: She is alert and oriented to person, place, and time.  Skin: Skin is warm and dry.  Psychiatric: She has a normal mood and affect. Her behavior is normal.    ED Course  Procedures   DIAGNOSTIC STUDIES: Oxygen Saturation is 100% on room air, normal by my interpretation.    COORDINATION OF CARE:  6:46PM Ordered: DG Foot Complete Left; DG Shoulder Right 7:57 PM Will order sling for the shoulder and a boot for the left foot.  Advised pt to follow up with an orthopedist.  Advised pt to take ibuprofen and apply ice.  Will prescribe pain medication that pt will take at night.  Pt understands and agrees.  Pt specifically asked for Dr. Teressa Senter for orthopedist referral.      Labs Reviewed - No data to display Dg Shoulder Right  10/14/2012  *RADIOLOGY REPORT*  Clinical Data: Fall, pain  RIGHT SHOULDER - 2+ VIEW  Comparison: None.  Findings: No glenohumeral fracture or dislocation.  AC joint narrowing.  IMPRESSION: No acute findings.   Original Report Authenticated By: Davonna Belling, M.D.    Dg Foot Complete Left  10/14/2012  *RADIOLOGY REPORT*  Clinical Data: Larey Seat, pain  LEFT FOOT - COMPLETE 3+ VIEW  Comparison:  None.  Findings:  There is no evidence of fracture or dislocation.  There is no evidence of arthropathy or other focal bone abnormality. Slight dorsal soft tissue swelling. Small plantar and Achilles spurs.  IMPRESSION: Negative for fracture.   Original Report Authenticated By: Davonna Belling, M.D.      1. Fall   2. Shoulder injury   3. Ankle injury       MDM  Pt has been advised of the symptoms that warrant their return to the ED. Patient has voiced understanding and has agreed to follow-up with the PCP or specialist.   I personally performed the services described in this  documentation, which was scribed in my presence. The recorded information has been reviewed and is accurate.      Dorthula Matas, PA 10/14/12 2226

## 2012-10-19 NOTE — Discharge Summary (Signed)
Agree with assessment and plan Raynie Steinhaus A. Ziggy Reveles, M.D. 

## 2012-12-01 ENCOUNTER — Inpatient Hospital Stay (HOSPITAL_COMMUNITY)
Admission: EM | Admit: 2012-12-01 | Discharge: 2012-12-07 | DRG: 092 | Disposition: A | Discharge status: Home / Self Care | Payer: Medicaid Other | Attending: Internal Medicine | Admitting: Internal Medicine

## 2012-12-01 ENCOUNTER — Encounter (HOSPITAL_COMMUNITY): Payer: Self-pay | Admitting: Emergency Medicine

## 2012-12-01 ENCOUNTER — Emergency Department (HOSPITAL_COMMUNITY): Payer: Medicaid Other

## 2012-12-01 DIAGNOSIS — J9589 Other postprocedural complications and disorders of respiratory system, not elsewhere classified: Secondary | ICD-10-CM

## 2012-12-01 DIAGNOSIS — N390 Urinary tract infection, site not specified: Secondary | ICD-10-CM | POA: Diagnosis present

## 2012-12-01 DIAGNOSIS — R4182 Altered mental status, unspecified: Secondary | ICD-10-CM

## 2012-12-01 DIAGNOSIS — B961 Klebsiella pneumoniae [K. pneumoniae] as the cause of diseases classified elsewhere: Secondary | ICD-10-CM | POA: Diagnosis present

## 2012-12-01 DIAGNOSIS — R5383 Other fatigue: Secondary | ICD-10-CM

## 2012-12-01 DIAGNOSIS — F17200 Nicotine dependence, unspecified, uncomplicated: Secondary | ICD-10-CM | POA: Diagnosis present

## 2012-12-01 DIAGNOSIS — G929 Unspecified toxic encephalopathy: Principal | ICD-10-CM | POA: Diagnosis present

## 2012-12-01 DIAGNOSIS — E872 Acidosis, unspecified: Secondary | ICD-10-CM | POA: Diagnosis present

## 2012-12-01 DIAGNOSIS — J80 Acute respiratory distress syndrome: Secondary | ICD-10-CM

## 2012-12-01 DIAGNOSIS — T43205A Adverse effect of unspecified antidepressants, initial encounter: Secondary | ICD-10-CM | POA: Diagnosis present

## 2012-12-01 DIAGNOSIS — Z22322 Carrier or suspected carrier of Methicillin resistant Staphylococcus aureus: Secondary | ICD-10-CM

## 2012-12-01 DIAGNOSIS — F489 Nonpsychotic mental disorder, unspecified: Secondary | ICD-10-CM | POA: Diagnosis present

## 2012-12-01 DIAGNOSIS — I951 Orthostatic hypotension: Secondary | ICD-10-CM | POA: Diagnosis present

## 2012-12-01 DIAGNOSIS — J9600 Acute respiratory failure, unspecified whether with hypoxia or hypercapnia: Secondary | ICD-10-CM

## 2012-12-01 DIAGNOSIS — Z885 Allergy status to narcotic agent status: Secondary | ICD-10-CM

## 2012-12-01 DIAGNOSIS — I509 Heart failure, unspecified: Secondary | ICD-10-CM | POA: Diagnosis present

## 2012-12-01 DIAGNOSIS — J449 Chronic obstructive pulmonary disease, unspecified: Secondary | ICD-10-CM | POA: Diagnosis present

## 2012-12-01 DIAGNOSIS — F1920 Other psychoactive substance dependence, uncomplicated: Secondary | ICD-10-CM | POA: Diagnosis present

## 2012-12-01 DIAGNOSIS — F1020 Alcohol dependence, uncomplicated: Secondary | ICD-10-CM

## 2012-12-01 DIAGNOSIS — E869 Volume depletion, unspecified: Secondary | ICD-10-CM | POA: Diagnosis present

## 2012-12-01 DIAGNOSIS — R5381 Other malaise: Secondary | ICD-10-CM

## 2012-12-01 DIAGNOSIS — R946 Abnormal results of thyroid function studies: Secondary | ICD-10-CM | POA: Diagnosis present

## 2012-12-01 DIAGNOSIS — T39094A Poisoning by salicylates, undetermined, initial encounter: Secondary | ICD-10-CM | POA: Diagnosis present

## 2012-12-01 DIAGNOSIS — T503X5A Adverse effect of electrolytic, caloric and water-balance agents, initial encounter: Secondary | ICD-10-CM | POA: Diagnosis not present

## 2012-12-01 DIAGNOSIS — R197 Diarrhea, unspecified: Secondary | ICD-10-CM | POA: Diagnosis not present

## 2012-12-01 DIAGNOSIS — Z880 Allergy status to penicillin: Secondary | ICD-10-CM

## 2012-12-01 DIAGNOSIS — R209 Unspecified disturbances of skin sensation: Secondary | ICD-10-CM

## 2012-12-01 DIAGNOSIS — J4489 Other specified chronic obstructive pulmonary disease: Secondary | ICD-10-CM | POA: Diagnosis present

## 2012-12-01 DIAGNOSIS — E876 Hypokalemia: Secondary | ICD-10-CM | POA: Diagnosis present

## 2012-12-01 DIAGNOSIS — G92 Toxic encephalopathy: Principal | ICD-10-CM | POA: Diagnosis present

## 2012-12-01 DIAGNOSIS — T39091A Poisoning by salicylates, accidental (unintentional), initial encounter: Secondary | ICD-10-CM

## 2012-12-01 DIAGNOSIS — Y9210 Unspecified residential institution as the place of occurrence of the external cause: Secondary | ICD-10-CM | POA: Diagnosis not present

## 2012-12-01 DIAGNOSIS — D649 Anemia, unspecified: Secondary | ICD-10-CM

## 2012-12-01 DIAGNOSIS — I5032 Chronic diastolic (congestive) heart failure: Secondary | ICD-10-CM | POA: Diagnosis present

## 2012-12-01 DIAGNOSIS — F1021 Alcohol dependence, in remission: Secondary | ICD-10-CM | POA: Diagnosis present

## 2012-12-01 DIAGNOSIS — E2749 Other adrenocortical insufficiency: Secondary | ICD-10-CM | POA: Diagnosis present

## 2012-12-01 DIAGNOSIS — I10 Essential (primary) hypertension: Secondary | ICD-10-CM | POA: Diagnosis present

## 2012-12-01 DIAGNOSIS — F329 Major depressive disorder, single episode, unspecified: Secondary | ICD-10-CM

## 2012-12-01 DIAGNOSIS — Y92009 Unspecified place in unspecified non-institutional (private) residence as the place of occurrence of the external cause: Secondary | ICD-10-CM

## 2012-12-01 DIAGNOSIS — T507X5A Adverse effect of analeptics and opioid receptor antagonists, initial encounter: Secondary | ICD-10-CM | POA: Diagnosis present

## 2012-12-01 HISTORY — DX: Unspecified osteoarthritis, unspecified site: M19.90

## 2012-12-01 HISTORY — DX: Anxiety disorder, unspecified: F41.9

## 2012-12-01 HISTORY — DX: Anemia, unspecified: D64.9

## 2012-12-01 HISTORY — DX: Headache: R51

## 2012-12-01 HISTORY — DX: Unspecified asthma, uncomplicated: J45.909

## 2012-12-01 LAB — LACTIC ACID, PLASMA
Lactic Acid, Venous: 0.7 mmol/L (ref 0.5–2.2)
Lactic Acid, Venous: 0.6 mmol/L (ref 0.5–2.2)

## 2012-12-01 LAB — CBC WITH DIFFERENTIAL/PLATELET
Basophils Absolute: 0 10*3/uL (ref 0.0–0.1)
Basophils Relative: 0 % (ref 0–1)
HCT: 36 % (ref 36.0–46.0)
MCHC: 33.1 g/dL (ref 30.0–36.0)
Monocytes Absolute: 0.5 10*3/uL (ref 0.1–1.0)
Neutro Abs: 4.4 10*3/uL (ref 1.7–7.7)
Neutrophils Relative %: 61 % (ref 43–77)
RDW: 15.9 % — ABNORMAL HIGH (ref 11.5–15.5)

## 2012-12-01 LAB — COMPREHENSIVE METABOLIC PANEL
AST: 41 U/L — ABNORMAL HIGH (ref 0–37)
Albumin: 3.2 g/dL — ABNORMAL LOW (ref 3.5–5.2)
Calcium: 8.3 mg/dL — ABNORMAL LOW (ref 8.4–10.5)
Chloride: 111 mEq/L (ref 96–112)
Creatinine, Ser: 0.74 mg/dL (ref 0.50–1.10)
Total Bilirubin: 0.1 mg/dL — ABNORMAL LOW (ref 0.3–1.2)
Total Protein: 7.1 g/dL (ref 6.0–8.3)

## 2012-12-01 LAB — RAPID URINE DRUG SCREEN, HOSP PERFORMED
Opiates: NOT DETECTED
Tetrahydrocannabinol: NOT DETECTED

## 2012-12-01 LAB — BASIC METABOLIC PANEL
BUN: 20 mg/dL (ref 6–23)
Chloride: 114 mEq/L — ABNORMAL HIGH (ref 96–112)
Creatinine, Ser: 0.57 mg/dL (ref 0.50–1.10)
Glucose, Bld: 106 mg/dL — ABNORMAL HIGH (ref 70–99)
Potassium: 3.1 mEq/L — ABNORMAL LOW (ref 3.5–5.1)

## 2012-12-01 LAB — POCT I-STAT 3, ART BLOOD GAS (G3+)
Acid-base deficit: 5 mmol/L — ABNORMAL HIGH (ref 0.0–2.0)
O2 Saturation: 94 %
Patient temperature: 98.6

## 2012-12-01 LAB — URINALYSIS, ROUTINE W REFLEX MICROSCOPIC
Glucose, UA: NEGATIVE mg/dL
Leukocytes, UA: NEGATIVE
Nitrite: NEGATIVE
Protein, ur: NEGATIVE mg/dL
Specific Gravity, Urine: 1.03 (ref 1.005–1.030)
Urobilinogen, UA: 0.2 mg/dL (ref 0.0–1.0)
pH: 5 (ref 5.0–8.0)

## 2012-12-01 LAB — PROCALCITONIN: Procalcitonin: <0.1 ng/mL

## 2012-12-01 LAB — AMMONIA: Ammonia: 34 umol/L (ref 11–60)

## 2012-12-01 LAB — TROPONIN I: Troponin I: <0.3 ng/mL (ref <0.30)

## 2012-12-01 LAB — ETHANOL: Alcohol, Ethyl (B): <11 mg/dL (ref 0–11)

## 2012-12-01 LAB — GLUCOSE, CAPILLARY: Glucose-Capillary: 102 mg/dL — ABNORMAL HIGH (ref 70–99)

## 2012-12-01 MED ORDER — SODIUM CHLORIDE 0.9 % IV BOLUS (SEPSIS)
500.0000 mL | Freq: Once | INTRAVENOUS | Status: AC
  Administered 2012-12-01: 500 mL via INTRAVENOUS

## 2012-12-01 MED ORDER — HEPARIN SODIUM (PORCINE) 5000 UNIT/ML IJ SOLN
5000.0000 [IU] | Freq: Three times a day (TID) | INTRAMUSCULAR | Status: DC
  Administered 2012-12-01 – 2012-12-07 (×17): 5000 [IU] via SUBCUTANEOUS
  Filled 2012-12-01 (×23): qty 1

## 2012-12-01 MED ORDER — DOPAMINE-DEXTROSE 3.2-5 MG/ML-% IV SOLN
2.0000 ug/kg/min | INTRAVENOUS | Status: DC
  Administered 2012-12-01: 10 ug/kg/min via INTRAVENOUS
  Administered 2012-12-02: 6 ug/kg/min via INTRAVENOUS
  Filled 2012-12-01: qty 250

## 2012-12-01 MED ORDER — SODIUM CHLORIDE 0.9 % IV SOLN: INTRAVENOUS | Status: DC

## 2012-12-01 MED ORDER — SODIUM CHLORIDE 0.9 % IJ SOLN
3.0000 mL | Freq: Two times a day (BID) | INTRAMUSCULAR | Status: DC
  Administered 2012-12-02 – 2012-12-03 (×2): 3 mL via INTRAVENOUS

## 2012-12-01 MED ORDER — SODIUM CHLORIDE 0.9 % IV SOLN
Freq: Once | INTRAVENOUS | Status: AC
  Administered 2012-12-01: 17:00:00 via INTRAVENOUS

## 2012-12-01 MED ORDER — DOPAMINE-DEXTROSE 3.2-5 MG/ML-% IV SOLN
INTRAVENOUS | Status: AC
  Filled 2012-12-01: qty 250

## 2012-12-01 MED ORDER — CHLORHEXIDINE GLUCONATE CLOTH 2 % EX PADS
6.0000 | MEDICATED_PAD | Freq: Every day | CUTANEOUS | Status: AC
  Administered 2012-12-02 – 2012-12-06 (×5): 6 via TOPICAL

## 2012-12-01 MED ORDER — ONDANSETRON HCL 4 MG/2ML IJ SOLN
4.0000 mg | Freq: Four times a day (QID) | INTRAMUSCULAR | Status: DC | PRN
  Administered 2012-12-02 – 2012-12-03 (×2): 4 mg via INTRAVENOUS
  Filled 2012-12-01 (×3): qty 2

## 2012-12-01 MED ORDER — NALOXONE HCL 0.4 MG/ML IJ SOLN
0.4000 mg | Freq: Once | INTRAMUSCULAR | Status: AC
  Administered 2012-12-01: 0.4 mg via INTRAVENOUS
  Filled 2012-12-01: qty 1

## 2012-12-01 MED ORDER — SODIUM CHLORIDE 0.9 % IV SOLN
500.0000 mg | Freq: Four times a day (QID) | INTRAVENOUS | Status: DC
  Filled 2012-12-01: qty 500

## 2012-12-01 MED ORDER — ONDANSETRON HCL 4 MG/2ML IJ SOLN: 4.0000 mg | Freq: Three times a day (TID) | INTRAMUSCULAR | Status: DC | PRN

## 2012-12-01 MED ORDER — SODIUM CHLORIDE 0.9 % IV SOLN
Freq: Once | INTRAVENOUS | Status: AC
  Administered 2012-12-01: 20:00:00 via INTRAVENOUS

## 2012-12-01 MED ORDER — ONDANSETRON HCL 4 MG PO TABS: 4.0000 mg | ORAL_TABLET | Freq: Four times a day (QID) | ORAL | Status: DC | PRN

## 2012-12-01 MED ORDER — POTASSIUM CHLORIDE IN NACL 20-0.9 MEQ/L-% IV SOLN
INTRAVENOUS | Status: DC
  Administered 2012-12-01 (×2): via INTRAVENOUS
  Filled 2012-12-01 (×3): qty 1000

## 2012-12-01 MED ORDER — DEXTROSE 5 % IV SOLN
1.0000 g | INTRAVENOUS | Status: DC
  Administered 2012-12-01: 1 g via INTRAVENOUS
  Filled 2012-12-01: qty 10

## 2012-12-01 MED ORDER — FOLIC ACID 5 MG/ML IJ SOLN
1.0000 mg | Freq: Every day | INTRAMUSCULAR | Status: DC
  Administered 2012-12-01 – 2012-12-02 (×2): 1 mg via INTRAVENOUS
  Filled 2012-12-01 (×2): qty 0.2

## 2012-12-01 MED ORDER — SODIUM CHLORIDE 0.9 % IV BOLUS (SEPSIS)
2000.0000 mL | Freq: Once | INTRAVENOUS | Status: AC
  Administered 2012-12-01: 2000 mL via INTRAVENOUS

## 2012-12-01 MED ORDER — SODIUM CHLORIDE 0.9 % IV BOLUS (SEPSIS)
1000.0000 mL | Freq: Once | INTRAVENOUS | Status: AC
  Administered 2012-12-01: 1000 mL via INTRAVENOUS

## 2012-12-01 MED ORDER — ACETAMINOPHEN 650 MG RE SUPP: 650.0000 mg | Freq: Four times a day (QID) | RECTAL | Status: DC | PRN

## 2012-12-01 MED ORDER — VANCOMYCIN HCL IN DEXTROSE 1-5 GM/200ML-% IV SOLN
1000.0000 mg | Freq: Two times a day (BID) | INTRAVENOUS | Status: DC
  Administered 2012-12-01: 1000 mg via INTRAVENOUS
  Filled 2012-12-01 (×3): qty 200

## 2012-12-01 MED ORDER — THIAMINE HCL 100 MG/ML IJ SOLN
100.0000 mg | Freq: Every day | INTRAMUSCULAR | Status: DC
  Administered 2012-12-01 – 2012-12-03 (×3): 100 mg via INTRAVENOUS
  Filled 2012-12-01 (×3): qty 1

## 2012-12-01 MED ORDER — ALBUTEROL SULFATE HFA 108 (90 BASE) MCG/ACT IN AERS
2.0000 | INHALATION_SPRAY | RESPIRATORY_TRACT | Status: DC | PRN
  Filled 2012-12-01: qty 6.7

## 2012-12-01 MED ORDER — ACETAMINOPHEN 325 MG PO TABS
650.0000 mg | ORAL_TABLET | Freq: Four times a day (QID) | ORAL | Status: DC | PRN
  Administered 2012-12-03 – 2012-12-06 (×3): 650 mg via ORAL
  Filled 2012-12-01 (×3): qty 2

## 2012-12-01 MED ORDER — MUPIROCIN 2 % EX OINT
1.0000 "application " | TOPICAL_OINTMENT | Freq: Two times a day (BID) | CUTANEOUS | Status: AC
  Administered 2012-12-01 – 2012-12-06 (×10): 1 via NASAL
  Filled 2012-12-01 (×2): qty 22

## 2012-12-01 NOTE — ED Notes (Signed)
Family reports pt normally snores.

## 2012-12-01 NOTE — Progress Notes (Signed)
Notified [facility]ink and [facility] about pts sbp 60s .  New orders received. Will continue to monitor. Emilie [place] Liter

## 2012-12-01 NOTE — Progress Notes (Signed)
eLink Physician-Brief Progress Note Patient Name: [name] [name] DOB: 08/20/1951 MRN: [id number]  Date of Service  12/01/2012   HPI/Events of Note  Patient with improved BP but is on pressor support with dopamine at 8 mcg.  Current BP of 98/56 (70).  Remains tachycardiac with HR of 110 present before starting the DA gtt.   eICU Interventions  Plan: 500 cc NS bolus IV for hypotension    Intervention Category Intermediate Interventions: Hypotension - evaluation and management  DETERDING,ELIZABETH 12/01/2012, 10:45 PM

## 2012-12-01 NOTE — H&P (Signed)
[facility] History and Physical  [name] [name] MRN:[id number] DOB: 09/28/1950 DOA: 12/01/2012  Referring physician: Stephen Rancour, MD   PCP: ELKINS,ROBERT W, MD   Chief Complaint: Lethargy  HPI: [name] [name] is a 61 y.o. female with testicle history of alcohol abuse, hypertension and major depression. Patient had recent admission to behavioral health hospital for suicidal attempt with cutting her wrist. Patient presents today to the emergency department because of confusion. The history was obtained primarily from the daughter, she said she had some confusion about 2 weeks ago and workup was done on Dr. Elkins office, they did not call them with the blood work results. She started to have increasing confusion and lethargy since last night, today she can even stay awake. Upon initial evaluation in the emergency department patient is extremely lethargic but she opened her eyes her concentration span is probably about 5 seconds, CT scan of the head was negative, her blood alcohol level is <11, normal ammonia level, ABG showed normal PCO2 with mild acidosis. The rest of the studies including UDS, urinalysis are pending at the time of dictation.  Review of Systems:  Review of systems is unobtainable because of lethargy.  Past Medical History  Diagnosis Date  . Respiratory failure with hypoxia   . Pneumonia   . Altered mental status     secondary to ethyl alcohol withdrawal  . Systemic inflammatory response syndrome   . Tobacco abuse   . Diastolic congestive heart failure   . Depression   . HTN (hypertension)   . ETOH abuse   . Suicidal ideation   . COPD (chronic obstructive pulmonary disease)    Past Surgical History  Procedure Laterality Date  . Cesarean section    . Tubal ligation    . Cholecystectomy    . Cystectomy      both ears  . Total abdominal hysterectomy     Social History:  reports that she has been smoking Cigarettes.  She has a 40 pack-year smoking  history. She has never used smokeless tobacco. She reports that  drinks alcohol. She reports that she does not use illicit drugs. Lives at home with her husband  Allergies  Allergen Reactions  . Codeine Itching and Rash    Family History  Problem Relation Age of Onset  . Heart disease Father   . Lung cancer Father   . Lung cancer Mother   . Diabetes Father   . Asthma Father   . Alcoholism Father    Prior to Admission medications   Medication Sig Start Date End Date Taking? Authorizing Provider  albuterol (PROAIR HFA) 108 (90 BASE) MCG/ACT inhaler Inhale 2 puffs into the lungs every 4 (four) hours as needed. : For Wheezing/shortness of breath 10/06/12  Yes Agnes I Nwoko, NP  fish oil-omega-3 fatty acids 1000 MG capsule Take 1 g by mouth daily.   Yes Historical Provider, MD  lisinopril (PRINIVIL,ZESTRIL) 10 MG tablet Take 1 tablet (10 mg total) by mouth daily. For control of high blood pressure. 10/06/12  Yes Agnes I Nwoko, NP  naltrexone (DEPADE) 50 MG tablet Take 25 mg by mouth at bedtime.   Yes Historical Provider, MD  traMADol (ULTRAM) 50 MG tablet Take 1 tablet (50 mg total) by mouth every 6 (six) hours as needed for pain. 10/14/12  Yes Tiffany G Greene, PA-C  Vilazodone HCl (VIIBRYD) 40 MG TABS Take 40 mg by mouth daily.   Yes Historical Provider, MD   Physical Exam: Filed Vitals:     12/01/12 1730 12/01/12 1731 12/01/12 1800 12/01/12 1826  BP: 95/54 95/54 88/51 93/57  Pulse:   83   Temp:      TempSrc:      Resp: 21 18 13   SpO2:  96% 96%    General appearance: Lethargic, easy to arouse but goes back to sleep immediately, no distress seems to be able to protect her airways Head: Normocephalic, without obvious abnormality, atraumatic  Eyes: conjunctivae/corneas clear. PERRL, EOM's intact. Fundi benign.  Nose: Nares normal. Septum midline. Mucosa normal. No drainage or sinus tenderness.  Throat: lips, mucosa, and tongue normal; teeth and gums normal  Neck: Supple, no masses, no  cervical lymphadenopathy, no JVD appreciated, no meningeal signs Resp: Faint expiratory wheezing, likely her baseline (she is a smoker) Chest wall: no tenderness  Cardio: regular rate and rhythm, S1, S2 normal, no murmur, click, rub or gallop  GI: soft, non-tender; bowel sounds normal; no masses, no organomegaly  Extremities: extremities normal, atraumatic, no cyanosis or edema  Skin: Skin color, texture, turgor normal. No rashes or lesions  Neurologic: normal strength and tone. Normal symmetric reflexes. Normal coordination and gait  Labs on Admission:  Basic Metabolic Panel:  Recent Labs Lab 12/01/12 1612  NA 145  K 3.3*  CL 111  CO2 21  GLUCOSE 108*  BUN 24*  CREATININE 0.74  CALCIUM 8.3*   Liver Function Tests:  Recent Labs Lab 12/01/12 1612  AST 41*  ALT 26  ALKPHOS 48  BILITOT 0.1*  PROT 7.1  ALBUMIN 3.2*   No results found for this basename: LIPASE, AMYLASE,  in the last 168 hours  Recent Labs Lab 12/01/12 1607  AMMONIA 34   CBC:  Recent Labs Lab 12/01/12 1612  WBC 7.3  NEUTROABS 4.4  HGB 11.9*  HCT 36.0  MCV 96.8  PLT 166   Cardiac Enzymes:  Recent Labs Lab 12/01/12 1612  TROPONINI <0.30    BNP (last 3 results) No results found for this basename: PROBNP,  in the last 8760 hours CBG:  Recent Labs Lab 12/01/12 1618  GLUCAP 102*    Radiological Exams on Admission: Dg Chest 2 View  12/01/2012  *RADIOLOGY REPORT*  Clinical Data: Altered mental status, history hypertension, COPD, smoking  CHEST - 2 VIEW  Comparison: 03/30/2011  Findings: Minimal enlargement of cardiac silhouette. Mildly tortuous thoracic aorta. Otherwise normal mediastinal contours and pulmonary vascularity. Lungs clear. No pleural effusion or pneumothorax. Minimal chronic peribronchial thickening noted.  IMPRESSION: Minimal chronic bronchitic changes. No acute abnormalities.   Original Report Authenticated By: Mark Boles, M.D.    Ct Head Wo Contrast  12/01/2012   *RADIOLOGY REPORT*  Clinical Data: Altered mental status and shortness of breath.  CT HEAD WITHOUT CONTRAST  Technique:  Contiguous axial images were obtained from the base of the skull through the vertex without contrast.  Comparison: 02/02/2011  Findings: Bone windows demonstrate persistent mild mucosal thickening of the right maxillary sinus.  Hyperostosis frontalis interna.  Soft tissue windows demonstrate no  mass lesion, hemorrhage, hydrocephalus, acute infarct, intra-axial, or extra-axial fluid collection.  IMPRESSION:  1. No acute intracranial abnormality. 2.  Minimal sinus disease.   Original Report Authenticated By: Kyle Talbot, M.D.     EKG: Independently reviewed.   Assessment/Plan Principal Problem:   Lethargy Active Problems:   TOBACCO USE   HYPERTENSION   Alcohol dependence   Major depression   Salicylate overdose   Lethargy -Unclear etiology for now, clear CT scan, normal ammonia and PCO2 levels. -Till   the time of this dictation UDS is pending, urinalysis also is pending. -Patient is on tramadol and naltrexone, naloxone given in the emergency department with minimal effect. -I sent for blood culture, started her empirically on Rocephin.  Salicylate overdose, mild -The salicylate level came back (while am dictating this note) at 34.9. -This might explain the mild acidosis on the ABG. This is just above the therapeutic level of 10-30 mg/dL -EDP contacted the Poison Control Center, recommended fluids at this point, target urine output 2 mL/Kg/day -Coingestion of medications is common, I wonder if she overdosed on her Viibryd or tramadol. -Because of recent suicidal attempt, consider psych eval for suicidal ideation/attempt when she is more alert.  Alcohol abuse -Per her daughter she is going to [facility], she's been sober for the past 30 days. -She is on naltrexone 25 mg by mouth each bedtime at night. -Naltrexone can cause dizziness and somnolence. Hold  ultram  Hypotension -Patient is on lisinopril for hypertension, we'll hold blood pressure medications. -Blood pressure was low but responded to IV fluids will give bolus of 2 L of IV fluids.  Major depression -With recent history of suicidal attempt/ideation and admission to behavioral health hospital in January of 2014. -We'll hold her antidepressant for now, restart when clinically appropriate.  Code Status: Full code Family Communication: Plan discussed with her daughter Disposition Plan: Stepdown, for lethargy, expected length of stay more then 2 midnight  Time spent: 70 minutes  ELMAHI,MUTAZ A [facility] Pager [phone]  If 7PM-7AM, please contact night-coverage [email or web address] Password [email or web address] 12/01/2012, 6:52 PM       

## 2012-12-01 NOTE — Progress Notes (Signed)
Notified md about pt's bp.  New orders received. [name] [name]

## 2012-12-01 NOTE — ED Provider Notes (Signed)
History     CSN: [id number]  Arrival date & time 12/01/12  1502   First MD Initiated Contact with Patient 12/01/12 1507      Chief Complaint  Patient presents with  . Shortness of Breath    (Consider location/radiation/quality/duration/timing/severity/associated sxs/prior treatment) HPI Comments: Patient presents via EMS with difficulty breathing and confusion. Per EMS she's been wheezing and having inappropriate behavior and after. Her daughter found her eating a sandwich with the wrapper still on it. She's wheezing on EMS arrival given the nebulizer. She is oriented to herself and to place. She denies any pain. She denies any abdominal pain, nausea, vomiting, chest pain or shortness of breath. She states she vomited a couple times yesterday. She denies any leg pain or swelling.  The history is provided by the EMS personnel and the patient. The history is limited by the condition of the patient.    Past Medical History  Diagnosis Date  . Respiratory failure with hypoxia   . Pneumonia   . Altered mental status     secondary to ethyl alcohol withdrawal  . Systemic inflammatory response syndrome   . Tobacco abuse   . Diastolic congestive heart failure   . Depression   . HTN (hypertension)   . ETOH abuse   . Suicidal ideation   . COPD (chronic obstructive pulmonary disease)     Past Surgical History  Procedure Laterality Date  . Cesarean section    . Tubal ligation    . Cholecystectomy    . Cystectomy      both ears  . Total abdominal hysterectomy      Family History  Problem Relation Age of Onset  . Heart disease Father   . Lung cancer Father   . Lung cancer Mother   . Diabetes Father   . Asthma Father   . Alcoholism Father     History  Substance Use Topics  . Smoking status: Current Every Day Smoker -- 1.00 packs/day for 40 years    Types: Cigarettes  . Smokeless tobacco: Never Used     Comment: relapsed smoking june 2012  . Alcohol Use: Yes     Comment:  drinks daily    OB History   Grav Para Term Preterm Abortions TAB SAB Ect Mult Living                  Review of Systems  Unable to perform ROS: Mental status change  Respiratory: Positive for shortness of breath.     Allergies  Codeine  Home Medications   Current Outpatient Rx  Name  Route  Sig  Dispense  Refill  . albuterol (PROAIR HFA) 108 (90 BASE) MCG/ACT inhaler   Inhalation   Inhale 2 puffs into the lungs every 4 (four) hours as needed. : For Wheezing/shortness of breath         . fish oil-omega-3 fatty acids 1000 MG capsule   Oral   Take 1 g by mouth daily.         Marland Kitchen lisinopril (PRINIVIL,ZESTRIL) 10 MG tablet   Oral   Take 1 tablet (10 mg total) by mouth daily. For control of high blood pressure.   30 tablet   0   . naltrexone (DEPADE) 50 MG tablet   Oral   Take 25 mg by mouth at bedtime.         . traMADol (ULTRAM) 50 MG tablet   Oral   Take 1 tablet (50 mg total) by mouth  every 6 (six) hours as needed for pain.   15 tablet   0   . Vilazodone HCl (VIIBRYD) 40 MG TABS   Oral   Take 40 mg by mouth daily.           BP 95/54  Pulse 85  Temp(Src) 98 F (36.7 C) (Oral)  Resp 18  SpO2 96%  Physical Exam  Constitutional: She appears well-developed and well-nourished. No distress.  Speaking in full senses, no distress  HENT:  Head: Normocephalic and atraumatic.  Mouth/Throat: Oropharynx is clear and moist. No oropharyngeal exudate.  Eyes: Conjunctivae and EOM are normal. Pupils are equal, round, and reactive to light.  Neck: Normal range of motion. Neck supple.  Cardiovascular: Normal rate, regular rhythm and normal heart sounds.   No murmur heard. Pulmonary/Chest: Effort normal and breath sounds normal. No respiratory distress.  Abdominal: Soft. There is no tenderness. There is no rebound and no guarding.  Musculoskeletal: Normal range of motion. She exhibits no edema and no tenderness.  Neurological: She is alert. No cranial nerve  deficit. She exhibits normal muscle tone. Coordination normal.  Cranial nerves 2-12 intact, 5 out of 5 strength throughout, no ataxia finger to nose, no pronator drift  Skin: Skin is warm.    ED Course  Procedures (including critical care time)  Labs Reviewed  CBC WITH DIFFERENTIAL - Abnormal; Notable for the following:    RBC 3.72 (*)    Hemoglobin 11.9 (*)    RDW 15.9 (*)    All other components within normal limits  COMPREHENSIVE METABOLIC PANEL - Abnormal; Notable for the following:    Potassium 3.3 (*)    Glucose, Bld 108 (*)    BUN 24 (*)    Calcium 8.3 (*)    Albumin 3.2 (*)    AST 41 (*)    Total Bilirubin 0.1 (*)    All other components within normal limits  GLUCOSE, CAPILLARY - Abnormal; Notable for the following:    Glucose-Capillary 102 (*)    All other components within normal limits  POCT I-STAT 3, BLOOD GAS (G3+) - Abnormal; Notable for the following:    pH, Arterial 7.313 (*)    pO2, Arterial 77.0 (*)    Acid-base deficit 5.0 (*)    All other components within normal limits  TROPONIN I  LACTIC ACID, PLASMA  AMMONIA  ETHANOL  URINALYSIS, ROUTINE W REFLEX MICROSCOPIC  ACETAMINOPHEN LEVEL  SALICYLATE LEVEL  CARBOXYHEMOGLOBIN  URINE RAPID DRUG SCREEN (HOSP PERFORMED)   Dg Chest 2 View  12/01/2012  *RADIOLOGY REPORT*  Clinical Data: Altered mental status, history hypertension, COPD, smoking  CHEST - 2 VIEW  Comparison: 03/30/2011  Findings: Minimal enlargement of cardiac silhouette. Mildly tortuous thoracic aorta. Otherwise normal mediastinal contours and pulmonary vascularity. Lungs clear. No pleural effusion or pneumothorax. Minimal chronic peribronchial thickening noted.  IMPRESSION: Minimal chronic bronchitic changes. No acute abnormalities.   Original Report Authenticated By: Ulyses Southward, M.D.    Ct Head Wo Contrast  12/01/2012  *RADIOLOGY REPORT*  Clinical Data: Altered mental status and shortness of breath.  CT HEAD WITHOUT CONTRAST  Technique:  Contiguous  axial images were obtained from the base of the skull through the vertex without contrast.  Comparison: 02/02/2011  Findings: Bone windows demonstrate persistent mild mucosal thickening of the right maxillary sinus.  Hyperostosis frontalis interna.  Soft tissue windows demonstrate no  mass lesion, hemorrhage, hydrocephalus, acute infarct, intra-axial, or extra-axial fluid collection.  IMPRESSION:  1. No acute intracranial abnormality. 2.  Minimal sinus disease.   Original Report Authenticated By: Jeronimo Greaves, M.D.      1. Altered mental status   2. COPD (chronic obstructive pulmonary disease)       MDM  Confusion and mental status change. Oriented x2. No narcotics the patient's med list. No response to narcan. Moving all extremities without focal deficits.  CT head negative.  CXR negative. No CO2 retention on ABG.  Salicylate level elevated at 34.9. Discussed with poison center. Do not recommend alkalinization below 40 recommend repeat level in 3 hours. Continue IV fluids. Will hydrate cautiously given patient's history of CHF.  Uncertain etiology of patient's altered mental status and lethargy. She has hypotension into the 80s it is responsive to fluids to the 110s. No evidence of infection and chest x-ray are urine sample. She is somnolent but arousable and quickly falls back asleep. She is protecting her airway. Lactate normal, white blood cell count normal. No evidence of infection at this point. D/w Dr. Arthor Captain who will evaluate patient and will start empiric rocephin.    Date: 12/01/2012  Rate: 86  Rhythm: normal sinus rhythm  QRS A She is a is: normal  Intervals: normal  ST/T Wave abnormalities: normal  Conduction Disutrbances:none  Narrative Interpretation:   Old EKG Reviewed: unchanged   CRITICAL CARE Performed by: Glynn Octave   Total critical care time: 30  Critical care time was exclusive of separately billable procedures and treating other patients.  Critical  care was necessary to treat or prevent imminent or life-threatening deterioration.  Critical care was time spent personally by me on the following activities: development of treatment plan with patient and/or surrogate as well as nursing, discussions with consultants, evaluation of patient's response to treatment, examination of patient, obtaining history from patient or surrogate, ordering and performing treatments and interventions, ordering and review of laboratory studies, ordering and review of radiographic studies, pulse oximetry and re-evaluation of patient's condition.       Glynn Octave, MD 12/01/12 2352

## 2012-12-01 NOTE — Progress Notes (Addendum)
  Night Cover:  Comment: Informed by RN, that patient's BP is 78/98. Patient admitted 12/01/12, with AMS/Hypotension of unclear etiology. Foer details refer to admission H&P, by Dr Arthor Captain. Patient has known history of HTN, COPD, depression, tobacco abuse, diastolic CHF, ETOH abuse.   Patient is afebrile, wcc is normal, head CT and CXR are negative for acute findings.  UDS is positive only for amphetamine. NH3 is negative, as is U/A.   O/E patient is alert, communicative, following simple instructions. Shivering. CHEST: Cliically clear.  HEART: 1 and 2 heard, regular , no murmurs, ABDO: Benign.   A/P: 1. AMS; Etiology still unclear, but liely multifactorial and toxic-metabolic. Already improving. 2. Hypotension: Antihypertensives on hold, but still refractory. Major concern is possible early sepsis. Will manage with aggressive iv fluids, send off blood cultures, cortisol levels and Procalcitonin. For now, will empirically broaden antibiotic coverage with Vanc/Primaxin.   Francisco Capuchin, MD, FACP.    Despite above measures, SBP dropped again to 60. Discussed with Dr Darrick Penna in E-Link. Recommended pressor. PCCM consult will be provided.

## 2012-12-01 NOTE — ED Notes (Signed)
Pt's daughter at bedside, reports the pt has been in and out of her PCP office the past couple weeks and had lot of bld work drawn but never told any of the results. Pt's daughter reports she is still waiting to hear back from them. sts her mother is normally more alert and not this sleepy.

## 2012-12-01 NOTE — ED Notes (Signed)
Called resp to start Bipap

## 2012-12-01 NOTE — ED Notes (Signed)
Pt returned from radiology and placed back on monitor

## 2012-12-01 NOTE — ED Notes (Signed)
Pt talking to family. Eyes open and looking around.

## 2012-12-01 NOTE — Progress Notes (Deleted)
[facility] History and Physical  [name] [name]:[id number] DOB: 01-Apr-1951 DOA: 12/01/2012  Referring physician: Glynn Octave, MD   PCP: Buren Kos, MD   Chief Complaint: Lethargy  HPI: [name] [name] is a 62 y.o. female with testicle history of alcohol abuse, hypertension and major depression. Patient had recent admission to behavioral [facility] for suicidal attempt with cutting her wrist. Patient presents today to the emergency department because of confusion. The history was obtained primarily from the daughter, she said she had some confusion about 2 weeks ago and workup was done on Dr. Jeannetta Nap office, they did not call them with the blood work results. She started to have increasing confusion and lethargy since last night, today she can even stay awake. Upon initial evaluation in the emergency department patient is extremely lethargic but she opened her eyes her concentration span is probably about 5 seconds, CT scan of the head was negative, her blood alcohol level is <11, normal ammonia level, ABG showed normal PCO2 with mild acidosis. The rest of the studies including UDS, urinalysis are pending at the time of dictation.  Review of Systems:  Review of systems is unobtainable because of lethargy.  Past Medical History  Diagnosis Date  . Respiratory failure with hypoxia   . Pneumonia   . Altered mental status     secondary to ethyl alcohol withdrawal  . Systemic inflammatory response syndrome   . Tobacco abuse   . Diastolic congestive heart failure   . Depression   . HTN (hypertension)   . ETOH abuse   . Suicidal ideation   . COPD (chronic obstructive pulmonary disease)    Past Surgical History  Procedure Laterality Date  . Cesarean section    . Tubal ligation    . Cholecystectomy    . Cystectomy      both ears  . Total abdominal hysterectomy     Social History:  reports that she has been smoking Cigarettes.  She has a 40 pack-year smoking  history. She has never used smokeless tobacco. She reports that  drinks alcohol. She reports that she does not use illicit drugs. Lives at home with her husband  Allergies  Allergen Reactions  . Codeine Itching and Rash    Family History  Problem Relation Age of Onset  . Heart disease Father   . Lung cancer Father   . Lung cancer Mother   . Diabetes Father   . Asthma Father   . Alcoholism Father    Prior to Admission medications   Medication Sig Start Date End Date Taking? Authorizing Merdith Boyd  albuterol (PROAIR HFA) 108 (90 BASE) MCG/ACT inhaler Inhale 2 puffs into the lungs every 4 (four) hours as needed. : For Wheezing/shortness of breath 10/06/12  Yes Sanjuana Kava, NP  fish oil-omega-3 fatty acids 1000 MG capsule Take 1 g by mouth daily.   Yes Historical Caddie Randle, MD  lisinopril (PRINIVIL,ZESTRIL) 10 MG tablet Take 1 tablet (10 mg total) by mouth daily. For control of high blood pressure. 10/06/12  Yes Sanjuana Kava, NP  naltrexone (DEPADE) 50 MG tablet Take 25 mg by mouth at bedtime.   Yes Historical Cyndee Giammarco, MD  traMADol (ULTRAM) 50 MG tablet Take 1 tablet (50 mg total) by mouth every 6 (six) hours as needed for pain. 10/14/12  Yes Tiffany Irine Seal, PA-C  Vilazodone HCl (VIIBRYD) 40 MG TABS Take 40 mg by mouth daily.   Yes Historical Barkley Kratochvil, MD   Physical Exam: Filed Vitals:  12/01/12 1630 12/01/12 1645 12/01/12 1700 12/01/12 1731  BP: 95/57 102/56 87/54 95/54   Pulse: 88 144 85   Temp:      TempSrc:      Resp: 13 18 19 18   SpO2: 97% 96% 95% 96%   General appearance: Lethargic, easy to arouse but goes back to sleep immediately, no distress seems to be able to protect her airways Head: Normocephalic, without obvious abnormality, atraumatic  Eyes: conjunctivae/corneas clear. PERRL, EOM's intact. Fundi benign.  Nose: Nares normal. Septum midline. Mucosa normal. No drainage or sinus tenderness.  Throat: lips, mucosa, and tongue normal; teeth and gums normal  Neck: Supple,  no masses, no cervical lymphadenopathy, no JVD appreciated, no meningeal signs Resp: Faint expiratory wheezing, likely her baseline (she is a smoker) Chest wall: no tenderness  Cardio: regular rate and rhythm, S1, S2 normal, no murmur, click, rub or gallop  GI: soft, non-tender; bowel sounds normal; no masses, no organomegaly  Extremities: extremities normal, atraumatic, no cyanosis or edema  Skin: Skin color, texture, turgor normal. No rashes or lesions  Neurologic: normal strength and tone. Normal symmetric reflexes. Normal coordination and gait  Labs on Admission:  Basic Metabolic Panel:  Recent Labs Lab 12/01/12 1612  NA 145  K 3.3*  CL 111  CO2 21  GLUCOSE 108*  BUN 24*  CREATININE 0.74  CALCIUM 8.3*   Liver Function Tests:  Recent Labs Lab 12/01/12 1612  AST 41*  ALT 26  ALKPHOS 48  BILITOT 0.1*  PROT 7.1  ALBUMIN 3.2*   No results found for this basename: LIPASE, AMYLASE,  in the last 168 hours  Recent Labs Lab 12/01/12 1607  AMMONIA 34   CBC:  Recent Labs Lab 12/01/12 1612  WBC 7.3  NEUTROABS 4.4  HGB 11.9*  HCT 36.0  MCV 96.8  PLT 166   Cardiac Enzymes:  Recent Labs Lab 12/01/12 1612  TROPONINI <0.30    BNP (last 3 results) No results found for this basename: PROBNP,  in the last 8760 hours CBG:  Recent Labs Lab 12/01/12 1618  GLUCAP 102*    Radiological Exams on Admission: Dg Chest 2 View  12/01/2012  *RADIOLOGY REPORT*  Clinical Data: Altered mental status, history hypertension, COPD, smoking  CHEST - 2 VIEW  Comparison: 03/30/2011  Findings: Minimal enlargement of cardiac silhouette. Mildly tortuous thoracic aorta. Otherwise normal mediastinal contours and pulmonary vascularity. Lungs clear. No pleural effusion or pneumothorax. Minimal chronic peribronchial thickening noted.  IMPRESSION: Minimal chronic bronchitic changes. No acute abnormalities.   Original Report Authenticated By: Ulyses Southward, M.D.    Ct Head Wo  Contrast  12/01/2012  *RADIOLOGY REPORT*  Clinical Data: Altered mental status and shortness of breath.  CT HEAD WITHOUT CONTRAST  Technique:  Contiguous axial images were obtained from the base of the skull through the vertex without contrast.  Comparison: 02/02/2011  Findings: Bone windows demonstrate persistent mild mucosal thickening of the right maxillary sinus.  Hyperostosis frontalis interna.  Soft tissue windows demonstrate no  mass lesion, hemorrhage, hydrocephalus, acute infarct, intra-axial, or extra-axial fluid collection.  IMPRESSION:  1. No acute intracranial abnormality. 2.  Minimal sinus disease.   Original Report Authenticated By: Jeronimo Greaves, M.D.     EKG: Independently reviewed.   Assessment/Plan Principal Problem:   Lethargy Active Problems:   TOBACCO USE   HYPERTENSION   Alcohol dependence   Major depression   Lethargy -Unclear etiology for now, clear CT scan, normal ammonia and PCO2 levels. -Till the time of this  dictation UDS is pending, urinalysis also is pending. -Patient is on tramadol and naltrexone, naloxone given in the emergency department with minimal effect. -I sent for blood culture, started her empirically on Rocephin.  Salicylate overdose, mild -The salicylate level came back (while am dictating this note) at 34.9. -This might explain the mild acidosis on the ABG. This is just above the therapeutic level of 10-30 mg/dL -EDP contacted the [facility], recommended fluids at this point, target urine output 2 mL/Kg/day -Coingestion of medications is common, I wonder if she overdosed on her Viibryd or tramadol. -Because of recent suicidal attempt, consider psych eval for suicidal ideation/attempt when she is more alert.  Alcohol abuse -Per her daughter she is going to [facility], she's been sober for the past 30 days. -She is on naltrexone 25 mg by mouth each bedtime at night. -Naltrexone can cause dizziness and somnolence. Hold  ultram  Hypotension -Patient is on lisinopril for hypertension, we'll hold blood pressure medications. -Blood pressure was low but responded to IV fluids will give bolus of 2 L of IV fluids.  Major depression -With recent history of suicidal attempt/ideation and admission to behavioral health hospital in January of 2014. -We'll hold her antidepressant for now, restart when clinically appropriate.  Code Status: Full code Family Communication: Plan discussed with her daughter Disposition Plan: Stepdown, for lethargy, expected length of stay more then 2 midnight  Time spent: 70 minutes  [facility] A [facility] Pager [phone]  If 7PM-7AM, please contact night-coverage [email or web address] Password [facility] 12/01/2012, 6:11 PM

## 2012-12-01 NOTE — ED Notes (Signed)
RT at bedside.

## 2012-12-01 NOTE — Progress Notes (Signed)
eLink Physician-Brief Progress Note Patient Name: [name] [name] DOB: [birth year] MRN: [id number]  Date of Service  12/01/2012   HPI/Events of Note  Call from nurse reporting ongoing hypotension in patient admitted with concern for sepsis syndrome.  Currently receiving bolus NS for BP support with current BP of 60/25 (36).  Does have some UOP.  O2 sats of 91 with RR of 15 and HR of 81.  Lactate is WNL and WBC is also WNL   eICU Interventions  Plan: Start dopamine for BP support Continue with IVFs Insert aline to verify BP Will have PCCM stop in and assess patient at bedside. May need CVL to assess volume status   Intervention Category Intermediate Interventions: Hypotension - evaluation and management  DETERDING,ELIZABETH 12/01/2012, 9:13 PM

## 2012-12-01 NOTE — Progress Notes (Signed)
Notified md about pts bp 78/58.  New orders received. Will continue to monitor. Emilie [place]

## 2012-12-01 NOTE — Progress Notes (Deleted)
[facility] History and Physical  [name] [name] ZOX:[id number] DOB: November 10, 1950 DOA: 12/01/2012  Referring physician: Glynn Octave, MD   PCP: Buren Kos, MD   Chief Complaint: Lethargy  HPI: [name] [name] is a 62 y.o. female with testicle history of alcohol abuse, hypertension and major depression. Patient had recent admission to [facility] for suicidal attempt with cutting her wrist. Patient presents today to the emergency department because of confusion. The history was obtained primarily from the daughter, she said she had some confusion about 2 weeks ago and workup was done on Dr. Jeannetta Nap office, they did not call them with the blood work results. She started to have increasing confusion and lethargy since last night, today she can even stay awake. Upon initial evaluation in the emergency department patient is extremely lethargic but she opened her eyes her concentration span is probably about 5 seconds, CT scan of the head was negative, her blood alcohol level is <11, normal ammonia level, ABG showed normal PCO2 with mild acidosis. The rest of the studies including UDS, urinalysis are pending at the time of dictation.  Review of Systems:  Review of systems is unobtainable because of lethargy.  Past Medical History  Diagnosis Date  . Respiratory failure with hypoxia   . Pneumonia   . Altered mental status     secondary to ethyl alcohol withdrawal  . Systemic inflammatory response syndrome   . Tobacco abuse   . Diastolic congestive heart failure   . Depression   . HTN (hypertension)   . ETOH abuse   . Suicidal ideation   . COPD (chronic obstructive pulmonary disease)    Past Surgical History  Procedure Laterality Date  . Cesarean section    . Tubal ligation    . Cholecystectomy    . Cystectomy      both ears  . Total abdominal hysterectomy     Social History:  reports that she has been smoking Cigarettes.  She has a 40 pack-year smoking  history. She has never used smokeless tobacco. She reports that  drinks alcohol. She reports that she does not use illicit drugs. Lives at home with her husband  Allergies  Allergen Reactions  . Codeine Itching and Rash    Family History  Problem Relation Age of Onset  . Heart disease Father   . Lung cancer Father   . Lung cancer Mother   . Diabetes Father   . Asthma Father   . Alcoholism Father    Prior to Admission medications   Medication Sig Start Date End Date Taking? Authorizing Provider  albuterol (PROAIR HFA) 108 (90 BASE) MCG/ACT inhaler Inhale 2 puffs into the lungs every 4 (four) hours as needed. : For Wheezing/shortness of breath 10/06/12  Yes Sanjuana Kava, NP  fish oil-omega-3 fatty acids 1000 MG capsule Take 1 g by mouth daily.   Yes Historical Provider, MD  lisinopril (PRINIVIL,ZESTRIL) 10 MG tablet Take 1 tablet (10 mg total) by mouth daily. For control of high blood pressure. 10/06/12  Yes Sanjuana Kava, NP  naltrexone (DEPADE) 50 MG tablet Take 25 mg by mouth at bedtime.   Yes Historical Provider, MD  traMADol (ULTRAM) 50 MG tablet Take 1 tablet (50 mg total) by mouth every 6 (six) hours as needed for pain. 10/14/12  Yes Tiffany Irine Seal, PA-C  Vilazodone HCl (VIIBRYD) 40 MG TABS Take 40 mg by mouth daily.   Yes Historical Provider, MD   Physical Exam: Filed Vitals:  12/01/12 1730 12/01/12 1731 12/01/12 1800 12/01/12 1826  BP: 95/54 95/54 88/51  93/57  Pulse:   83   Temp:      TempSrc:      Resp: 21 18 13    SpO2:  96% 96%    General appearance: Lethargic, easy to arouse but goes back to sleep immediately, no distress seems to be able to protect her airways Head: Normocephalic, without obvious abnormality, atraumatic  Eyes: conjunctivae/corneas clear. PERRL, EOM's intact. Fundi benign.  Nose: Nares normal. Septum midline. Mucosa normal. No drainage or sinus tenderness.  Throat: lips, mucosa, and tongue normal; teeth and gums normal  Neck: Supple, no masses, no  cervical lymphadenopathy, no JVD appreciated, no meningeal signs Resp: Faint expiratory wheezing, likely her baseline (she is a smoker) Chest wall: no tenderness  Cardio: regular rate and rhythm, S1, S2 normal, no murmur, click, rub or gallop  GI: soft, non-tender; bowel sounds normal; no masses, no organomegaly  Extremities: extremities normal, atraumatic, no cyanosis or edema  Skin: Skin color, texture, turgor normal. No rashes or lesions  Neurologic: normal strength and tone. Normal symmetric reflexes. Normal coordination and gait  Labs on Admission:  Basic Metabolic Panel:  Recent Labs Lab 12/01/12 1612  NA 145  K 3.3*  CL 111  CO2 21  GLUCOSE 108*  BUN 24*  CREATININE 0.74  CALCIUM 8.3*   Liver Function Tests:  Recent Labs Lab 12/01/12 1612  AST 41*  ALT 26  ALKPHOS 48  BILITOT 0.1*  PROT 7.1  ALBUMIN 3.2*   No results found for this basename: LIPASE, AMYLASE,  in the last 168 hours  Recent Labs Lab 12/01/12 1607  AMMONIA 34   CBC:  Recent Labs Lab 12/01/12 1612  WBC 7.3  NEUTROABS 4.4  HGB 11.9*  HCT 36.0  MCV 96.8  PLT 166   Cardiac Enzymes:  Recent Labs Lab 12/01/12 1612  TROPONINI <0.30    BNP (last 3 results) No results found for this basename: PROBNP,  in the last 8760 hours CBG:  Recent Labs Lab 12/01/12 1618  GLUCAP 102*    Radiological Exams on Admission: Dg Chest 2 View  12/01/2012  *RADIOLOGY REPORT*  Clinical Data: Altered mental status, history hypertension, COPD, smoking  CHEST - 2 VIEW  Comparison: 03/30/2011  Findings: Minimal enlargement of cardiac silhouette. Mildly tortuous thoracic aorta. Otherwise normal mediastinal contours and pulmonary vascularity. Lungs clear. No pleural effusion or pneumothorax. Minimal chronic peribronchial thickening noted.  IMPRESSION: Minimal chronic bronchitic changes. No acute abnormalities.   Original Report Authenticated By: Ulyses Southward, M.D.    Ct Head Wo Contrast  12/01/2012   *RADIOLOGY REPORT*  Clinical Data: Altered mental status and shortness of breath.  CT HEAD WITHOUT CONTRAST  Technique:  Contiguous axial images were obtained from the base of the skull through the vertex without contrast.  Comparison: 02/02/2011  Findings: Bone windows demonstrate persistent mild mucosal thickening of the right maxillary sinus.  Hyperostosis frontalis interna.  Soft tissue windows demonstrate no  mass lesion, hemorrhage, hydrocephalus, acute infarct, intra-axial, or extra-axial fluid collection.  IMPRESSION:  1. No acute intracranial abnormality. 2.  Minimal sinus disease.   Original Report Authenticated By: Jeronimo Greaves, M.D.     EKG: Independently reviewed.   Assessment/Plan Principal Problem:   Lethargy Active Problems:   TOBACCO USE   HYPERTENSION   Alcohol dependence   Major depression   Salicylate overdose   Lethargy -Unclear etiology for now, clear CT scan, normal ammonia and PCO2 levels. -Till  the time of this dictation UDS is pending, urinalysis also is pending. -Patient is on tramadol and naltrexone, naloxone given in the emergency department with minimal effect. -I sent for blood culture, started her empirically on Rocephin.  Salicylate overdose, mild -The salicylate level came back (while am dictating this note) at 34.9. -This might explain the mild acidosis on the ABG. This is just above the therapeutic level of 10-30 mg/dL -EDP contacted the [facility], recommended fluids at this point, target urine output 2 mL/Kg/day -Coingestion of medications is common, I wonder if she overdosed on her Viibryd or tramadol. -Because of recent suicidal attempt, consider psych eval for suicidal ideation/attempt when she is more alert.  Alcohol abuse -Per her daughter she is going to [facility], she's been sober for the past 30 days. -She is on naltrexone 25 mg by mouth each bedtime at night. -Naltrexone can cause dizziness and somnolence. Hold  ultram  Hypotension -Patient is on lisinopril for hypertension, we'll hold blood pressure medications. -Blood pressure was low but responded to IV fluids will give bolus of 2 L of IV fluids.  Major depression -With recent history of suicidal attempt/ideation and admission to behavioral health hospital in January of 2014. -We'll hold her antidepressant for now, restart when clinically appropriate.  Code Status: Full code Family Communication: Plan discussed with her daughter Disposition Plan: Stepdown, for lethargy, expected length of stay more then 2 midnight  Time spent: 70 minutes  [facility] Pager [phone]  If 7PM-7AM, please contact night-coverage [email or web address] Password [facility] 12/01/2012, 6:52 PM

## 2012-12-01 NOTE — Progress Notes (Signed)
ANTIBIOTIC CONSULT NOTE - INITIAL  Pharmacy Consult for Primaxin and Vancomycin Indication: AMS, hypotension - empiric broad spectrum coverage for r/o sepsis  Allergies  Allergen Reactions  . Penicillins     unknown  . Codeine Itching and Rash    Patient Measurements: Height: 5\' 2"  (157.5 cm) Weight: 186 lb 11.7 oz (84.7 kg) IBW/kg (Calculated) : 50.1  Vital Signs: Temp: 98.4 F (36.9 C) (03/07 1939) Temp src: Oral (03/07 1939) BP: 89/52 mmHg (03/07 2000) Pulse Rate: 86 (03/07 2000) Intake/Output from previous day:   Intake/Output from this shift:    Labs:  Recent Labs  12/01/12 1612  WBC 7.3  HGB 11.9*  PLT 166  CREATININE 0.74   Estimated Creatinine Clearance: 76.4 ml/min (by C-G formula based on Cr of 0.74). No results found for this basename: VANCOTROUGH, VANCOPEAK, VANCORANDOM, GENTTROUGH, GENTPEAK, GENTRANDOM, TOBRATROUGH, TOBRAPEAK, TOBRARND, AMIKACINPEAK, AMIKACINTROU, AMIKACIN,  in the last 72 hours   Microbiology: No results found for this or any previous visit (from the past 720 hour(s)).  Medical History: Past Medical History  Diagnosis Date  . Respiratory failure with hypoxia   . Pneumonia   . Altered mental status     secondary to ethyl alcohol withdrawal  . Systemic inflammatory response syndrome   . Tobacco abuse   . Diastolic congestive heart failure   . Depression   . HTN (hypertension)   . ETOH abuse   . Suicidal ideation   . COPD (chronic obstructive pulmonary disease)   . Anxiety   . Shortness of breath   . Asthma   . Headache   . Arthritis   . Anemia     Medications:  Prescriptions prior to admission  Medication Sig Dispense Refill  . albuterol (PROAIR HFA) 108 (90 BASE) MCG/ACT inhaler Inhale 2 puffs into the lungs every 4 (four) hours as needed. : For Wheezing/shortness of breath      . fish oil-omega-3 fatty acids 1000 MG capsule Take 1 g by mouth daily.      Marland Kitchen lisinopril (PRINIVIL,ZESTRIL) 10 MG tablet Take 1 tablet (10  mg total) by mouth daily. For control of high blood pressure.  30 tablet  0  . naltrexone (DEPADE) 50 MG tablet Take 25 mg by mouth at bedtime.      . traMADol (ULTRAM) 50 MG tablet Take 1 tablet (50 mg total) by mouth every 6 (six) hours as needed for pain.  15 tablet  0  . Vilazodone HCl (VIIBRYD) 40 MG TABS Take 40 mg by mouth daily.       Assessment: 62 y.o. female presents to ED with lethargy, confusion, hypotension. Noted to have recent admission to behavioral health hospital for suicidal attempt. To begin broad spectrum antibiotics for suspected infection/r/o sepsis. Cultures pending. Rocephin 1gm given in ED.  Goal of Therapy:  Vancomycin trough level 10-15 mcg/ml  Plan:  1. Vancomycin 1gm IV q12h.  2. Imipenem 500mg  IV q6h. 3. Will f/u microbiological data, renal function, and pt's clinical condition  Christoper Fabian, PharmD, BCPS Clinical pharmacist, pager [phone] 12/01/2012,8:42 PM

## 2012-12-01 NOTE — ED Notes (Signed)
MD notified of pt's BP. Pt in nad, skin warm and dry, good pulses.

## 2012-12-01 NOTE — ED Notes (Signed)
Room air trial - pt O2 sats at 91%, pt has snoring respirations. Pt in nad, skin warm and dry, resp e/u.

## 2012-12-01 NOTE — ED Notes (Signed)
Admitting physician at bedside

## 2012-12-01 NOTE — ED Notes (Signed)
Per EMS - pt coming from home. Pt's daughter went to get pt d/t power being out. Pt was eating a sandwich with wrapper still on it, so the daughter called EMS. audible exp wheezing. Daughter reports only about a quarter of her lungs are functioning. Pt recently here for multiple organ failure. Pt has hx of ETOH abuse but reports she has been going to AA. Pt laughing on the ride here, reports she was "laughing at the guy that is chasing [place]". Initial BP 120/60 last 82/58 HR 90s. EMS started a 20G in left forearm. Pt reports she has had a cold for a few days and coughing up mucus, pt reports some vomiting the past couple days, none today. EMS administered 2 nebulizer treatments.

## 2012-12-02 ENCOUNTER — Inpatient Hospital Stay (HOSPITAL_COMMUNITY): Payer: Medicaid Other

## 2012-12-02 DIAGNOSIS — I959 Hypotension, unspecified: Secondary | ICD-10-CM

## 2012-12-02 DIAGNOSIS — F1020 Alcohol dependence, uncomplicated: Secondary | ICD-10-CM

## 2012-12-02 DIAGNOSIS — D649 Anemia, unspecified: Secondary | ICD-10-CM

## 2012-12-02 DIAGNOSIS — R4182 Altered mental status, unspecified: Secondary | ICD-10-CM

## 2012-12-02 DIAGNOSIS — R404 Transient alteration of awareness: Secondary | ICD-10-CM

## 2012-12-02 DIAGNOSIS — J449 Chronic obstructive pulmonary disease, unspecified: Secondary | ICD-10-CM

## 2012-12-02 DIAGNOSIS — J4489 Other specified chronic obstructive pulmonary disease: Secondary | ICD-10-CM

## 2012-12-02 DIAGNOSIS — J9589 Other postprocedural complications and disorders of respiratory system, not elsewhere classified: Secondary | ICD-10-CM

## 2012-12-02 DIAGNOSIS — T39094A Poisoning by salicylates, undetermined, initial encounter: Secondary | ICD-10-CM

## 2012-12-02 LAB — BASIC METABOLIC PANEL
BUN: 10 mg/dL (ref 6–23)
BUN: 7 mg/dL (ref 6–23)
CO2: 21 mEq/L (ref 19–32)
CO2: 18 mEq/L — ABNORMAL LOW (ref 19–32)
Calcium: 7.9 mg/dL — ABNORMAL LOW (ref 8.4–10.5)
Calcium: 6.5 mg/dL — ABNORMAL LOW (ref 8.4–10.5)
Chloride: 114 mEq/L — ABNORMAL HIGH (ref 96–112)
Chloride: 109 mEq/L (ref 96–112)
Creatinine, Ser: 0.44 mg/dL — ABNORMAL LOW (ref 0.50–1.10)
Creatinine, Ser: 0.34 mg/dL — ABNORMAL LOW (ref 0.50–1.10)
Creatinine, Ser: 0.4 mg/dL — ABNORMAL LOW (ref 0.50–1.10)
GFR calc Af Amer: >90 mL/min (ref >90)
GFR calc non Af Amer: >90 mL/min (ref >90)
GFR calc non Af Amer: >90 mL/min (ref >90)
Glucose, Bld: 105 mg/dL — ABNORMAL HIGH (ref 70–99)
Glucose, Bld: 87 mg/dL (ref 70–99)
Potassium: 3.8 mEq/L (ref 3.5–5.1)

## 2012-12-02 LAB — SALICYLATE LEVEL
Salicylate Lvl: 23.5 mg/dL — ABNORMAL HIGH (ref 2.8–20.0)
Salicylate Lvl: 18.2 mg/dL (ref 2.8–20.0)

## 2012-12-02 LAB — COMPREHENSIVE METABOLIC PANEL
AST: 40 U/L — ABNORMAL HIGH (ref 0–37)
Albumin: 3 g/dL — ABNORMAL LOW (ref 3.5–5.2)
BUN: 17 mg/dL (ref 6–23)
CO2: 21 mEq/L (ref 19–32)
Calcium: 7.5 mg/dL — ABNORMAL LOW (ref 8.4–10.5)
Calcium: 7.6 mg/dL — ABNORMAL LOW (ref 8.4–10.5)
Chloride: 113 mEq/L — ABNORMAL HIGH (ref 96–112)
Creatinine, Ser: 0.48 mg/dL — ABNORMAL LOW (ref 0.50–1.10)
GFR calc Af Amer: >90 mL/min (ref >90)
GFR calc Af Amer: >90 mL/min (ref >90)
GFR calc non Af Amer: >90 mL/min (ref >90)
Glucose, Bld: 110 mg/dL — ABNORMAL HIGH (ref 70–99)
Glucose, Bld: 118 mg/dL — ABNORMAL HIGH (ref 70–99)
Total Bilirubin: 0.2 mg/dL — ABNORMAL LOW (ref 0.3–1.2)
Total Protein: 6.8 g/dL (ref 6.0–8.3)

## 2012-12-02 LAB — PROTIME-INR
INR: 1.11 (ref 0.00–1.49)
Prothrombin Time: 14.2 seconds (ref 11.6–15.2)

## 2012-12-02 LAB — CBC
Hemoglobin: 11.4 g/dL — ABNORMAL LOW (ref 12.0–15.0)
MCH: 31.2 pg (ref 26.0–34.0)
MCHC: 32.4 g/dL (ref 30.0–36.0)
Platelets: 174 10*3/uL (ref 150–400)
RBC: 3.65 MIL/uL — ABNORMAL LOW (ref 3.87–5.11)

## 2012-12-02 LAB — POCT I-STAT 3, ART BLOOD GAS (G3+)
pCO2 arterial: 45.8 mmHg — ABNORMAL HIGH (ref 35.0–45.0)
pH, Arterial: 7.302 — ABNORMAL LOW (ref 7.350–7.450)

## 2012-12-02 LAB — URINALYSIS, MICROSCOPIC ONLY
Specific Gravity, Urine: 1.014 (ref 1.005–1.030)
Urobilinogen, UA: 0.2 mg/dL (ref 0.0–1.0)

## 2012-12-02 LAB — TROPONIN I
Troponin I: <0.3 ng/mL (ref <0.30)
Troponin I: <0.3 ng/mL (ref <0.30)

## 2012-12-02 MED ORDER — ALBUTEROL SULFATE (5 MG/ML) 0.5% IN NEBU
2.5000 mg | INHALATION_SOLUTION | Freq: Four times a day (QID) | RESPIRATORY_TRACT | Status: DC
  Administered 2012-12-02 – 2012-12-06 (×17): 2.5 mg via RESPIRATORY_TRACT
  Filled 2012-12-02 (×17): qty 0.5

## 2012-12-02 MED ORDER — KCL-LACTATED RINGERS 20 MEQ/L IV SOLN
INTRAVENOUS | Status: DC
  Administered 2012-12-02: 125 mL/h via INTRAVENOUS
  Filled 2012-12-02 (×3): qty 1000

## 2012-12-02 MED ORDER — SODIUM CHLORIDE 0.9 % IV BOLUS (SEPSIS)
1000.0000 mL | Freq: Once | INTRAVENOUS | Status: AC
  Administered 2012-12-02: 1000 mL via INTRAVENOUS

## 2012-12-02 MED ORDER — FOLIC ACID 1 MG PO TABS
1.0000 mg | ORAL_TABLET | Freq: Every day | ORAL | Status: DC
  Administered 2012-12-02 – 2012-12-07 (×6): 1 mg via ORAL
  Filled 2012-12-02 (×7): qty 1

## 2012-12-02 MED ORDER — ADULT MULTIVITAMIN W/MINERALS CH
1.0000 | ORAL_TABLET | Freq: Every day | ORAL | Status: DC
  Administered 2012-12-02 – 2012-12-07 (×6): 1 via ORAL
  Filled 2012-12-02 (×6): qty 1

## 2012-12-02 MED ORDER — POTASSIUM CHLORIDE 10 MEQ/100ML IV SOLN
10.0000 meq | INTRAVENOUS | Status: AC
  Administered 2012-12-02 (×4): 10 meq via INTRAVENOUS
  Filled 2012-12-02 (×4): qty 100

## 2012-12-02 MED ORDER — LACTATED RINGERS IV BOLUS (SEPSIS)
1000.0000 mL | Freq: Once | INTRAVENOUS | Status: AC
  Administered 2012-12-02: 1000 mL via INTRAVENOUS

## 2012-12-02 MED ORDER — SODIUM BICARBONATE 8.4 % IV SOLN
INTRAVENOUS | Status: DC
  Administered 2012-12-02 – 2012-12-03 (×2): via INTRAVENOUS
  Filled 2012-12-02 (×4): qty 150

## 2012-12-02 MED ORDER — PANTOPRAZOLE SODIUM 40 MG IV SOLR
40.0000 mg | INTRAVENOUS | Status: DC
  Administered 2012-12-02 – 2012-12-03 (×2): 40 mg via INTRAVENOUS
  Filled 2012-12-02 (×3): qty 40

## 2012-12-02 MED ORDER — IPRATROPIUM BROMIDE 0.02 % IN SOLN
0.5000 mg | Freq: Four times a day (QID) | RESPIRATORY_TRACT | Status: DC
  Administered 2012-12-02 – 2012-12-06 (×17): 0.5 mg via RESPIRATORY_TRACT
  Filled 2012-12-02 (×17): qty 2.5

## 2012-12-02 MED ORDER — POTASSIUM CHLORIDE CRYS ER 20 MEQ PO TBCR
40.0000 meq | EXTENDED_RELEASE_TABLET | Freq: Once | ORAL | Status: AC
  Administered 2012-12-02: 40 meq via ORAL
  Filled 2012-12-02: qty 2

## 2012-12-02 MED ORDER — THIAMINE HCL 100 MG/ML IJ SOLN: 100.0000 mg | Freq: Every day | INTRAMUSCULAR | Status: DC

## 2012-12-02 MED ORDER — LACTULOSE 10 GM/15ML PO SOLN
30.0000 g | Freq: Two times a day (BID) | ORAL | Status: DC
  Administered 2012-12-02 – 2012-12-04 (×5): 30 g via ORAL
  Filled 2012-12-02 (×8): qty 45

## 2012-12-02 NOTE — Progress Notes (Addendum)
Name: [name] [name] MRN: [id number] DOB: 13-Sep-1951    LOS: 1  Referring Provider:  Dr. Arthor Captain Reason for Referral:  Hypotension  PULMONARY / CRITICAL CARE MEDICINE  HPI:  Patient is confused, unable to provide good history, history taken from chart and conversation with her daughter. This is a 62 y.o. female with past medical history of alcohol abuse, hypertension and major depression. Patient had recent admission to behavioral health hospital for suicidal attempt with cutting her wrist. Patient presents today to the emergency department because of confusion. The history was obtained primarily from the daughter, she said she had some confusion about 2 weeks ago and workup was done on Dr. Jeannetta Nap office, they did not call them with the blood work results. She started to have increasing confusion and lethargy since last night, today she can even stay awake. Upon initial evaluation in the emergency department patient is extremely lethargic but she opened her eyes her concentration span is probably about 5 seconds, CT scan of the head was negative, her blood alcohol level is <11, normal ammonia level, ABG showed normal PCO2 with mild acidosis. Her salicylate level was elevated at 34.9, apparently this is from a migraine medication that she takes frequently, she was told not to use it in the past but still taking it obviously. She has been developing some hypotension despite 2L IVF and dopamine drip has been started and she is responding to this.  Bedside echo done showing IVC with some variability, and both ventricles are hyperdynamic.  Interestingly, according to her daughter for the past 2 years the patient has had 2 sexual partners with unknown HIV status,. She is a current smoker, uses bronchodilator PRN, not on home O2. She used to dirnk EtOH a lot but has been sober for the past 30 days. No other drug abuse.   Past Medical History  Diagnosis Date  . Respiratory failure with hypoxia   .  Pneumonia   . Altered mental status     secondary to ethyl alcohol withdrawal  . Systemic inflammatory response syndrome   . Tobacco abuse   . Diastolic congestive heart failure   . Depression   . HTN (hypertension)   . ETOH abuse   . Suicidal ideation   . COPD (chronic obstructive pulmonary disease)   . Anxiety   . Shortness of breath   . Asthma   . Headache   . Arthritis   . Anemia    Past Surgical History  Procedure Laterality Date  . Cesarean section    . Tubal ligation    . Cholecystectomy    . Cystectomy      both ears  . Total abdominal hysterectomy     Prior to Admission medications   Medication Sig Start Date End Date Taking? Authorizing Provider  albuterol (PROAIR HFA) 108 (90 BASE) MCG/ACT inhaler Inhale 2 puffs into the lungs every 4 (four) hours as needed. : For Wheezing/shortness of breath 10/06/12  Yes Sanjuana Kava, NP  fish oil-omega-3 fatty acids 1000 MG capsule Take 1 g by mouth daily.   Yes Historical Provider, MD  lisinopril (PRINIVIL,ZESTRIL) 10 MG tablet Take 1 tablet (10 mg total) by mouth daily. For control of high blood pressure. 10/06/12  Yes Sanjuana Kava, NP  naltrexone (DEPADE) 50 MG tablet Take 25 mg by mouth at bedtime.   Yes Historical Provider, MD  traMADol (ULTRAM) 50 MG tablet Take 1 tablet (50 mg total) by mouth every 6 (six) hours as needed  for pain. 10/14/12  Yes Tiffany Irine Seal, PA-C  Vilazodone HCl (VIIBRYD) 40 MG TABS Take 40 mg by mouth daily.   Yes Historical Provider, MD   Allergies Allergies  Allergen Reactions  . Penicillins     unknown  . Codeine Itching and Rash    Family History Family History  Problem Relation Age of Onset  . Heart disease Father   . Lung cancer Father   . Lung cancer Mother   . Diabetes Father   . Asthma Father   . Alcoholism Father    Social History  reports that she has been smoking Cigarettes.  She has a 40 pack-year smoking history. She has never used smokeless tobacco. She reports that   drinks alcohol. She reports that she does not use illicit drugs.  Review Of Systems:   Denies fever or chills Denies weight changes Denies rash or pruritus Denies vision changes, Has been having migraine in the past Denies abd pain Denies diarrhea, denies N/V Denies dysuria Has chronic cough with occasional sputum Denies joint swelling   Brief patient description:  37F with previous EtOH but currently sober, has been having increasing confusion for the past 2 weeks, with workup as outpatient with unclear results. High salicylates, normal ammonia, normal PCO2. Hypotensive on dopamine drip. Low likelihood of infection for now. Also likely COPD-er.   Vital Signs: Temp:  [98 F (36.7 C)-98.5 F (36.9 C)] 98.5 F (36.9 C) (03/07 2323) Pulse Rate:  [76-144] 111 (03/07 2350) Resp:  [12-24] 20 (03/07 2350) BP: (51-123)/(13-90) 99/84 mmHg (03/07 2350) SpO2:  [90 %-100 %] 94 % (03/07 2350) Weight:  [84.7 kg (186 lb 11.7 oz)] 84.7 kg (186 lb 11.7 oz) (03/07 1939)  Physical Examination: in mild distress at rest but able to lie down and sleep General:  Awake, alert, oriented to people and place. Not oriented to time and date, confused and do not know why she is in the hospital Neuro:  PERRLA, EOM full and normal, cranial nerves unremarkable, muscle strength 5/5 all ext, no numbness. HEENT:  Atraumatic, no rash Neck:  Trachea midline, no LN Cardiovascular:  S1S2 reg Lungs:  Decreased breath sounds bilaterally with mild expiratory wheeze Abdomen:  Soft, nontender, no guarding Musculoskeletal:  No edema Skin:  No rash  Principal Problem:   Lethargy Active Problems:   TOBACCO USE   HYPERTENSION   Alcohol dependence   Major depression   Salicylate overdose   ASSESSMENT AND PLAN Principal Problem:  Lethargy + hypotension Active Problems:  TOBACCO USE, likely COPD History of Alcohol dependence  Major depression  Salicylate overdose   Lethargy -Unclear etiology for now,  salicylate overdose may cause altered mental status, although usually significant salicylate toxicity comes with high lactate (which she does NOT have) -clear CT scan, normal ammonia and PCO2 levels.  -will send for other workup for delirium/confusion: HIV, Hepatitis, heavy metal screen, RPR  Hypotension -Most likely from volume depletion, both ventricles are hyperdynamic and IVC has respiratory variability -Will give more IVF on top of dopamine gtt. Will give some bicarb-containing IVF (LRS) to avoid causing reexpansion acidosis on top of salicylate acidosis -Less likely infection with normal UA and CXR not very impressive, plus her procalcitonin negative -Patient is on tramadol and naltrexone, naloxone given in the emergency department with minimal effect.  -empirically on Vanco + imipenem. Will consider stopping this if her procalcitonin in AM is negative again. Salicylate overdose, mild  -The salicylate level came back at 34.9 and coming down  in high 20's now -This might explain the mild acidosis on the ABG. -EDP contacted the [facility], recommended fluids at this point, target urine output 2 mL/Kg/day  -Coingestion is common however at this point no evidence of other substances -Because of recent suicidal attempt, consider psych eval for suicidal ideation/attempt when she is more alert.  History of Alcohol abuse  -Per her daughter she is going to [facility], she's been sober for the past 30 days.  -She is on naltrexone 25 mg by mouth each bedtime at night.  -Naltrexone can cause dizziness and somnolence. Hold ultram  Major depression  -With recent history of suicidal attempt/ideation and admission to behavioral health hospital in January of 2014.  -We'll hold her antidepressant for now, restart when clinically appropriate.  Code Status: Full code  Family Communication: Plan discussed with her daughter  Disposition Plan: Stepdown, for lethargy, expected length of stay more  then 2 midnight   Likely COPD, poorly controlled but seems to be NOT in acute exacerbation Duoneb scheduled Q6 for now Critical care Time: 45 minutes   BEST PRACTICE / DISPOSITION Level of Care; critical Primary Service:  PCCM Code Status:  Full Diet:  NPO for now DVT Px:  heparin GI Px: protonix   Wadie Lessen, M.D. Pulmonary and Critical Care Medicine [facility] Pager: [phone]  12/02/2012, 12:41 AM

## 2012-12-02 NOTE — Progress Notes (Signed)
Name: [name] [name] MRN: [id number] DOB: 02-12-1951    LOS: 1  Referring Provider:  Dr. Arthor Captain Reason for Referral:  Hypotension  PULMONARY / CRITICAL CARE MEDICINE  Brief patient description:  63 F with previous EtOH but currently sober, has been having increasing confusion for the past 2 weeks, with workup as outpatient with unclear results. High salicylates, normal ammonia, normal PCO2. Hypotensive on dopamine drip. Low likelihood of infection for now. Also likely COPD-er.  LINES / TUBES  CULTURES  ABX Vanco 3/7>>>3/8 Imipenem 3/7>>>3/8  KEY EVENTS / STUDIES 12-26-2022- remains on pressors 2022-12-26- Abdo [place] - Nodular hepatic contour, suggesting cirrhosis.  Status post cholecystectomy.  Common duct measures 11 mm, likely postsurgical.   SUBJECTIVE: Family report patients mental status much improved but still somewhat confused   Recent Labs Lab 12/01/12 1612 December 25, 2012 0212  HGB 11.9* 11.4*  HCT 36.0 35.2*  WBC 7.3 9.5  PLT 166 174    Recent Labs Lab 12/01/12 1612 12/01/12 2217 12/01/12 2330 Dec 25, 2012 0212 2012-12-25 0620  NA 145 143 144 142 144  K 3.3* 3.1* 3.3* 3.7 3.7  CL 111 114* 115* 113* 114*  CO2 21 19 20 21 21   GLUCOSE 108* 106* 110* 118* 105*  BUN 24* 20 17 14 10   CREATININE 0.74 0.57 0.57 0.48* 0.44*  CALCIUM 8.3* 7.5* 7.5* 7.6* 7.9*     Vital Signs: Temp:  [97.9 F (36.6 C)-98.5 F (36.9 C)] 97.9 F (36.6 C) December 26, 2022 0345) Pulse Rate:  [76-144] 110 12-26-22 0615) Resp:  [11-24] 23 12-26-2022 0900) BP: (51-136)/(13-90) 125/75 mmHg Dec 26, 2022 0900) SpO2:  [71 %-100 %] 100 % 12/26/2022 0615) Arterial Line BP: (124-163)/(61-94) 136/66 mmHg 12/26/2022 0900) Weight:  [186 lb 11.7 oz (84.7 kg)] 186 lb 11.7 oz (84.7 kg) (03/07 1939)  Physical Examination: in mild distress at rest but able to lie down and sleep General:  Arouses to voice, alert, oriented to people and place. Not oriented to time and date, mild confusion Neuro:  PERRLA, EOM full and normal, cranial nerves  unremarkable, muscle strength 5/5 all ext, no numbness. HEENT:  Atraumatic, no rash Neck:  Trachea midline, no LN Cardiovascular:  S1S2 reg Lungs:  Decreased breath sounds bilaterally with mild expiratory wheeze Abdomen:  Soft, nontender, no guarding Musculoskeletal:  No edema Skin:  No rash  Principal Problem:   Lethargy Active Problems:   TOBACCO USE   HYPERTENSION   Alcohol dependence   Major depression   Salicylate overdose   ASSESSMENT AND PLAN Principal Problem:  Lethargy + hypotension Active Problems:  TOBACCO USE, likely COPD History of Alcohol dependence  Major depression  Salicylate overdose   Lethargy -Unclear etiology but most likely polypharmacy, salicylate overdose may cause altered mental status, although usually significant salicylate toxicity comes with high lactate (which she does NOT have).   CT scan, normal ammonia and PCO2 levels. Concern for correlation of new medications started approx 3 weeks ago (nalxtrexone & change in anti-depressant) Hepatic enceph contribution  Plan: -follow results of HIV, Hepatitis, heavy metal screen, RPR -supportive care -hold sedating medications -lactulose addition -correct metabolic demands -at risk W-K syndrome, may need MRi brain -add IV thiamine  Hypotension -Most likely from volume depletion, both ventricles are hyperdynamic and IVC has respiratory variability.  Less likely infection with normal UA and CXR not very impressive, negative procalcitonin.  Lactic wnl, runs low at basline  Plan: -IVF on top of dopamine gtt., to MAP 50, sys 90, lactic re assuring -wean dopamine to off, assess cuff  on other ext as well -d/c abx, low clinical suspicion for infection -cortisol, TSh  Salicylate overdose, mild  - initial salicylate level 34.9 and now resolved. EDP contacted the [facility], recommended fluids at this point, target urine output 2 mL/Kg/day.  Hx of recent suicide attempt.   Plan: -Consider psych  evaluation to weigh in on psy regimen / effects. -d/c q3 hour salicylate levels, trending down -change BMP's to Q12   History of Alcohol abuse -Per her daughter she is going to [facility], she's been sober for the past 30 days. She is on naltrexone 25 mg by mouth each bedtime at night with reduced urge to drink.  Naltrexone can cause dizziness and somnolence  Plan:  -hold Naltrexone, ultram  [place] with cirhosis, add lactulose Thimaine, folic, mv addition  Major depression -With recent history of suicidal attempt/ideation and admission to behavioral health hospital in January of 2014.   Plan: -hold antidepressant for now  Likely COPD, poorly controlled but seems to be NOT in acute exacerbation  Plan: -Duoneb scheduled Q6    Hypokalemia  Plan: -replace, f/u BMP     Canary Brim, NP-C [facility] Pgr: [phone] or [phone]    12/02/2012, 9:41 AM  Ccm time 40 min  I have fully examined this patient and agree with above findings.      Mcarthur Rossetti. Tyson Alias, MD, FACP Pgr: [phone]  Pulmonary & Critical Care

## 2012-12-02 NOTE — Progress Notes (Signed)
CARE MANAGEMENT NOTE 12/02/2012  Patient:  [name] [name], [name] [name]   Account Number:  [id number]  Date Initiated:  12/02/2012  Documentation initiated by:  Vance Peper  Subjective/Objective Assessment:   62 yr old female admitted for altered mentation     Action/Plan:   CM will follow for [facility] needs if any.   Anticipated DC Date:     Anticipated DC Plan:           Choice offered to / List presented to:             Status of service:  In process, will continue to follow Medicare Important Message given?   (If response is "NO", the following Medicare IM given date fields will be blank) Date Medicare IM given:   Date Additional Medicare IM given:    Discharge Disposition:    Per UR Regulation:    If discussed at Long Length of Stay Meetings, dates discussed:    Comments:

## 2012-12-02 NOTE — Progress Notes (Signed)
Pt said she wanted me to pray for her. She said she wanted to stop drinking and has been following the 12-step program. Provided empathetic listening and prayer for pt. Marjory Lies Chaplain

## 2012-12-02 NOTE — Plan of Care (Signed)
Problem: Phase I Progression Outcomes Goal: OOB as tolerated unless otherwise ordered Outcome: Not Progressing lethargic Goal: Voiding-avoid urinary catheter unless indicated Outcome: Not Progressing Foley in place for uop monitoring Goal: Hemodynamically stable Outcome: Not Progressing On dopamine gtt Goal: Other Phase I Outcomes/Goals Outcome: Completed/Met Date Met:  12/02/12 Salicylate level = wnl

## 2012-12-02 NOTE — Progress Notes (Signed)
[name] [name], @ Poison Control notified of last 2 salicylate levels (18 and 11 ,respectively) .UOP , VS  And Pt's mentation also reviewed . Salicylate levels to be discontinued by DR  (sitting beside nurse)as [name] [name] recommended . Marland Kitchen

## 2012-12-03 DIAGNOSIS — Z5189 Encounter for other specified aftercare: Secondary | ICD-10-CM

## 2012-12-03 LAB — MAGNESIUM: Magnesium: 1.8 mg/dL (ref 1.5–2.5)

## 2012-12-03 LAB — HIV ANTIBODY (ROUTINE TESTING W REFLEX): HIV: NONREACTIVE

## 2012-12-03 LAB — CBC
HCT: 32.1 % — ABNORMAL LOW (ref 36.0–46.0)
MCH: 31.4 pg (ref 26.0–34.0)
MCHC: 32.4 g/dL (ref 30.0–36.0)
RDW: 15.8 % — ABNORMAL HIGH (ref 11.5–15.5)

## 2012-12-03 LAB — URINE CULTURE: Colony Count: >=100000

## 2012-12-03 LAB — PHOSPHORUS: Phosphorus: 1.5 mg/dL — ABNORMAL LOW (ref 2.3–4.6)

## 2012-12-03 LAB — HEPATITIS B SURFACE ANTIGEN: Hepatitis B Surface Ag: NEGATIVE

## 2012-12-03 LAB — BASIC METABOLIC PANEL
Calcium: 7.9 mg/dL — ABNORMAL LOW (ref 8.4–10.5)
Creatinine, Ser: 0.32 mg/dL — ABNORMAL LOW (ref 0.50–1.10)
GFR calc non Af Amer: >90 mL/min (ref >90)
Glucose, Bld: 128 mg/dL — ABNORMAL HIGH (ref 70–99)
Sodium: 137 mEq/L (ref 135–145)

## 2012-12-03 LAB — HEPATITIS C ANTIBODY: HCV Ab: NEGATIVE

## 2012-12-03 LAB — RPR: RPR Ser Ql: NONREACTIVE

## 2012-12-03 MED ORDER — SODIUM CHLORIDE 0.9 % IV SOLN
INTRAVENOUS | Status: DC
  Administered 2012-12-03: 20:00:00 via INTRAVENOUS

## 2012-12-03 MED ORDER — POTASSIUM CHLORIDE CRYS ER 20 MEQ PO TBCR
40.0000 meq | EXTENDED_RELEASE_TABLET | Freq: Once | ORAL | Status: AC
  Administered 2012-12-03: 40 meq via ORAL
  Filled 2012-12-03: qty 1

## 2012-12-03 MED ORDER — LEVOFLOXACIN IN D5W 750 MG/150ML IV SOLN
750.0000 mg | INTRAVENOUS | Status: DC
  Administered 2012-12-03: 750 mg via INTRAVENOUS
  Filled 2012-12-03 (×2): qty 150

## 2012-12-03 MED ORDER — VITAMIN B-1 100 MG PO TABS
100.0000 mg | ORAL_TABLET | Freq: Every day | ORAL | Status: DC
  Administered 2012-12-03 – 2012-12-07 (×5): 100 mg via ORAL
  Filled 2012-12-03 (×5): qty 1

## 2012-12-03 NOTE — Progress Notes (Addendum)
[facility] Progress Note [place] TEAM 1 - Stepdown/ICU TEAM   ERNA BROSSARD GNF:[id number] DOB: 09-29-1950 DOA: 12/01/2012 PCP: Buren Kos, MD  Brief narrative: 62 y.o. female with history of alcohol abuse, hypertension and major depression. Patient had recent admission to behavioral health hospital for suicidal attempt with cutting her wrist. Patient presented to the emergency department because of confusion. The history was obtained primarily from the daughter who said the pt had some confusion about 2 weeks prior to her admit and a workup was done at Dr. Jeannetta Nap office, but they never heard about the results. She started to have increasing confusion and lethargy, progressing to the point the family could not keep her awake. Upon initial evaluation in the emergency department patient was extremely lethargic but opened her eyes - her concentration span was about 5 seconds.  CT scan of the head was negative, her blood alcohol level was <11, normal ammonia level, ABG showed normal PCO2 with mild acidosis.    Assessment/Plan:  Lethargy / toxic metabolic encephalopahty Unclear etiology but most likely polypharmacy - new medications started approx 3 weeks ago (nalxtrexone & change in anti-depressant) - markedly improved today/approaching baseline  Salicylate OD - mild initial salicylate level 34.9 and now resolved - treated with bicarbonate hydration alone  Hypotension Required dopamine support - likely simple volume depletion - resolved with volume expansion  Hx of EtOH abuse Per her daughter she is going to [facility], she's been sober for the past 30 days - no evidence of withdrawal at this time  Major depression w/ hx of suicide attempts Denies suicidal ideation  Positive UA/urinary tract infection Initiate empiric antibiotic therapy and follow culture data  Tobacco abuse w/ probable COPD Well compensated at present  MRSA screen positive Following usual contact  precautions  Code Status: FULL   Family Communication: no family present at time of exam Disposition Plan: tele bed transfer  Consultants: PCCM  Procedures: none  Antibiotics: Vanco 3/7>>>3/8  Imipenem 3/7>>>3/8 Levaquin 3/9 >>  DVT prophylaxis: lovenox  HPI/Subjective: The patient is resting comfortably in bed at the time of my visit.  She denies fevers chills shortness of breath nausea or vomiting.  She recalls that she was confused but cannot provide any further details.  She denies feelings of hopelessness or severe depression.  Objective: Blood pressure 142/80, pulse 90, temperature 98.3 F (36.8 C), temperature source Oral, resp. rate 16, height 5\' 2"  (1.575 m), weight 88.5 kg (195 lb 1.7 oz), SpO2 95.00%.  Intake/Output Summary (Last 24 hours) at 12/03/12 0958 Last data filed at 12/03/12 0800  Gross per 24 hour  Intake 3621.89 ml  Output   1550 ml  Net 2071.89 ml   Exam: General: No acute respiratory distress Lungs: Clear to auscultation bilaterally without wheezes or crackles Cardiovascular: Regular rate and rhythm without murmur gallop or rub normal S1 and S2 Abdomen: Nontender, nondistended, soft, bowel sounds positive, no rebound, no ascites, no appreciable mass Extremities: No significant cyanosis, clubbing, or edema bilateral lower extremities  Data Reviewed: Basic Metabolic Panel:  Recent Labs Lab 12/01/12 2330 12/02/12 0212 12/02/12 0620 12/02/12 0935 12/02/12 2110 12/03/12 0400  NA 144 142 144 144 141  --   K 3.3* 3.7 3.7 3.3* 3.8  --   CL 115* 113* 114* 116* 109  --   CO2 20 21 21  18* 27  --   GLUCOSE 110* 118* 105* 87 110*  --   BUN 17 14 10 7 8   --  CREATININE 0.57 0.48* 0.44* 0.34* 0.40*  --   CALCIUM 7.5* 7.6* 7.9* 6.5* 8.1*  --   MG  --   --   --   --   --  1.8  PHOS  --   --   --   --   --  1.5*   Liver Function Tests:  Recent Labs Lab 12/01/12 1612 12/01/12 2330 12/02/12 0212  AST 41* 39* 40*  ALT 26 23 23   ALKPHOS 48 43  47  BILITOT 0.1* 0.1* 0.2*  PROT 7.1 6.8 7.0  ALBUMIN 3.2* 3.0* 3.0*    Recent Labs Lab 12/01/12 1607 12/01/12 2331  AMMONIA 34 23   CBC:  Recent Labs Lab 12/01/12 1612 12/02/12 0212 12/03/12 0400  WBC 7.3 9.5 6.3  NEUTROABS 4.4  --   --   HGB 11.9* 11.4* 10.4*  HCT 36.0 35.2* 32.1*  MCV 96.8 96.4 97.0  PLT 166 174 153   Cardiac Enzymes:  Recent Labs Lab 12/01/12 1612 12/01/12 2053 12/01/12 2331 12/02/12 0620  TROPONINI <0.30 <0.30 <0.30 <0.30   CBG:  Recent Labs Lab 12/01/12 1618  GLUCAP 102*    Recent Results (from the past 240 hour(s))  URINE CULTURE     Status: None   Collection Time    12/01/12  6:01 PM      Result Value Range Status   Specimen Description URINE, CLEAN CATCH   Final   Special Requests NONE   Final   Culture  Setup Time 12/01/2012 20:29   Final   Colony Count >=100,000 COLONIES/ML   Final   Culture GRAM NEGATIVE RODS   Final   Report Status PENDING   Incomplete  MRSA PCR SCREENING     Status: Abnormal   Collection Time    12/01/12  7:02 PM      Result Value Range Status   MRSA by PCR POSITIVE (*) NEGATIVE Final   Comment:            The GeneXpert MRSA Assay (FDA     approved for NASAL specimens     only), is one component of a     comprehensive MRSA colonization     surveillance program. It is not     intended to diagnose MRSA     infection nor to guide or     monitor treatment for     MRSA infections.     RESULT CALLED TO, READ BACK BY AND VERIFIED WITH:     Mikki Santee RN [id number] 12/01/12 A BROWNING     Studies:  Recent x-ray studies have been reviewed in detail by the Attending Physician  Scheduled Meds:  Scheduled Meds: . ipratropium  0.5 mg Nebulization Q6H   And  . albuterol  2.5 mg Nebulization Q6H  . Chlorhexidine Gluconate Cloth  6 each Topical Q0600  . folic acid  1 mg Oral Daily  . heparin  5,000 Units Subcutaneous Q8H  . lactulose  30 g Oral BID  . multivitamin with minerals  1 tablet Oral Daily  .  mupirocin ointment  1 application Nasal BID  . pantoprazole (PROTONIX) IV  40 mg Intravenous Q24H  . sodium chloride  3 mL Intravenous Q12H  . thiamine  100 mg Intravenous Daily   Continuous Infusions: . DOPamine Stopped (12/02/12 2230)  .  sodium bicarbonate  infusion 1000 mL 75 mL/hr at 12/03/12 0052    Time spent on care of this patient: 35   MCCLUNG,JEFFREY T  [facility] Office  [phone] Pager - Text Page per Loretha Stapler as per below:  On-Call/Text Page:      [email or web address]      password [email or web address]  If 7PM-7AM, please contact night-coverage [email or web address] Password [email or web address] 12/03/2012, 9:58 AM   LOS: 2 days

## 2012-12-04 DIAGNOSIS — F17200 Nicotine dependence, unspecified, uncomplicated: Secondary | ICD-10-CM

## 2012-12-04 LAB — COMPREHENSIVE METABOLIC PANEL
Albumin: 2.6 g/dL — ABNORMAL LOW (ref 3.5–5.2)
BUN: <3 mg/dL — ABNORMAL LOW (ref 6–23)
Calcium: 8.6 mg/dL (ref 8.4–10.5)
Creatinine, Ser: 0.49 mg/dL — ABNORMAL LOW (ref 0.50–1.10)
GFR calc Af Amer: >90 mL/min (ref >90)
Glucose, Bld: 93 mg/dL (ref 70–99)
Potassium: 3.8 mEq/L (ref 3.5–5.1)
Total Protein: 6.3 g/dL (ref 6.0–8.3)

## 2012-12-04 LAB — SALICYLATE LEVEL: Salicylate Lvl: <2 mg/dL — ABNORMAL LOW (ref 2.8–20.0)

## 2012-12-04 LAB — CORTISOL: Cortisol, Plasma: 5.2 ug/dL

## 2012-12-04 LAB — TSH: TSH: 0.112 u[IU]/mL — ABNORMAL LOW (ref 0.350–4.500)

## 2012-12-04 LAB — CBC
HCT: 34.8 % — ABNORMAL LOW (ref 36.0–46.0)
Hemoglobin: 11.1 g/dL — ABNORMAL LOW (ref 12.0–15.0)
MCH: 31.4 pg (ref 26.0–34.0)
MCHC: 31.9 g/dL (ref 30.0–36.0)

## 2012-12-04 MED ORDER — LEVOFLOXACIN 750 MG PO TABS
750.0000 mg | ORAL_TABLET | Freq: Every day | ORAL | Status: DC
  Administered 2012-12-04 – 2012-12-07 (×4): 750 mg via ORAL
  Filled 2012-12-04 (×4): qty 1

## 2012-12-04 MED ORDER — COSYNTROPIN 0.25 MG IJ SOLR
0.2500 mg | Freq: Once | INTRAMUSCULAR | Status: AC
  Administered 2012-12-05: 0.25 mg via INTRAVENOUS
  Filled 2012-12-04 (×2): qty 0.25

## 2012-12-04 NOTE — Progress Notes (Signed)
[facility] Progress Note [place] TEAM 1 - Stepdown/ICU TEAM   [name] [name] ZOX:[id number] DOB: December 18, 1950 DOA: 12/01/2012 PCP: Buren Kos, MD  Brief narrative: 62 y.o. female with history of alcohol abuse, hypertension and major depression. Patient had recent admission to behavioral health hospital for suicidal attempt with cutting her wrist. Patient presented to the emergency department because of confusion. The history was obtained primarily from the daughter who said the pt had some confusion about 2 weeks prior to her admit and a workup was done at Dr. Jeannetta Nap office, but they never heard about the results. She started to have increasing confusion and lethargy, progressing to the point the family could not keep her awake. Upon initial evaluation in the emergency department patient was extremely lethargic but opened her eyes - her concentration span was about 5 seconds.  CT scan of the head was negative, her blood alcohol level was <11, normal ammonia level, ABG showed normal PCO2 with mild acidosis.    Assessment/Plan:  Lethargy / toxic metabolic encephalopahty Unclear etiology but most likely polypharmacy - new medications started approx 3 weeks ago (nalxtrexone & change in anti-depressant) - markedly improved today/approaching baseline  Salicylate OD - mild initial salicylate level 34.9 and now resolved - treated with bicarbonate hydration alone - confirmation salicylate level accomplished today  Hypotension Required dopamine support - was originally felt to likely be due to simple volume depletion - has now recurred despite volume resuscitation - cortisol level noted to be "inappropriately normal" in the setting of severe acute distress/hypotension - ACTH stim test ordered  Suppressed TSH Free T4 ordered  Hypokalemia Resolved w/ replacement   Hx of EtOH abuse Per her daughter she is going to [facility], she's been sober for the past 30 days - no evidence of  withdrawal at this time  Major depression w/ hx of suicide attempts Denies suicidal ideation  Kleb pneumoniae urinary tract infection Sensitive to all meds but macrobid - transition to oral levaquin today   Tobacco abuse w/ probable COPD Well compensated at present  MRSA screen positive Following usual contact precautions  Code Status: FULL   Family Communication: no family present at time of exam Disposition Plan: tele bed transfer - followup free T4 and ACTH stim test  Consultants: PCCM  Procedures: none  Antibiotics: Vanco 3/7>>>3/8  Imipenem 3/7>>>3/8 Levaquin 3/9 >>  DVT prophylaxis: lovenox  HPI/Subjective: The patient is anxious to get out of bed today.  She denies fevers chills nausea vomiting or shortness of breath.  She is somewhat lethargic when I enter the room but does wake up as I continue to speak with her.  Her blood pressure dips into the 90s systolic as I am evaluating her.  Objective: Blood pressure 111/66, pulse 84, temperature 97.9 F (36.6 C), temperature source Oral, resp. rate 21, height 5\' 2"  (1.575 m), weight 88.5 kg (195 lb 1.7 oz), SpO2 97.00%.  Intake/Output Summary (Last 24 hours) at 12/04/12 1137 Last data filed at 12/04/12 0600  Gross per 24 hour  Intake 2083.33 ml  Output   3150 ml  Net -1066.67 ml   Exam: General: No acute respiratory distress Lungs: Clear to auscultation bilaterally without wheezes or crackles Cardiovascular: Regular rate and rhythm without murmur gallop or rub Abdomen: Nontender, nondistended, soft, bowel sounds positive, no rebound, no ascites, no appreciable mass Extremities: No significant cyanosis, clubbing, or edema bilateral lower extremities  Data Reviewed: Basic Metabolic Panel:  Recent Labs Lab 12/02/12 0620 12/02/12 0935 12/02/12 2110  12/03/12 0400 12/03/12 1200 12/04/12 0540  NA 144 144 141  --  137 142  K 3.7 3.3* 3.8  --  2.9* 3.8  CL 114* 116* 109  --  104 106  CO2 21 18* 27  --  28  32  GLUCOSE 105* 87 110*  --  128* 93  BUN 10 7 8   --  3* <3*  CREATININE 0.44* 0.34* 0.40*  --  0.32* 0.49*  CALCIUM 7.9* 6.5* 8.1*  --  7.9* 8.6  MG  --   --   --  1.8  --   --   PHOS  --   --   --  1.5*  --  1.7*   Liver Function Tests:  Recent Labs Lab 12/01/12 1612 12/01/12 2330 12/02/12 0212 12/04/12 0540  AST 41* 39* 40* 32  ALT 26 23 23 21   ALKPHOS 48 43 47 49  BILITOT 0.1* 0.1* 0.2* 0.3  PROT 7.1 6.8 7.0 6.3  ALBUMIN 3.2* 3.0* 3.0* 2.6*    Recent Labs Lab 12/01/12 1607 12/01/12 2331  AMMONIA 34 23   CBC:  Recent Labs Lab 12/01/12 1612 12/02/12 0212 12/03/12 0400 12/04/12 0540  WBC 7.3 9.5 6.3 7.2  NEUTROABS 4.4  --   --   --   HGB 11.9* 11.4* 10.4* 11.1*  HCT 36.0 35.2* 32.1* 34.8*  MCV 96.8 96.4 97.0 98.3  PLT 166 174 153 171   Cardiac Enzymes:  Recent Labs Lab 12/01/12 1612 12/01/12 2053 12/01/12 2331 12/02/12 0620  TROPONINI <0.30 <0.30 <0.30 <0.30   CBG:  Recent Labs Lab 12/01/12 1618  GLUCAP 102*    Recent Results (from the past 240 hour(s))  URINE CULTURE     Status: None   Collection Time    12/01/12  6:01 PM      Result Value Range Status   Specimen Description URINE, CLEAN CATCH   Final   Special Requests NONE   Final   Culture  Setup Time 12/01/2012 20:29   Final   Colony Count >=100,000 COLONIES/ML   Final   Culture KLEBSIELLA PNEUMONIAE   Final   Report Status 12/03/2012 FINAL   Final   Organism ID, Bacteria KLEBSIELLA PNEUMONIAE   Final  MRSA PCR SCREENING     Status: Abnormal   Collection Time    12/01/12  7:02 PM      Result Value Range Status   MRSA by PCR POSITIVE (*) NEGATIVE Final   Comment:            The GeneXpert MRSA Assay (FDA     approved for NASAL specimens     only), is one component of a     comprehensive MRSA colonization     surveillance program. It is not     intended to diagnose MRSA     infection nor to guide or     monitor treatment for     MRSA infections.     RESULT CALLED TO, READ  BACK BY AND VERIFIED WITH:     Mikki Santee RN [id number] 12/01/12 A BROWNING  CULTURE, BLOOD (ROUTINE X 2)     Status: None   Collection Time    12/01/12  8:15 PM      Result Value Range Status   Specimen Description BLOOD ARM RIGHT   Final   Special Requests BOTTLES DRAWN AEROBIC AND ANAEROBIC 10CC   Final   Culture  Setup Time 12/02/2012 00:27   Final   Culture  Final   Value:        BLOOD CULTURE RECEIVED NO GROWTH TO DATE CULTURE WILL BE HELD FOR 5 DAYS BEFORE ISSUING A FINAL NEGATIVE REPORT   Report Status PENDING   Incomplete  CULTURE, BLOOD (ROUTINE X 2)     Status: None   Collection Time    12/01/12  8:30 PM      Result Value Range Status   Specimen Description BLOOD ARM RIGHT   Final   Special Requests BOTTLES DRAWN AEROBIC AND ANAEROBIC 10CC   Final   Culture  Setup Time 12/02/2012 00:27   Final   Culture     Final   Value:        BLOOD CULTURE RECEIVED NO GROWTH TO DATE CULTURE WILL BE HELD FOR 5 DAYS BEFORE ISSUING A FINAL NEGATIVE REPORT   Report Status PENDING   Incomplete     Studies:  Recent x-ray studies have been reviewed in detail by the Attending Physician  Scheduled Meds:  Scheduled Meds: . ipratropium  0.5 mg Nebulization Q6H   And  . albuterol  2.5 mg Nebulization Q6H  . Chlorhexidine Gluconate Cloth  6 each Topical Q0600  . folic acid  1 mg Oral Daily  . heparin  5,000 Units Subcutaneous Q8H  . lactulose  30 g Oral BID  . levofloxacin (LEVAQUIN) IV  750 mg Intravenous Q24H  . multivitamin with minerals  1 tablet Oral Daily  . mupirocin ointment  1 application Nasal BID  . thiamine  100 mg Oral Daily   Continuous Infusions: . sodium chloride 50 mL/hr at 12/03/12 2008    Time spent on care of this patient: 30   [facility] T  [facility] Office  [phone] Pager - Text Page per Loretha Stapler as per below:  On-Call/Text Page:      [email or web address]      password [email or web address]  If 7PM-7AM, please contact night-coverage [email or web address] Password  [facility] 12/04/2012, 11:37 AM   LOS: 3 days

## 2012-12-04 NOTE — Progress Notes (Signed)
Pt transferred to RM [id number]   .BP 111/61  Pulse 87  Temp(Src) 100.1 F (37.8 C) (Oral)  Resp 18  Ht 5\' 2"  (1.575 m)  Wt 88.5 kg (195 lb 1.7 oz)  BMI 35.68 kg/m2  SpO2 97%   Pt c/o headache, Tylenol 650 mg given. Pt is alert and oriented x4. Pt is not in  resp distress and  no skin issues noted. Pt oriented to the room and unit. Call bell within reach, bed lower, and locked. Will continue to monitor pt.

## 2012-12-05 ENCOUNTER — Inpatient Hospital Stay (HOSPITAL_COMMUNITY): Payer: Medicaid Other

## 2012-12-05 DIAGNOSIS — T39094A Poisoning by salicylates, undetermined, initial encounter: Secondary | ICD-10-CM

## 2012-12-05 DIAGNOSIS — T39091A Poisoning by salicylates, accidental (unintentional), initial encounter: Secondary | ICD-10-CM

## 2012-12-05 LAB — HEPATITIS B CORE ANTIBODY, IGM: Hep B C IgM: NEGATIVE

## 2012-12-05 LAB — T4, FREE: Free T4: 0.97 ng/dL (ref 0.80–1.80)

## 2012-12-05 NOTE — Progress Notes (Signed)
[facility] Progress Note [place] TEAM 1 - Stepdown/ICU TEAM   CRISTINE DAW UEA:[id number] DOB: 05-Apr-1951 DOA: 12/01/2012 PCP: Buren Kos, MD  Brief narrative: 62 y.o. female with history of alcohol abuse, hypertension and major depression. Patient had recent admission to behavioral health hospital for suicidal attempt with cutting her wrist. Patient presented to the emergency department because of confusion. The history was obtained primarily from the daughter who said the pt had some confusion about 2 weeks prior to her admit and a workup was done at Dr. Jeannetta Nap office, but they never heard about the results. She started to have increasing confusion and lethargy, progressing to the point the family could not keep her awake. Upon initial evaluation in the emergency department patient was extremely lethargic but opened her eyes - her concentration span was about 5 seconds.  CT scan of the head was negative, her blood alcohol level was <11, normal ammonia level, ABG showed normal PCO2 with mild acidosis.    Assessment/Plan:  Lethargy / toxic metabolic encephalopahty Unclear etiology but most likely polypharmacy - new medications started approx 3 weeks ago (nalxtrexone & change in anti-depressant), infection, UTI.  RPR negative, HIV negative, UDS negative. CT head negative.  Improving. Will ask Psych to help with medications for depression and alcohol use.  PT, OT consult.   Salicylate OD - mild initial salicylate level 34.9 and now resolved - treated with bicarbonate hydration. - confirmation salicylate level decrease to less than 2. Patient was taking up to 6 BC powder.   Hypotension Required dopamine support - was originally felt to likely be due to simple volume depletion - has now recurred despite volume resuscitation - cortisol level noted to be "inappropriately normal" in the setting of severe acute distress/hypotension - ACTH stim test pending. Blood pressure increasing.   Cortisol was  at 5.2.   Suppressed TSH Free T4 , T 3 pending result.   Hypokalemia Resolved w/ replacement. B-met am.   Hx of EtOH abuse Per her daughter she is going to [facility], she's been sober for the past 30 days - no evidence of withdrawal at this time. Since patient was started on Naltrexon daughter has notice change in mental status. Psych consulted to help with medications.   Major depression w/ hx of suicide attempts Denies suicidal ideation. Will ask Psych consult to help with medication. Her encephalopathy was thought to be multifactorial medications, infection, increase salicylates.  Kleb pneumoniae urinary tract infection  Klebsiella pan-sensitive. levaquin Day 2.   Tobacco abuse w/ probable COPD Wheezing lung exam. Nebulizer treatments, albuterol and ipratropium.  Diarrhea: likely secondary to lactulose. Will stop lactulose. Will check for C diff patient on antibiotics.  Right side rib pain: check x ray.      Code Status: FULL   Family Communication: Daughter at bedside, care discussed with her.  Disposition Plan: - followup free T4 and ACTH stim test, psych consult.   Consultants: PCCM  Procedures: none  Antibiotics: Vanco 3/7>>>3/8  Imipenem 3/7>>>3/8 Levaquin 3/9 >>  DVT prophylaxis: lovenox  HPI/Subjective: Patient awake, relates diarrhea, think is from lactulose. She doesn't take lactulose at home. Per daughter who was at bedside patient confusion started after patient was started on Naltrexon. She think Naltrexon has help with alcohol craving. Patient more awake but still not at baseline per daughter. Patient with living dreams.  Patient complaining of right side rib pain, daughter think she could fell at home.   Objective: Blood pressure 154/65, pulse 77, temperature 99.9  F (37.7 C), temperature source Oral, resp. rate 18, height 5\' 2"  (1.575 m), weight 88.5 kg (195 lb 1.7 oz), SpO2 97.00%.  Intake/Output Summary (Last 24 hours) at  12/05/12 1111 Last data filed at 12/05/12 0900  Gross per 24 hour  Intake    660 ml  Output      0 ml  Net    660 ml   Exam: General: No acute respiratory distress Lungs: sporadic wheezes.  Cardiovascular: Regular rate and rhythm without murmur gallop or rub Abdomen: Nontender, nondistended, soft, bowel sounds positive, no rebound, no ascites, no appreciable mass Extremities: No significant cyanosis, clubbing, or edema bilateral lower extremities  Data Reviewed: Basic Metabolic Panel:  Recent Labs Lab 12/02/12 0620 12/02/12 0935 12/02/12 2110 12/03/12 0400 12/03/12 1200 12/04/12 0540  NA 144 144 141  --  137 142  K 3.7 3.3* 3.8  --  2.9* 3.8  CL 114* 116* 109  --  104 106  CO2 21 18* 27  --  28 32  GLUCOSE 105* 87 110*  --  128* 93  BUN 10 7 8   --  3* <3*  CREATININE 0.44* 0.34* 0.40*  --  0.32* 0.49*  CALCIUM 7.9* 6.5* 8.1*  --  7.9* 8.6  MG  --   --   --  1.8  --   --   PHOS  --   --   --  1.5*  --  1.7*   Liver Function Tests:  Recent Labs Lab 12/01/12 1612 12/01/12 2330 12/02/12 0212 12/04/12 0540  AST 41* 39* 40* 32  ALT 26 23 23 21   ALKPHOS 48 43 47 49  BILITOT 0.1* 0.1* 0.2* 0.3  PROT 7.1 6.8 7.0 6.3  ALBUMIN 3.2* 3.0* 3.0* 2.6*    Recent Labs Lab 12/01/12 1607 12/01/12 2331  AMMONIA 34 23   CBC:  Recent Labs Lab 12/01/12 1612 12/02/12 0212 12/03/12 0400 12/04/12 0540  WBC 7.3 9.5 6.3 7.2  NEUTROABS 4.4  --   --   --   HGB 11.9* 11.4* 10.4* 11.1*  HCT 36.0 35.2* 32.1* 34.8*  MCV 96.8 96.4 97.0 98.3  PLT 166 174 153 171   Cardiac Enzymes:  Recent Labs Lab 12/01/12 1612 12/01/12 2053 12/01/12 2331 12/02/12 0620  TROPONINI <0.30 <0.30 <0.30 <0.30   CBG:  Recent Labs Lab 12/01/12 1618  GLUCAP 102*    Recent Results (from the past 240 hour(s))  URINE CULTURE     Status: None   Collection Time    12/01/12  6:01 PM      Result Value Range Status   Specimen Description URINE, CLEAN CATCH   Final   Special Requests NONE    Final   Culture  Setup Time 12/01/2012 20:29   Final   Colony Count >=100,000 COLONIES/ML   Final   Culture KLEBSIELLA PNEUMONIAE   Final   Report Status 12/03/2012 FINAL   Final   Organism ID, Bacteria KLEBSIELLA PNEUMONIAE   Final  MRSA PCR SCREENING     Status: Abnormal   Collection Time    12/01/12  7:02 PM      Result Value Range Status   MRSA by PCR POSITIVE (*) NEGATIVE Final   Comment:            The GeneXpert MRSA Assay (FDA     approved for NASAL specimens     only), is one component of a     comprehensive MRSA colonization     surveillance  program. It is not     intended to diagnose MRSA     infection nor to guide or     monitor treatment for     MRSA infections.     RESULT CALLED TO, READ BACK BY AND VERIFIED WITH:     Mikki Santee RN [id number] 12/01/12 A BROWNING  CULTURE, BLOOD (ROUTINE X 2)     Status: None   Collection Time    12/01/12  8:15 PM      Result Value Range Status   Specimen Description BLOOD ARM RIGHT   Final   Special Requests BOTTLES DRAWN AEROBIC AND ANAEROBIC 10CC   Final   Culture  Setup Time 12/02/2012 00:27   Final   Culture     Final   Value:        BLOOD CULTURE RECEIVED NO GROWTH TO DATE CULTURE WILL BE HELD FOR 5 DAYS BEFORE ISSUING A FINAL NEGATIVE REPORT   Report Status PENDING   Incomplete  CULTURE, BLOOD (ROUTINE X 2)     Status: None   Collection Time    12/01/12  8:30 PM      Result Value Range Status   Specimen Description BLOOD ARM RIGHT   Final   Special Requests BOTTLES DRAWN AEROBIC AND ANAEROBIC 10CC   Final   Culture  Setup Time 12/02/2012 00:27   Final   Culture     Final   Value:        BLOOD CULTURE RECEIVED NO GROWTH TO DATE CULTURE WILL BE HELD FOR 5 DAYS BEFORE ISSUING A FINAL NEGATIVE REPORT   Report Status PENDING   Incomplete       Scheduled Meds:  Scheduled Meds: . ipratropium  0.5 mg Nebulization Q6H   And  . albuterol  2.5 mg Nebulization Q6H  . Chlorhexidine Gluconate Cloth  6 each Topical Q0600  . folic  acid  1 mg Oral Daily  . heparin  5,000 Units Subcutaneous Q8H  . lactulose  30 g Oral BID  . levofloxacin  750 mg Oral Daily  . multivitamin with minerals  1 tablet Oral Daily  . mupirocin ointment  1 application Nasal BID  . thiamine  100 mg Oral Daily   Continuous Infusions: . sodium chloride Stopped (12/04/12 1000)    Time spent on care of this patient: 25   REGALADO,BELKYS [phone]. [facility] Office  [phone] Pager - Text Page per Loretha Stapler as per below:  On-Call/Text Page:      [email or web address]      password [email or web address]  If 7PM-7AM, please contact night-coverage [email or web address] Password [email or web address] 12/05/2012, 11:11 AM   LOS: 4 days

## 2012-12-05 NOTE — Progress Notes (Signed)
Clinical Social Work Department CLINICAL SOCIAL WORK PSYCHIATRY SERVICE LINE ASSESSMENT 12/05/2012  Patient:  [name] [name]  Account:  [id number]  Admit Date:  12/01/2012  Clinical Social Worker:  Robin Searing  Date/Time:  12/05/2012 01:22 PM Referred by:  Physician  Date referred:  12/05/2012 Reason for Referral  Behavioral Health Issues   Presenting Symptoms/Problems (In the person's/family's own words):   Hallucinating   Abuse/Neglect/Trauma History (check all that apply)  Denies history   Abuse/Neglect/Trauma Comments:   Psychiatric History (check all that apply)  Inpatient/hospitilization  Outpatient treatment   Psychiatric medications:  Vibbryd  Naltrexone   Current Mental Health Hospitalizations/Previous Mental Health History:   Pateint reports she has been to several inpatient detox settings over the years including [facility] and [facility]. She currently is seen as an outpatient at the [facility] where she has a Veterinary surgeon, Psychotherapist and a Psychiatrist (Dr Mila Homer). SHe goes to the [facility] 3 times a week for 2.5 hour sessions-   Current provider:   Dr. Mila Homer at [facility]   Place and Date:   weekly/on-going   Current Medications:   Scheduled Meds:      . ipratropium  0.5 mg Nebulization Q6H   And     . albuterol  2.5 mg Nebulization Q6H  . Chlorhexidine Gluconate Cloth  6 each Topical Q0600  . folic acid  1 mg Oral Daily  . heparin  5,000 Units Subcutaneous Q8H  . levofloxacin  750 mg Oral Daily  . multivitamin with minerals  1 tablet Oral Daily  . mupirocin ointment  1 application Nasal BID  . thiamine  100 mg Oral Daily        Continuous Infusions:      . sodium chloride Stopped (12/04/12 1000)          PRN Meds:.acetaminophen, acetaminophen, ondansetron (ZOFRAN) IV, ondansetron       Previous Impatient Admission/Date/Reason:   Emotional Health / Current Symptoms    Suicide/Self Harm  None reported   Suicide attempt in the past:    Other harmful behavior:   Psychotic/Dissociative Symptoms  None reported   Other Psychotic/Dissociative Symptoms:     Other Attention / Behavioral Symptoms:   Patient very engaged in [place] which included her, her daughter and myself.    Cognitive Impairment  Within Normal Limits   Other Cognitive Impairment:       Anxiety (frequency):   Phobia (specify):   Compulsive behavior (specify):   Obsessive behavior (specify):   Other:   Substance Abuse/Use  In recovery   SBIRT completed (please refer for detailed history):  N  Self-reported substance use:   Last use wias 35 days ago! The daughter has documented this on the erase board at bedside- Prior to her sobriety, she was drinking heavily (12-24 beers daily) fir the past 25 years (since she was 62 years old).   Urinary Drug Screen Completed:  Y Alcohol level:   <11    Environmental/Housing/Living Arrangement  With Family Member   Who is in the home:   Pateint and her ex-husband are house mates sharing a home and looking after one another- he has some medical issues that require supervision/assistance so it has worked out well for them-   Emergency contact:  Daughter- Charmaine Downs [phone]   Financial  Medicaid   Patient's Strengths and Goals (patient's own words):   Pateint has great support from her daughter, ex-husband and her friends including her AA support groups/sponsor.  She is very motivated to stay sober and is committed to treatments,meetings and RX to sustain this.   Clinical Social Worker's Interpretive Summary:   Patient is genuinely ready to stay sober- she has committed herself to the intense program at the [facility]. She admits to using [facility] POWDERS (up to 6 a day) for pain relief, yet knows this is not a good rx for her to use and is open to alternate options that may be recommended as well as for f/u at a Pain Clinic.   Disposition:  Recommend Psych CSW continuing to support  while in h Reece Levy, MSW, [place] [phone]

## 2012-12-05 NOTE — Consult Note (Signed)
Patient Identification:  [name] [name] Date of Evaluation:  12/05/2012 Reason for Consult:  Depression, Alcohol Use  Referring Provider: Dr. Nada Libman  History of Present Illness: Pt presented in the ED due to progressive confusion  Her labs were essentially normal.  She had a very short attention span and could not verbally participate in the interview.   Past Psychiatric History: She haad jsut been at [facility] for cutting her wrist.  She says she began drinking alcohol age 8 and never stopped.  She says her mother had 7 children.  Mother drank, all children grew up to be alcoholics  At age [age] she was adopted by a wonderful couple.  She completed the 11th grade.    She married and has a daughter.  She continued drinking until her doctor told her she has cirrhosis.  She goes to  [facility] for full psychiatric services with focus on abstinence.  She is committed to her program and has decided to be honest with her physicians.   She has ben breain her abstinence here and there.    Past Medical History:     Past Medical History  Diagnosis Date  . Respiratory failure with hypoxia   . Pneumonia   . Altered mental status     secondary to ethyl alcohol withdrawal  . Systemic inflammatory response syndrome   . Tobacco abuse   . Diastolic congestive heart failure   . Depression   . HTN (hypertension)   . ETOH abuse   . Suicidal ideation   . COPD (chronic obstructive pulmonary disease)   . Anxiety   . Shortness of breath   . Asthma   . Headache   . Arthritis   . Anemia        Past Surgical History  Procedure Laterality Date  . Cesarean section    . Tubal ligation    . Cholecystectomy    . Cystectomy      both ears  . Total abdominal hysterectomy      Allergies:  Allergies  Allergen Reactions  . Penicillins     unknown  . Codeine Itching and Rash    Current Medications:  Prior to Admission medications   Medication Sig Start Date End Date Taking? Authorizing Provider   albuterol (PROAIR HFA) 108 (90 BASE) MCG/ACT inhaler Inhale 2 puffs into the lungs every 4 (four) hours as needed. : For Wheezing/shortness of breath 10/06/12  Yes Sanjuana Kava, NP  fish oil-omega-3 fatty acids 1000 MG capsule Take 1 g by mouth daily.   Yes Historical Provider, MD  lisinopril (PRINIVIL,ZESTRIL) 10 MG tablet Take 1 tablet (10 mg total) by mouth daily. For control of high blood pressure. 10/06/12  Yes Sanjuana Kava, NP  naltrexone (DEPADE) 50 MG tablet Take 25 mg by mouth at bedtime.   Yes Historical Provider, MD  traMADol (ULTRAM) 50 MG tablet Take 1 tablet (50 mg total) by mouth every 6 (six) hours as needed for pain. 10/14/12  Yes Tiffany Irine Seal, PA-C  Vilazodone HCl (VIIBRYD) 40 MG TABS Take 40 mg by mouth daily.   Yes Historical Provider, MD    Social History:    reports that she has been smoking Cigarettes.  She has a 40 pack-year smoking history. She has never used smokeless tobacco. She reports that  drinks alcohol. She reports that she does not use illicit drugs.   Family History:    Family History  Problem Relation Age of Onset  . Heart  disease Father   . Lung cancer Father   . Lung cancer Mother   . Diabetes Father   . Asthma Father   . Alcoholism Father     Mental Status Examination/Evaluation: Objective:  Appearance: Casual and asymetry of face with bulging L parotid-mandibular region  Eye Contact::  Good  Speech:  Clear and Coherent and Normal Rate  Volume:  Normal  Mood:  Euthymic, goal guided  Affect:  Appropriate and Congruent  Thought Process:  Coherent, Goal Directed and Logical  Orientation:  Full (Time, Place, and Person)  Thought Content:  NA  Denied AH, VH   Suicidal Thoughts:  No  Homicidal Thoughts:  No  Judgement:  Fair  Insight:  Good   DIAGNOSIS:   AXIS I  Alcohol Dependence,   AXIS II  Deferred  AXIS III See medical notes.  AXIS IV other psychosocial or environmental problems, problems related to social environment and  complications of chronic alcoholism, chirrosis,  AXIS V 41-50 serious symptoms   Assessment/Plan:    Dr. Nada Libman  Discussed with Psych CSW Daughter,  [name] [name] [phone] Mini mental status exam is intact except for short term memory and she recalls all three with prompts.   RECOMMENDATION:  1.  Pt has Capacity and demonstrates she is serious about rehabilitation and alcohol abstinence First thing to do when stressed?  "Call my sponsor" 2.  Naltrexone is useful for detos.  3.  *NB:  tramadol and Viibryd have DD interactions.  Tramadol is contraindicated with SNRI.  And may have caused reaction blamed on naltrexone.   4.  No further psychiatric need identified.  MD Psychiatrist signs off.     PHYLLIS BOGARD MD 12/05/2012 2:10 PM

## 2012-12-06 DIAGNOSIS — I951 Orthostatic hypotension: Secondary | ICD-10-CM

## 2012-12-06 MED ORDER — POTASSIUM PHOSPHATE MONOBASIC 500 MG PO TABS
500.0000 mg | ORAL_TABLET | Freq: Three times a day (TID) | ORAL | Status: DC
  Administered 2012-12-06 – 2012-12-07 (×3): 500 mg via ORAL
  Filled 2012-12-06 (×6): qty 1

## 2012-12-06 MED ORDER — FLUDROCORTISONE ACETATE 0.1 MG PO TABS
0.1000 mg | ORAL_TABLET | Freq: Every day | ORAL | Status: DC
  Administered 2012-12-06 – 2012-12-07 (×2): 0.1 mg via ORAL
  Filled 2012-12-06 (×2): qty 1

## 2012-12-06 MED ORDER — LACTULOSE 10 GM/15ML PO SOLN
10.0000 g | Freq: Once | ORAL | Status: AC
  Administered 2012-12-06: 10 g via ORAL
  Filled 2012-12-06: qty 15

## 2012-12-06 NOTE — Consult Note (Signed)
Patient Identification:  [name] [name] Date of Evaluation:  12/06/2012 Reason for Consult:  Medications, alcohol dependence, encephalopathy  Referring Provider: Dr. Lavera Guise  History of Present Illness:Pt presented in the ED due to progressive confusion Her labs were essentially normal. She had a very short attention span and could not verbally participate in the interview.   Past Psychiatric History: She haad jsut been at [facility] for cutting her wrist. She says she began drinking alcohol age 22 and never stopped. She says her mother had 7 children. Mother drank, all children grew up to be alcoholics At age [age] she was adopted by a wonderful couple. She completed the 11th grade. She married and has a daughter. She continued drinking until her doctor told her she has cirrhosis. She goes to  [facility] for full psychiatric services with focus on abstinence. She is committed to her program and has decided to be honest with her physicians. She has ben breain her abstinence here and there  Past Medical History:     Past Medical History  Diagnosis Date  . Respiratory failure with hypoxia   . Pneumonia   . Altered mental status     secondary to ethyl alcohol withdrawal  . Systemic inflammatory response syndrome   . Tobacco abuse   . Diastolic congestive heart failure   . Depression   . HTN (hypertension)   . ETOH abuse   . Suicidal ideation   . COPD (chronic obstructive pulmonary disease)   . Anxiety   . Shortness of breath   . Asthma   . Headache   . Arthritis   . Anemia        Past Surgical History  Procedure Laterality Date  . Cesarean section    . Tubal ligation    . Cholecystectomy    . Cystectomy      both ears  . Total abdominal hysterectomy      Allergies:  Allergies  Allergen Reactions  . Penicillins     unknown  . Codeine Itching and Rash    Current Medications:  Prior to Admission medications   Medication Sig Start Date End Date Taking? Authorizing Provider   albuterol (PROAIR HFA) 108 (90 BASE) MCG/ACT inhaler Inhale 2 puffs into the lungs every 4 (four) hours as needed. : For Wheezing/shortness of breath 10/06/12  Yes Sanjuana Kava, NP  fish oil-omega-3 fatty acids 1000 MG capsule Take 1 g by mouth daily.   Yes Historical Provider, MD  lisinopril (PRINIVIL,ZESTRIL) 10 MG tablet Take 1 tablet (10 mg total) by mouth daily. For control of high blood pressure. 10/06/12  Yes Sanjuana Kava, NP  naltrexone (DEPADE) 50 MG tablet Take 25 mg by mouth at bedtime.   Yes Historical Provider, MD  traMADol (ULTRAM) 50 MG tablet Take 1 tablet (50 mg total) by mouth every 6 (six) hours as needed for pain. 10/14/12  Yes Tiffany Irine Seal, PA-C  Vilazodone HCl (VIIBRYD) 40 MG TABS Take 40 mg by mouth daily.   Yes Historical Provider, MD    Social History:    reports that she has been smoking Cigarettes.  She has a 40 pack-year smoking history. She has never used smokeless tobacco. She reports that  drinks alcohol. She reports that she does not use illicit drugs.   Family History:    Family History  Problem Relation Age of Onset  . Heart disease Father   . Lung cancer Father   . Lung cancer Mother   .  Diabetes Father   . Asthma Father   . Alcoholism Father     Mental Status Examination/Evaluation: Objective:  Appearance: Casual and swollen parotid-mandibular area  Eye Contact::  Good  Speech:  Clear and Coherent and Normal Rate  Volume:  Normal  Mood:  positive  Affect:  Appropriate and Congruent  Thought Process:  Coherent, Goal Directed and Linear  Orientation:  Full (Time, Place, and Person)  Thought Content:  Rumination and Relection;  need to tell MDs the Truth, addiction to [facility] poweders  Suicidal Thoughts:  No  Homicidal Thoughts:  No  Judgement:  Fair  Insight:  Fair   DIAGNOSIS:   AXIS I Alcohol Dependence, in recovery; BC Powders Dependence;   AXIS II  Deferred  AXIS III See medical notes.  AXIS IV educational problems, other psychosocial  or environmental problems, problems related to social environment and very involved in rehabilitation program for alcohol  AXIS V 41-50 serious symptoms   Assessment/Plan:  Dr. Lavera Guise,  Psych CSW Pt is awake, active and engages in dialogue.  She admits she has replaced alcohol addiction with food addiction and has gained wt.  She also admits addiction to her [facility] Powders - ~ 50/day! She is encouraged to change eating habits.  She needs to stop using BC Powders.  She has called her therapist and is hoping to be discharged to return to her rehab program.  She denies suicidal thoughts and says she will be trying to eat [binge] less and stay positive.   RECOMMENDATION:  1.  Pt has capacity 2.  MD needs to monitor [facility] powder usage 3.  Continue with referral to her alcohol reabilitation program 4.  Naloxone is not the problem.  5.  Consider Depakote 250 mg at night with pre-Rx metabolic panel with liver enzymes. 6.  No further psych needs unless requested  PHYLLIS BOGARD MD 12/06/2012 9:30 PM

## 2012-12-06 NOTE — Progress Notes (Signed)
[facility] Progress Note    [name] [name] WUJ:[id number] DOB: Oct 24, 1950 DOA: 12/01/2012 PCP: Buren Kos, MD  Brief narrative: 62 y.o. female with history of alcohol abuse, hypertension and major depression. Patient had recent admission to [facility] for suicidal attempt with cutting her wrist. Patient presented to the emergency department because of confusion. The history was obtained primarily from the daughter who said the pt had some confusion about 2 weeks prior to her admit and a workup was done at Dr. Jeannetta Nap office, but they never heard about the results. She started to have increasing confusion and lethargy, progressing to the point the family could not keep her awake. Upon initial evaluation in the emergency department patient was extremely lethargic but opened her eyes - her concentration span was about 5 seconds.  CT scan of the head was negative, her blood alcohol level was <11, normal ammonia level, ABG showed normal PCO2 with mild acidosis.    Assessment/Plan:  Lethargy / toxic metabolic encephalopahty Unclear etiology but most likely polypharmacy - new medications started approx 3 weeks ago (naltrexone & change in anti-depressant), infection, UTI.  RPR negative, HIV negative, UDS negative. CT head negative.  Improving. Will ask Psych to help with medications for depression and alcohol use.  PT, OT consult.   Salicylate OD - mild initial salicylate level 34.9 and now resolved - treated with bicarbonate hydration. - confirmation salicylate level decrease to less than 2. Patient was taking up to 6 BC powder.   Hypotension Required dopamine support - was originally felt to likely be due to simple volume depletion - has now recurred despite volume resuscitation - cortisol level noted to be "inappropriately normal" in the setting of severe acute distress/hypotension - ACTH stim test pending. Blood pressure increasing.  Cortisol was  at 5.2.  Patient ha  srelative adrenal insufficiency with orthostasis - started on florinef 0.1 mg daily 12/06/12   Suppressed TSH Free T4  0.97 - WNL  Hypokalemia Resolved w/ replacement. B-met am.   Hx of EtOH abuse Per her daughter she is going to [facility], she's been sober for the past 30 days - no evidence of withdrawal at this time. Since patient was started on Naltrexon daughter has notice change in mental status. Psych consulted to help with medications.   Major depression w/ hx of suicide attempts Denies suicidal ideation. Will ask Psych consult to help with medication. Her encephalopathy was thought to be multifactorial medications, infection, increase salicylates.  Kleb pneumoniae urinary tract infection  Klebsiella pan-sensitive. levaquin   Tobacco abuse w/ probable COPD Wheezing lung exam. Nebulizer treatments, albuterol and ipratropium.        Code Status: FULL   Family Communication:no family  Disposition Plan: home    Antibiotics: Vanco 3/7>>>3/8  Imipenem 3/7>>>3/8 Levaquin 3/9 >>  DVT prophylaxis: lovenox  HPI/Subjective: Still dizzy but better   Objective: Blood pressure 152/48, pulse 85, temperature 99.1 F (37.3 C), temperature source Oral, resp. rate 18, height 5\' 2"  (1.575 m), weight 88.5 kg (195 lb 1.7 oz), SpO2 96.00%. No intake or output data in the 24 hours ending 12/06/12 1748 Patient Vitals for the past 24 hrs:  BP Temp Temp src Pulse Resp SpO2  12/06/12 1541 - - - - - 96 %  12/06/12 1417 152/48 mmHg 99.1 F (37.3 C) - 85 18 95 %  12/06/12 1250 143/83 mmHg - - 98 - 100 %  12/06/12 1247 92/60 mmHg - - 103 - 98 %  12/06/12 1245  144/83 mmHg - - 94 - 100 %  12/06/12 1242 118/82 mmHg - - 92 - 99 %  12/06/12 0912 - - - - - 95 %  12/06/12 0500 142/80 mmHg 98 F (36.7 C) Oral 77 16 96 %  12/05/12 2239 144/79 mmHg 98.2 F (36.8 C) Oral 85 16 96 %    Exam: General: No acute respiratory distress  Cardiovascular: Regular rate and rhythm without murmur  gallop or rub Abdomen: Nontender, nondistended, soft, bowel sounds positive, no rebound, no ascites, no appreciable mass Extremities: No significant cyanosis, clubbing, or edema bilateral lower extremities  Data Reviewed: Basic Metabolic Panel:  Recent Labs Lab 12/02/12 0620 12/02/12 0935 12/02/12 2110 12/03/12 0400 12/03/12 1200 12/04/12 0540  NA 144 144 141  --  137 142  K 3.7 3.3* 3.8  --  2.9* 3.8  CL 114* 116* 109  --  104 106  CO2 21 18* 27  --  28 32  GLUCOSE 105* 87 110*  --  128* 93  BUN 10 7 8   --  3* <3*  CREATININE 0.44* 0.34* 0.40*  --  0.32* 0.49*  CALCIUM 7.9* 6.5* 8.1*  --  7.9* 8.6  MG  --   --   --  1.8  --   --   PHOS  --   --   --  1.5*  --  1.7*   Liver Function Tests:  Recent Labs Lab 12/01/12 1612 12/01/12 2330 12/02/12 0212 12/04/12 0540  AST 41* 39* 40* 32  ALT 26 23 23 21   ALKPHOS 48 43 47 49  BILITOT 0.1* 0.1* 0.2* 0.3  PROT 7.1 6.8 7.0 6.3  ALBUMIN 3.2* 3.0* 3.0* 2.6*    Recent Labs Lab 12/01/12 1607 12/01/12 2331  AMMONIA 34 23   CBC:  Recent Labs Lab 12/01/12 1612 12/02/12 0212 12/03/12 0400 12/04/12 0540  WBC 7.3 9.5 6.3 7.2  NEUTROABS 4.4  --   --   --   HGB 11.9* 11.4* 10.4* 11.1*  HCT 36.0 35.2* 32.1* 34.8*  MCV 96.8 96.4 97.0 98.3  PLT 166 174 153 171   Cardiac Enzymes:  Recent Labs Lab 12/01/12 1612 12/01/12 2053 12/01/12 2331 12/02/12 0620  TROPONINI <0.30 <0.30 <0.30 <0.30   CBG:  Recent Labs Lab 12/01/12 1618  GLUCAP 102*    Recent Results (from the past 240 hour(s))  URINE CULTURE     Status: None   Collection Time    12/01/12  6:01 PM      Result Value Range Status   Specimen Description URINE, CLEAN CATCH   Final   Special Requests NONE   Final   Culture  Setup Time 12/01/2012 20:29   Final   Colony Count >=100,000 COLONIES/ML   Final   Culture KLEBSIELLA PNEUMONIAE   Final   Report Status 12/03/2012 FINAL   Final   Organism ID, Bacteria KLEBSIELLA PNEUMONIAE   Final  MRSA PCR  SCREENING     Status: Abnormal   Collection Time    12/01/12  7:02 PM      Result Value Range Status   MRSA by PCR POSITIVE (*) NEGATIVE Final   Comment:            The GeneXpert MRSA Assay (FDA     approved for NASAL specimens     only), is one component of a     comprehensive MRSA colonization     surveillance program. It is not     intended to diagnose  MRSA     infection nor to guide or     monitor treatment for     MRSA infections.     RESULT CALLED TO, READ BACK BY AND VERIFIED WITH:     Mikki Santee RN [phone] 12/01/12 A BROWNING  CULTURE, BLOOD (ROUTINE X 2)     Status: None   Collection Time    12/01/12  8:15 PM      Result Value Range Status   Specimen Description BLOOD ARM RIGHT   Final   Special Requests BOTTLES DRAWN AEROBIC AND ANAEROBIC 10CC   Final   Culture  Setup Time 12/02/2012 00:27   Final   Culture     Final   Value:        BLOOD CULTURE RECEIVED NO GROWTH TO DATE CULTURE WILL BE HELD FOR 5 DAYS BEFORE ISSUING A FINAL NEGATIVE REPORT   Report Status PENDING   Incomplete  CULTURE, BLOOD (ROUTINE X 2)     Status: None   Collection Time    12/01/12  8:30 PM      Result Value Range Status   Specimen Description BLOOD ARM RIGHT   Final   Special Requests BOTTLES DRAWN AEROBIC AND ANAEROBIC 10CC   Final   Culture  Setup Time 12/02/2012 00:27   Final   Culture     Final   Value:        BLOOD CULTURE RECEIVED NO GROWTH TO DATE CULTURE WILL BE HELD FOR 5 DAYS BEFORE ISSUING A FINAL NEGATIVE REPORT   Report Status PENDING   Incomplete       Scheduled Meds:  Scheduled Meds: . fludrocortisone  0.1 mg Oral Daily  . folic acid  1 mg Oral Daily  . heparin  5,000 Units Subcutaneous Q8H  . levofloxacin  750 mg Oral Daily  . multivitamin with minerals  1 tablet Oral Daily  . thiamine  100 mg Oral Daily   Continuous Infusions: . sodium chloride Stopped (12/04/12 1000)       Syvanna Ciolino [phone] [facility] Office  [phone] Pager - Text Page per Loretha Stapler  as per below:  On-Call/Text Page:      [email or web address]      password [email or web address]  If 7PM-7AM, please contact night-coverage [email or web address] Password [facility] 12/06/2012, 5:48 PM   LOS: 5 days

## 2012-12-06 NOTE — Progress Notes (Signed)
Orthostatic vital signs completed.  Please refer to vital sign documentation.

## 2012-12-07 DIAGNOSIS — I10 Essential (primary) hypertension: Secondary | ICD-10-CM

## 2012-12-07 LAB — ACTH STIMULATION, 3 TIME POINTS: Cortisol, 60 Min: 45.3 ug/dL (ref >20)

## 2012-12-07 MED ORDER — LEVOFLOXACIN 500 MG PO TABS: 750.0000 mg | ORAL_TABLET | Freq: Every day | ORAL | Status: DC

## 2012-12-07 MED ORDER — FLUDROCORTISONE ACETATE 0.1 MG PO TABS: 0.1000 mg | ORAL_TABLET | Freq: Every day | ORAL | Status: DC

## 2012-12-07 MED ORDER — ACETAMINOPHEN 325 MG PO TABS: 325.0000 mg | ORAL_TABLET | Freq: Four times a day (QID) | ORAL | Status: DC | PRN

## 2012-12-07 MED ORDER — ADULT MULTIVITAMIN W/MINERALS CH: 1.0000 | ORAL_TABLET | Freq: Every day | ORAL | Status: DC

## 2012-12-07 MED ORDER — FOLIC ACID 1 MG PO TABS: 1.0000 mg | ORAL_TABLET | Freq: Every day | ORAL | Status: DC

## 2012-12-07 MED ORDER — THIAMINE HCL 100 MG PO TABS: 100.0000 mg | ORAL_TABLET | Freq: Every day | ORAL | Status: DC

## 2012-12-07 NOTE — Progress Notes (Signed)
Discharge instructions given and reviewed with patient and daughter. Patient discharged home with family.

## 2012-12-07 NOTE — Discharge Summary (Signed)
Physician Discharge Summary  [name] [name] NWG:[id number] DOB: 08-Dec-1950 DOA: 12/01/2012  PCP: Buren Kos, MD  Admit date: 12/01/2012 Discharge date: 12/07/2012  Time spent: 40 minutes  Recommendations for Outpatient Follow-up:  Patient must abstain from drinking alcohol Discharge Diagnoses:  Urinary tract infection   Orthostasis   TOBACCO USE   HYPERTENSION   Alcohol dependence   Major depression   Lethargy   Salicylate overdose   Discharge Condition: Good  Diet recommendation: Regular  Filed Weights   12/01/12 1939 12/03/12 0500  Weight: 84.7 kg (186 lb 11.7 oz) 88.5 kg (195 lb 1.7 oz)    History of present illness:  [name] [name] is a 62 y.o. female with testicle history of alcohol abuse, hypertension and major depression. Patient had recent admission to behavioral health hospital for suicidal attempt with cutting her wrist. Patient presents today to the emergency department because of confusion. The history was obtained primarily from the daughter, she said she had some confusion about 2 weeks ago and workup was done on Dr. Jeannetta Nap office, they did not call them with the blood work results. She started to have increasing confusion and lethargy since last night, today she can even stay awake. Upon initial evaluation in the emergency department patient is extremely lethargic but she opened her eyes her concentration span is probably about 5 seconds, CT scan of the head was negative, her blood alcohol level is <11, normal ammonia level, ABG showed normal PCO2 with mild acidosis. The rest of the studies including UDS, urinalysis are pending at the time of dictation.   Hospital Course:  Lethargy / toxic metabolic encephalopathy - present on admission Unclear etiology but most likely polypharmacy - new medications started approx 3 weeks ago (naltrexone & change in anti-depressant), infection, UTI. Patient was admitted to step down unit. We did check RPR negative, HIV  negative, UDS negative. CT head negative.   Salicylate OD - mild  initial salicylate level 34.9 and now resolved - treated with bicarbonate hydration. - confirmation salicylate level decrease to less than 2. Patient was taking up to 6 BC powders per day and sometimes even more. I have discussed with the patient the importance of taking this medication  Hypotension  Required dopamine support - was originally felt to likely be due to simple volume depletion -  It recurred despite volume resuscitation - cortisol level noted to be "inappropriately normal" in the setting of severe acute distress/hypotension -  Cortisol was at 5.2.  Patient has relative adrenal insufficiency with orthostasis - started on florinef 0.1 mg daily 12/06/12  Suppressed TSH  Free T4 0.97 - WNL  Hypokalemia  Resolved w/ replacement.  Hx of EtOH abuse   she's been sober for the past 30 days - no evidence of withdrawal at this time.  Since patient was started on Naltrexon daughter has notice change in mental status.  Patient will followup with her support group at the [facility] Major depression w/ hx of suicide attempts  Denies suicidal ideation.  Her encephalopathy was thought to be multifactorial medications, infection, increase salicylates.  Currently the patient was not suicidal Kleb pneumoniae urinary tract infection  Klebsiella pan-sensitive. levaquin started IV in the hospital and will be continued by mouth at the time of discharge    Procedures:  None  Consultations:  Pulmonary critical care  Psychiatry  Discharge Exam: Filed Vitals:   12/06/12 1541 12/06/12 2100 12/07/12 0530 12/07/12 0611  BP:  141/87 119/96 120/82  Pulse:  82 64  Temp:  99.2 F (37.3 C) 98.4 F (36.9 C)   TempSrc:  Oral Oral   Resp:  20 20   Height:      Weight:      SpO2: 96% 98% 92%     General: Alert and oriented x3 Cardiovascular: Regular rate and rhythm without murmurs rubs or gallops Respiratory: Clear to  auscultation bilaterally  Discharge Instructions  Discharge Orders   Future Orders Complete By Expires     Diet - low sodium heart healthy  As directed     Increase activity slowly  As directed         Medication List    STOP taking these medications       lisinopril 10 MG tablet  Commonly known as:  PRINIVIL,ZESTRIL     naltrexone 50 MG tablet  Commonly known as:  DEPADE     traMADol 50 MG tablet  Commonly known as:  ULTRAM      TAKE these medications       acetaminophen 325 MG tablet  Commonly known as:  TYLENOL  Take 1 tablet (325 mg total) by mouth every 6 (six) hours as needed for pain.     albuterol 108 (90 BASE) MCG/ACT inhaler  Commonly known as:  PROAIR HFA  Inhale 2 puffs into the lungs every 4 (four) hours as needed. : For Wheezing/shortness of breath     fish oil-omega-3 fatty acids 1000 MG capsule  Take 1 g by mouth daily.     fludrocortisone 0.1 MG tablet  Commonly known as:  FLORINEF  Take 1 tablet (0.1 mg total) by mouth daily.     folic acid 1 MG tablet  Commonly known as:  FOLVITE  Take 1 tablet (1 mg total) by mouth daily.     levofloxacin 500 MG tablet  Commonly known as:  LEVAQUIN  Take 1.5 tablets (750 mg total) by mouth daily.     multivitamin with minerals Tabs  Take 1 tablet by mouth daily.     thiamine 100 MG tablet  Take 1 tablet (100 mg total) by mouth daily.     VIIBRYD 40 MG Tabs  Generic drug:  Vilazodone HCl  Take 40 mg by mouth daily.           Follow-up Information   Schedule an appointment as soon as possible for a visit with Buren Kos, MD.   Contact information:   [address], [address]                         [address] [place] [address] [phone]        The results of significant diagnostics from this hospitalization (including imaging, microbiology, ancillary and laboratory) are listed below for reference.    Significant Diagnostic Studies: Dg Chest 2 View  12/01/2012  *RADIOLOGY REPORT*   Clinical Data: Altered mental status, history hypertension, COPD, smoking  CHEST - 2 VIEW  Comparison: 03/30/2011  Findings: Minimal enlargement of cardiac silhouette. Mildly tortuous thoracic aorta. Otherwise normal mediastinal contours and pulmonary vascularity. Lungs clear. No pleural effusion or pneumothorax. Minimal chronic peribronchial thickening noted.  IMPRESSION: Minimal chronic bronchitic changes. No acute abnormalities.   Original Report Authenticated By: Ulyses Southward, M.D.    Dg Ribs Unilateral Right  12/05/2012  *RADIOLOGY REPORT*  Clinical Data: Fall, right rib pain.  RIGHT RIBS - 2 VIEW  Comparison: 12/01/2012  Findings: Healing fractures noted in the right lateral fifth and sixth  ribs.  No additional bony abnormality.  No effusion or pneumothorax.  Diffuse interstitial prominence and peribronchial thickening noted within the right lung.  IMPRESSION: Healing right fifth and sixth lateral rib fractures without effusion or pneumothorax.   Original Report Authenticated By: Charlett Nose, M.D.    Ct Head Wo Contrast  12/01/2012  *RADIOLOGY REPORT*  Clinical Data: Altered mental status and shortness of breath.  CT HEAD WITHOUT CONTRAST  Technique:  Contiguous axial images were obtained from the base of the skull through the vertex without contrast.  Comparison: 02/02/2011  Findings: Bone windows demonstrate persistent mild mucosal thickening of the right maxillary sinus.  Hyperostosis frontalis interna.  Soft tissue windows demonstrate no  mass lesion, hemorrhage, hydrocephalus, acute infarct, intra-axial, or extra-axial fluid collection.  IMPRESSION:  1. No acute intracranial abnormality. 2.  Minimal sinus disease.   Original Report Authenticated By: Jeronimo Greaves, M.D.    US Abdomen Complete  12/02/2012  *RADIOLOGY REPORT*  Clinical Data:  Confusion, EtOH, possible cirrhosis  COMPLETE ABDOMINAL ULTRASOUND  Comparison:  None.  Findings:  Gallbladder:  Surgically absent.  Common bile duct:  Measures 11  mm.  Liver:  Nodular hepatic contour, suggesting cirrhosis.  No focal hepatic lesion is seen.  IVC:  Appears normal.  Pancreas:  Incompletely visualized but grossly unremarkable.  Spleen:  Measures 6.0 cm.  Right Kidney:  Measures 11.8 cm.  No mass or hydronephrosis.  Left Kidney:  Measures 11.2 cm.  1.6 x 1.7 x 1.3 cm upper pole cyst.  No hydronephrosis.  Abdominal aorta:  No aneurysm identified.  IMPRESSION: Nodular hepatic contour, suggesting cirrhosis.  Status post cholecystectomy.  Common duct measures 11 mm, likely postsurgical.   Original Report Authenticated By: Charline Bills, M.D.     Microbiology: Recent Results (from the past 240 hour(s))  URINE CULTURE     Status: None   Collection Time    12/01/12  6:01 PM      Result Value Range Status   Specimen Description URINE, CLEAN CATCH   Final   Special Requests NONE   Final   Culture  Setup Time 12/01/2012 20:29   Final   Colony Count >=100,000 COLONIES/ML   Final   Culture KLEBSIELLA PNEUMONIAE   Final   Report Status 12/03/2012 FINAL   Final   Organism ID, Bacteria KLEBSIELLA PNEUMONIAE   Final  MRSA PCR SCREENING     Status: Abnormal   Collection Time    12/01/12  7:02 PM      Result Value Range Status   MRSA by PCR POSITIVE (*) NEGATIVE Final   Comment:            The GeneXpert MRSA Assay (FDA     approved for NASAL specimens     only), is one component of a     comprehensive MRSA colonization     surveillance program. It is not     intended to diagnose MRSA     infection nor to guide or     monitor treatment for     MRSA infections.     RESULT CALLED TO, READ BACK BY AND VERIFIED WITH:     Mikki Santee RN [id number] 12/01/12 A BROWNING  CULTURE, BLOOD (ROUTINE X 2)     Status: None   Collection Time    12/01/12  8:15 PM      Result Value Range Status   Specimen Description BLOOD ARM RIGHT   Final   Special Requests BOTTLES DRAWN AEROBIC AND ANAEROBIC  10CC   Final   Culture  Setup Time 12/02/2012 00:27   Final   Culture      Final   Value:        BLOOD CULTURE RECEIVED NO GROWTH TO DATE CULTURE WILL BE HELD FOR 5 DAYS BEFORE ISSUING A FINAL NEGATIVE REPORT   Report Status PENDING   Incomplete  CULTURE, BLOOD (ROUTINE X 2)     Status: None   Collection Time    12/01/12  8:30 PM      Result Value Range Status   Specimen Description BLOOD ARM RIGHT   Final   Special Requests BOTTLES DRAWN AEROBIC AND ANAEROBIC 10CC   Final   Culture  Setup Time 12/02/2012 00:27   Final   Culture     Final   Value:        BLOOD CULTURE RECEIVED NO GROWTH TO DATE CULTURE WILL BE HELD FOR 5 DAYS BEFORE ISSUING A FINAL NEGATIVE REPORT   Report Status PENDING   Incomplete     Labs: Basic Metabolic Panel:  Recent Labs Lab 12/02/12 0620 12/02/12 0935 12/02/12 2110 12/03/12 0400 12/03/12 1200 12/04/12 0540  NA 144 144 141  --  137 142  K 3.7 3.3* 3.8  --  2.9* 3.8  CL 114* 116* 109  --  104 106  CO2 21 18* 27  --  28 32  GLUCOSE 105* 87 110*  --  128* 93  BUN 10 7 8   --  3* <3*  CREATININE 0.44* 0.34* 0.40*  --  0.32* 0.49*  CALCIUM 7.9* 6.5* 8.1*  --  7.9* 8.6  MG  --   --   --  1.8  --   --   PHOS  --   --   --  1.5*  --  1.7*   Liver Function Tests:  Recent Labs Lab 12/01/12 1612 12/01/12 2330 12/02/12 0212 12/04/12 0540  AST 41* 39* 40* 32  ALT 26 23 23 21   ALKPHOS 48 43 47 49  BILITOT 0.1* 0.1* 0.2* 0.3  PROT 7.1 6.8 7.0 6.3  ALBUMIN 3.2* 3.0* 3.0* 2.6*   No results found for this basename: LIPASE, AMYLASE,  in the last 168 hours  Recent Labs Lab 12/01/12 1607 12/01/12 2331  AMMONIA 34 23   CBC:  Recent Labs Lab 12/01/12 1612 12/02/12 0212 12/03/12 0400 12/04/12 0540  WBC 7.3 9.5 6.3 7.2  NEUTROABS 4.4  --   --   --   HGB 11.9* 11.4* 10.4* 11.1*  HCT 36.0 35.2* 32.1* 34.8*  MCV 96.8 96.4 97.0 98.3  PLT 166 174 153 171   Cardiac Enzymes:  Recent Labs Lab 12/01/12 1612 12/01/12 2053 12/01/12 2331 12/02/12 0620  TROPONINI <0.30 <0.30 <0.30 <0.30   BNP: BNP (last 3 results) No  results found for this basename: PROBNP,  in the last 8760 hours CBG:  Recent Labs Lab 12/01/12 1618  GLUCAP 102*       Signed:  LAZA,SORIN  [facility] 12/07/2012, 11:05 AM

## 2012-12-08 LAB — CULTURE, BLOOD (ROUTINE X 2)
Culture: NO GROWTH
Culture: NO GROWTH

## 2012-12-18 LAB — HEAVY METALS SCREEN, URINE

## 2013-10-23 ENCOUNTER — Encounter (HOSPITAL_COMMUNITY): Payer: Self-pay | Admitting: Pharmacy Technician

## 2013-10-29 ENCOUNTER — Encounter (HOSPITAL_COMMUNITY)
Admission: RE | Admit: 2013-10-29 | Discharge: 2013-10-29 | Disposition: A | Discharge status: Home / Self Care | Payer: Medicaid Other | Source: Ambulatory Visit | Attending: Orthopedic Surgery | Admitting: Orthopedic Surgery

## 2013-10-29 ENCOUNTER — Encounter (HOSPITAL_COMMUNITY): Payer: Self-pay

## 2013-10-29 DIAGNOSIS — Z01812 Encounter for preprocedural laboratory examination: Secondary | ICD-10-CM | POA: Insufficient documentation

## 2013-10-29 HISTORY — DX: Family history of other specified conditions: Z84.89

## 2013-10-29 HISTORY — DX: Nocturia: R35.1

## 2013-10-29 HISTORY — DX: Unspecified cirrhosis of liver: K74.60

## 2013-10-29 HISTORY — DX: Pain in unspecified joint: M25.50

## 2013-10-29 HISTORY — DX: Weakness: R53.1

## 2013-10-29 HISTORY — DX: Effusion, unspecified joint: M25.40

## 2013-10-29 HISTORY — DX: Frequency of micturition: R35.0

## 2013-10-29 LAB — PROTIME-INR
INR: 0.9 (ref 0.00–1.49)
PROTHROMBIN TIME: 12 s (ref 11.6–15.2)

## 2013-10-29 LAB — COMPREHENSIVE METABOLIC PANEL
ALBUMIN: 3.8 g/dL (ref 3.5–5.2)
ALK PHOS: 76 U/L (ref 39–117)
ALT: 16 U/L (ref 0–35)
AST: 30 U/L (ref 0–37)
BILIRUBIN TOTAL: 0.3 mg/dL (ref 0.3–1.2)
BUN: 14 mg/dL (ref 6–23)
CHLORIDE: 100 meq/L (ref 96–112)
CO2: 24 meq/L (ref 19–32)
CREATININE: 0.63 mg/dL (ref 0.50–1.10)
Calcium: 9.7 mg/dL (ref 8.4–10.5)
GFR calc Af Amer: >90 mL/min (ref >90)
GFR calc non Af Amer: >90 mL/min (ref >90)
Glucose, Bld: 98 mg/dL (ref 70–99)
Potassium: 5.1 mEq/L (ref 3.7–5.3)
Sodium: 139 mEq/L (ref 137–147)
Total Protein: 8.8 g/dL — ABNORMAL HIGH (ref 6.0–8.3)

## 2013-10-29 LAB — CBC WITH DIFFERENTIAL/PLATELET
Basophils Absolute: 0 10*3/uL (ref 0.0–0.1)
Basophils Relative: 0 % (ref 0–1)
Eosinophils Absolute: 0.1 10*3/uL (ref 0.0–0.7)
Eosinophils Relative: 1 % (ref 0–5)
HEMATOCRIT: 41.5 % (ref 36.0–46.0)
HEMOGLOBIN: 14.3 g/dL (ref 12.0–15.0)
LYMPHS ABS: 2.9 10*3/uL (ref 0.7–4.0)
LYMPHS PCT: 29 % (ref 12–46)
MCH: 32.1 pg (ref 26.0–34.0)
MCHC: 34.5 g/dL (ref 30.0–36.0)
MCV: 93 fL (ref 78.0–100.0)
MONO ABS: 0.8 10*3/uL (ref 0.1–1.0)
MONOS PCT: 8 % (ref 3–12)
NEUTROS ABS: 6.1 10*3/uL (ref 1.7–7.7)
NEUTROS PCT: 62 % (ref 43–77)
Platelets: 267 10*3/uL (ref 150–400)
RBC: 4.46 MIL/uL (ref 3.87–5.11)
RDW: 14.2 % (ref 11.5–15.5)
WBC: 9.8 10*3/uL (ref 4.0–10.5)

## 2013-10-29 LAB — APTT: APTT: 29 s (ref 24–37)

## 2013-10-29 LAB — SURGICAL PCR SCREEN
MRSA, PCR: NEGATIVE
Staphylococcus aureus: NEGATIVE

## 2013-10-29 LAB — TYPE AND SCREEN
ABO/RH(D): O POS
ANTIBODY SCREEN: NEGATIVE

## 2013-10-29 MED ORDER — CHLORHEXIDINE GLUCONATE 4 % EX LIQD: 60.0000 mL | Freq: Once | CUTANEOUS | Status: DC

## 2013-10-29 NOTE — Progress Notes (Signed)
Left a message on Wendy's voicemail that pt has a PCN allergy-does he still want to continue with Ancef 3gm ?

## 2013-10-29 NOTE — Pre-Procedure Instructions (Signed)
[name] [name]  10/29/2013   Your procedure is scheduled on:  Thurs, Feb 12 @ 10:00 AM  [facility]  at 8:00 AM.  Call this number if you have problems the morning of surgery: [phone]   Remember:   Do not eat food or drink liquids after midnight.   Take these medicines the morning of surgery with A SIP OF WATER: Albuterol<Bring Your Inhaler With You>,Pain Pill(if needed),and Viibryd(Vilazodone)                 Stop taking your Fish Oil. No Goody's,BC's,Aleve,Aspirin,Ibuprofen,or any Herbal Medications   Do not wear jewelry, make-up or nail polish.  Do not wear lotions, powders, or perfumes. You may wear deodorant.  Do not shave 48 hours prior to surgery.   Do not bring valuables to the hospital.  [facility] is not responsible                  for any belongings or valuables.               Contacts, dentures or bridgework may not be worn into surgery.  Leave suitcase in the car. After surgery it may be brought to your room.  For patients admitted to the hospital, discharge time is determined by your                treatment team.               Special Instructions: Shower using CHG 2 nights before surgery and the night before surgery.  If you shower the day of surgery use CHG.  Use special wash - you have one bottle of CHG for all showers.  You should use approximately 1/3 of the bottle for each shower.   Please read over the following fact sheets that you were given: Pain Booklet, Coughing and Deep Breathing, Blood Transfusion Information and Surgical Site Infection Prevention

## 2013-10-29 NOTE — Progress Notes (Signed)
10/29/13 1258  OBSTRUCTIVE SLEEP APNEA  Have you ever been diagnosed with sleep apnea through a sleep study? No  Do you snore loudly (loud enough to be heard through closed doors)?  1  Do you often feel tired, fatigued, or sleepy during the daytime? 0  Has anyone observed you stop breathing during your sleep? 1  Do you have, or are you being treated for high blood pressure? 1  BMI more than 35 kg/m2? 0  Age over 61 years old? 1  Neck circumference greater than 40 cm/18 inches? 0  Gender: 0  Obstructive Sleep Apnea Score 4  Score 4 or greater  Results sent to PCP

## 2013-10-29 NOTE — Progress Notes (Signed)
Pt doesn't have a cardiologist  Stress test done at least 20+yrs ago  Echo report in epic from 2012  Denies ever having a heart cath  EKG and CXR in epic from 3--7-14  Medical [place]

## 2013-10-29 NOTE — Progress Notes (Signed)
Returned phone call received that per French Anaracy PA with Dr.Supple Ancef still ok to use

## 2013-11-07 MED ORDER — LACTATED RINGERS IV SOLN
INTRAVENOUS | Status: DC
  Administered 2013-11-08: 09:00:00 via INTRAVENOUS

## 2013-11-07 MED ORDER — CEFAZOLIN SODIUM-DEXTROSE 2-3 GM-% IV SOLR
2.0000 g | INTRAVENOUS | Status: AC
  Administered 2013-11-08: 2 g via INTRAVENOUS
  Filled 2013-11-07: qty 50

## 2013-11-08 ENCOUNTER — Encounter (HOSPITAL_COMMUNITY): Payer: Self-pay | Admitting: *Deleted

## 2013-11-08 ENCOUNTER — Encounter (HOSPITAL_COMMUNITY)
Admission: RE | Disposition: A | Discharge status: Home Health | Payer: Self-pay | Source: Ambulatory Visit | Attending: Orthopedic Surgery

## 2013-11-08 ENCOUNTER — Inpatient Hospital Stay (HOSPITAL_COMMUNITY)
Admission: RE | Admit: 2013-11-08 | Discharge: 2013-11-09 | DRG: 483 | Disposition: A | Discharge status: Home Health | Payer: Medicaid Other | Source: Ambulatory Visit | Attending: Orthopedic Surgery | Admitting: Orthopedic Surgery

## 2013-11-08 ENCOUNTER — Encounter (HOSPITAL_COMMUNITY): Payer: Medicaid Other | Admitting: Anesthesiology

## 2013-11-08 ENCOUNTER — Ambulatory Visit (HOSPITAL_COMMUNITY): Payer: Medicaid Other | Admitting: Anesthesiology

## 2013-11-08 DIAGNOSIS — X58XXXA Exposure to other specified factors, initial encounter: Secondary | ICD-10-CM | POA: Diagnosis present

## 2013-11-08 DIAGNOSIS — F17200 Nicotine dependence, unspecified, uncomplicated: Secondary | ICD-10-CM | POA: Diagnosis present

## 2013-11-08 DIAGNOSIS — S43429A Sprain of unspecified rotator cuff capsule, initial encounter: Principal | ICD-10-CM | POA: Diagnosis present

## 2013-11-08 DIAGNOSIS — I10 Essential (primary) hypertension: Secondary | ICD-10-CM | POA: Diagnosis present

## 2013-11-08 DIAGNOSIS — Z79899 Other long term (current) drug therapy: Secondary | ICD-10-CM

## 2013-11-08 DIAGNOSIS — F411 Generalized anxiety disorder: Secondary | ICD-10-CM | POA: Diagnosis present

## 2013-11-08 DIAGNOSIS — I5030 Unspecified diastolic (congestive) heart failure: Secondary | ICD-10-CM | POA: Diagnosis present

## 2013-11-08 DIAGNOSIS — F329 Major depressive disorder, single episode, unspecified: Secondary | ICD-10-CM | POA: Diagnosis present

## 2013-11-08 DIAGNOSIS — J45909 Unspecified asthma, uncomplicated: Secondary | ICD-10-CM | POA: Diagnosis present

## 2013-11-08 DIAGNOSIS — I509 Heart failure, unspecified: Secondary | ICD-10-CM | POA: Diagnosis present

## 2013-11-08 DIAGNOSIS — F3289 Other specified depressive episodes: Secondary | ICD-10-CM | POA: Diagnosis present

## 2013-11-08 DIAGNOSIS — E669 Obesity, unspecified: Secondary | ICD-10-CM | POA: Diagnosis present

## 2013-11-08 DIAGNOSIS — K7460 Unspecified cirrhosis of liver: Secondary | ICD-10-CM | POA: Diagnosis present

## 2013-11-08 DIAGNOSIS — Z96619 Presence of unspecified artificial shoulder joint: Secondary | ICD-10-CM

## 2013-11-08 HISTORY — PX: REVERSE SHOULDER ARTHROPLASTY: SHX5054

## 2013-11-08 SURGERY — ARTHROPLASTY, SHOULDER, TOTAL, REVERSE
Anesthesia: Regional | Site: Shoulder | Laterality: Right

## 2013-11-08 MED ORDER — DOCUSATE SODIUM 100 MG PO CAPS
100.0000 mg | ORAL_CAPSULE | Freq: Two times a day (BID) | ORAL | Status: DC
  Administered 2013-11-08 – 2013-11-09 (×2): 100 mg via ORAL
  Filled 2013-11-08 (×2): qty 1

## 2013-11-08 MED ORDER — FENTANYL CITRATE 0.05 MG/ML IJ SOLN: 25.0000 ug | INTRAMUSCULAR | Status: DC | PRN

## 2013-11-08 MED ORDER — GLYCOPYRROLATE 0.2 MG/ML IJ SOLN
INTRAMUSCULAR | Status: DC | PRN
  Administered 2013-11-08: .6 mg via INTRAVENOUS

## 2013-11-08 MED ORDER — ONDANSETRON HCL 4 MG/2ML IJ SOLN
INTRAMUSCULAR | Status: DC | PRN
  Administered 2013-11-08: 4 mg via INTRAVENOUS

## 2013-11-08 MED ORDER — DIPHENHYDRAMINE HCL 12.5 MG/5ML PO ELIX
12.5000 mg | ORAL_SOLUTION | ORAL | Status: DC | PRN
Start: 2013-11-08 — End: 2013-11-09

## 2013-11-08 MED ORDER — GLYCOPYRROLATE 0.2 MG/ML IJ SOLN
INTRAMUSCULAR | Status: AC
  Filled 2013-11-08: qty 3

## 2013-11-08 MED ORDER — LACTATED RINGERS IV SOLN
INTRAVENOUS | Status: DC | PRN
  Administered 2013-11-08 (×2): via INTRAVENOUS

## 2013-11-08 MED ORDER — FENTANYL CITRATE 0.05 MG/ML IJ SOLN
INTRAMUSCULAR | Status: AC
  Administered 2013-11-08: 100 ug
  Filled 2013-11-08: qty 2

## 2013-11-08 MED ORDER — CEFAZOLIN SODIUM 1-5 GM-% IV SOLN
1.0000 g | Freq: Four times a day (QID) | INTRAVENOUS | Status: AC
  Administered 2013-11-08 – 2013-11-09 (×3): 1 g via INTRAVENOUS
  Filled 2013-11-08 (×3): qty 50

## 2013-11-08 MED ORDER — FENTANYL CITRATE 0.05 MG/ML IJ SOLN
INTRAMUSCULAR | Status: AC
  Filled 2013-11-08: qty 5

## 2013-11-08 MED ORDER — MIDAZOLAM HCL 2 MG/2ML IJ SOLN
INTRAMUSCULAR | Status: AC
  Administered 2013-11-08: 1 mg
  Filled 2013-11-08: qty 2

## 2013-11-08 MED ORDER — METOCLOPRAMIDE HCL 10 MG PO TABS: 5.0000 mg | ORAL_TABLET | Freq: Three times a day (TID) | ORAL | Status: DC | PRN

## 2013-11-08 MED ORDER — OXYCODONE-ACETAMINOPHEN 5-325 MG PO TABS
1.0000 | ORAL_TABLET | ORAL | Status: DC | PRN
  Administered 2013-11-08 – 2013-11-09 (×3): 2 via ORAL
  Administered 2013-11-09: 1 via ORAL
  Filled 2013-11-08 (×4): qty 2
  Filled 2013-11-08: qty 1

## 2013-11-08 MED ORDER — PHENYLEPHRINE HCL 10 MG/ML IJ SOLN
10.0000 mg | INTRAVENOUS | Status: DC | PRN
  Administered 2013-11-08: 15 ug/min via INTRAVENOUS

## 2013-11-08 MED ORDER — ALBUTEROL SULFATE HFA 108 (90 BASE) MCG/ACT IN AERS: 2.0000 | INHALATION_SPRAY | RESPIRATORY_TRACT | Status: DC | PRN

## 2013-11-08 MED ORDER — HYDROMORPHONE HCL PF 1 MG/ML IJ SOLN
0.2500 mg | INTRAMUSCULAR | Status: DC | PRN
  Administered 2013-11-08 – 2013-11-09 (×3): 1 mg via INTRAVENOUS
  Filled 2013-11-08 (×3): qty 1

## 2013-11-08 MED ORDER — LISINOPRIL 10 MG PO TABS
10.0000 mg | ORAL_TABLET | Freq: Every day | ORAL | Status: DC
  Administered 2013-11-09: 10 mg via ORAL
  Filled 2013-11-08: qty 1

## 2013-11-08 MED ORDER — FLEET ENEMA 7-19 GM/118ML RE ENEM: 1.0000 | ENEMA | Freq: Once | RECTAL | Status: AC | PRN

## 2013-11-08 MED ORDER — SODIUM CHLORIDE 0.9 % IR SOLN
Status: DC | PRN
  Administered 2013-11-08: 1000 mL

## 2013-11-08 MED ORDER — LIDOCAINE HCL (CARDIAC) 20 MG/ML IV SOLN
INTRAVENOUS | Status: AC
  Filled 2013-11-08: qty 5

## 2013-11-08 MED ORDER — LACTATED RINGERS IV SOLN
INTRAVENOUS | Status: DC
  Administered 2013-11-08: 12:00:00 via INTRAVENOUS

## 2013-11-08 MED ORDER — ALUM & MAG HYDROXIDE-SIMETH 200-200-20 MG/5ML PO SUSP: 30.0000 mL | ORAL | Status: DC | PRN

## 2013-11-08 MED ORDER — NEOSTIGMINE METHYLSULFATE 1 MG/ML IJ SOLN
INTRAMUSCULAR | Status: DC | PRN
  Administered 2013-11-08: 4 mg via INTRAVENOUS

## 2013-11-08 MED ORDER — ONDANSETRON HCL 4 MG/2ML IJ SOLN
INTRAMUSCULAR | Status: AC
  Filled 2013-11-08: qty 2

## 2013-11-08 MED ORDER — METOCLOPRAMIDE HCL 5 MG/ML IJ SOLN: 5.0000 mg | Freq: Three times a day (TID) | INTRAMUSCULAR | Status: DC | PRN

## 2013-11-08 MED ORDER — ONDANSETRON HCL 4 MG/2ML IJ SOLN: 4.0000 mg | Freq: Four times a day (QID) | INTRAMUSCULAR | Status: DC | PRN

## 2013-11-08 MED ORDER — ACETAMINOPHEN 325 MG PO TABS: 650.0000 mg | ORAL_TABLET | Freq: Four times a day (QID) | ORAL | Status: DC | PRN

## 2013-11-08 MED ORDER — VILAZODONE HCL 40 MG PO TABS: 40.0000 mg | ORAL_TABLET | Freq: Every day | ORAL | Status: DC

## 2013-11-08 MED ORDER — PHENOL 1.4 % MT LIQD: 1.0000 | OROMUCOSAL | Status: DC | PRN

## 2013-11-08 MED ORDER — NEOSTIGMINE METHYLSULFATE 1 MG/ML IJ SOLN
INTRAMUSCULAR | Status: AC
  Filled 2013-11-08: qty 10

## 2013-11-08 MED ORDER — POLYETHYLENE GLYCOL 3350 17 G PO PACK: 17.0000 g | PACK | Freq: Every day | ORAL | Status: DC | PRN

## 2013-11-08 MED ORDER — ONDANSETRON HCL 4 MG PO TABS: 4.0000 mg | ORAL_TABLET | Freq: Four times a day (QID) | ORAL | Status: DC | PRN

## 2013-11-08 MED ORDER — BUPIVACAINE-EPINEPHRINE PF 0.5-1:200000 % IJ SOLN
INTRAMUSCULAR | Status: DC | PRN
  Administered 2013-11-08: 30 mL via PERINEURAL

## 2013-11-08 MED ORDER — ALBUTEROL SULFATE (2.5 MG/3ML) 0.083% IN NEBU: 2.5000 mg | INHALATION_SOLUTION | RESPIRATORY_TRACT | Status: DC | PRN

## 2013-11-08 MED ORDER — DIAZEPAM 5 MG PO TABS: 2.5000 mg | ORAL_TABLET | Freq: Four times a day (QID) | ORAL | Status: DC | PRN

## 2013-11-08 MED ORDER — ROCURONIUM BROMIDE 50 MG/5ML IV SOLN
INTRAVENOUS | Status: AC
  Filled 2013-11-08: qty 1

## 2013-11-08 MED ORDER — BISACODYL 5 MG PO TBEC: 5.0000 mg | DELAYED_RELEASE_TABLET | Freq: Every day | ORAL | Status: DC | PRN

## 2013-11-08 MED ORDER — KETOROLAC TROMETHAMINE 15 MG/ML IJ SOLN
15.0000 mg | Freq: Four times a day (QID) | INTRAMUSCULAR | Status: DC
  Administered 2013-11-08 – 2013-11-09 (×4): 15 mg via INTRAVENOUS
  Filled 2013-11-08 (×7): qty 1

## 2013-11-08 MED ORDER — MENTHOL 3 MG MT LOZG: 1.0000 | LOZENGE | OROMUCOSAL | Status: DC | PRN

## 2013-11-08 MED ORDER — PROPOFOL 10 MG/ML IV BOLUS
INTRAVENOUS | Status: AC
  Filled 2013-11-08: qty 20

## 2013-11-08 MED ORDER — PROPOFOL 10 MG/ML IV BOLUS
INTRAVENOUS | Status: DC | PRN
  Administered 2013-11-08: 150 mg via INTRAVENOUS

## 2013-11-08 MED ORDER — ACETAMINOPHEN 650 MG RE SUPP: 650.0000 mg | Freq: Four times a day (QID) | RECTAL | Status: DC | PRN

## 2013-11-08 MED ORDER — FENTANYL CITRATE 0.05 MG/ML IJ SOLN
INTRAMUSCULAR | Status: DC | PRN
  Administered 2013-11-08 (×2): 50 ug via INTRAVENOUS

## 2013-11-08 MED ORDER — ROCURONIUM BROMIDE 100 MG/10ML IV SOLN
INTRAVENOUS | Status: DC | PRN
  Administered 2013-11-08: 35 mg via INTRAVENOUS

## 2013-11-08 MED ORDER — VILAZODONE HCL 40 MG PO TABS
40.0000 mg | ORAL_TABLET | Freq: Every day | ORAL | Status: DC
  Administered 2013-11-09: 40 mg via ORAL
  Filled 2013-11-08: qty 1

## 2013-11-08 MED ORDER — LIDOCAINE HCL (CARDIAC) 20 MG/ML IV SOLN
INTRAVENOUS | Status: DC | PRN
  Administered 2013-11-08: 80 mg via INTRAVENOUS

## 2013-11-08 SURGICAL SUPPLY — 69 items
BIT DRILL 170X2.5X (BIT) IMPLANT
BIT DRL 170X2.5X (BIT)
BLADE SAW SGTL 83.5X18.5 (BLADE) ×2 IMPLANT
BRUSH FEMORAL CANAL (MISCELLANEOUS) IMPLANT
CAPT SHOULD DELTAXTEND CEM MOD ×2 IMPLANT
CEMENT BONE DEPUY (Cement) ×4 IMPLANT
CLOTH BEACON ORANGE TIMEOUT ST (SAFETY) ×2 IMPLANT
CLSR STERI-STRIP ANTIMIC 1/2X4 (GAUZE/BANDAGES/DRESSINGS) ×2 IMPLANT
COVER SURGICAL LIGHT HANDLE (MISCELLANEOUS) ×2 IMPLANT
DRAPE INCISE IOBAN 66X45 STRL (DRAPES) ×2 IMPLANT
DRAPE SURG 17X11 SM STRL (DRAPES) ×2 IMPLANT
DRAPE U-SHAPE 47X51 STRL (DRAPES) ×2 IMPLANT
DRILL 2.5 (BIT)
DRILL BIT 7/64X5 (BIT) ×2 IMPLANT
DRSG AQUACEL AG ADV 3.5X10 (GAUZE/BANDAGES/DRESSINGS) ×2 IMPLANT
DRSG MEPILEX BORDER 4X8 (GAUZE/BANDAGES/DRESSINGS) IMPLANT
DURAPREP 26ML APPLICATOR (WOUND CARE) ×4 IMPLANT
ELECT BLADE 4.0 EZ CLEAN MEGAD (MISCELLANEOUS) ×2
ELECT CAUTERY BLADE 6.4 (BLADE) ×2 IMPLANT
ELECT REM PT RETURN 9FT ADLT (ELECTROSURGICAL) ×2
ELECTRODE BLDE 4.0 EZ CLN MEGD (MISCELLANEOUS) ×1 IMPLANT
ELECTRODE REM PT RTRN 9FT ADLT (ELECTROSURGICAL) ×1 IMPLANT
FACESHIELD LNG OPTICON STERILE (SAFETY) ×6 IMPLANT
GLOVE BIO SURGEON STRL SZ7.5 (GLOVE) ×2 IMPLANT
GLOVE BIO SURGEON STRL SZ8 (GLOVE) ×2 IMPLANT
GLOVE EUDERMIC 7 POWDERFREE (GLOVE) ×2 IMPLANT
GLOVE SS BIOGEL STRL SZ 7.5 (GLOVE) ×1 IMPLANT
GLOVE SUPERSENSE BIOGEL SZ 7.5 (GLOVE) ×1
GOWN STRL NON-REIN LRG LVL3 (GOWN DISPOSABLE) IMPLANT
GOWN STRL REIN XL XLG (GOWN DISPOSABLE) ×4 IMPLANT
HANDPIECE INTERPULSE COAX TIP (DISPOSABLE)
KIT BASIN OR (CUSTOM PROCEDURE TRAY) ×2 IMPLANT
KIT ROOM TURNOVER OR (KITS) ×2 IMPLANT
MANIFOLD NEPTUNE II (INSTRUMENTS) ×2 IMPLANT
NDL SUT 6 .5 CRC .975X.05 MAYO (NEEDLE) IMPLANT
NEEDLE HYPO 25GX1X1/2 BEV (NEEDLE) IMPLANT
NEEDLE MAYO TAPER (NEEDLE)
NS IRRIG 1000ML POUR BTL (IV SOLUTION) ×2 IMPLANT
PACK SHOULDER (CUSTOM PROCEDURE TRAY) ×2 IMPLANT
PAD ARMBOARD 7.5X6 YLW CONV (MISCELLANEOUS) ×4 IMPLANT
PASSER SUT SWANSON 36MM LOOP (INSTRUMENTS) IMPLANT
PIN GUIDE 1.2 (PIN) IMPLANT
PIN GUIDE GLENOPHERE 1.5MX300M (PIN) IMPLANT
PIN METAGLENE 2.5 (PIN) IMPLANT
PRESSURIZER FEMORAL UNIV (MISCELLANEOUS) IMPLANT
SET HNDPC FAN SPRY TIP SCT (DISPOSABLE) IMPLANT
SLING ARM LRG ADULT FOAM STRAP (SOFTGOODS) IMPLANT
SLING ARM XL FOAM STRAP (SOFTGOODS) ×2 IMPLANT
SPONGE LAP 18X18 X RAY DECT (DISPOSABLE) ×4 IMPLANT
SPONGE LAP 4X18 X RAY DECT (DISPOSABLE) IMPLANT
STRIP CLOSURE SKIN 1/2X4 (GAUZE/BANDAGES/DRESSINGS) ×2 IMPLANT
SUCTION FRAZIER TIP 10 FR DISP (SUCTIONS) ×2 IMPLANT
SUT BONE WAX W31G (SUTURE) IMPLANT
SUT FIBERWIRE #2 38 T-5 BLUE (SUTURE)
SUT MNCRL AB 3-0 PS2 18 (SUTURE) ×2 IMPLANT
SUT VIC AB 1 CT1 27 (SUTURE) ×2
SUT VIC AB 1 CT1 27XBRD ANBCTR (SUTURE) ×1 IMPLANT
SUT VIC AB 2-0 CT1 27 (SUTURE) ×1
SUT VIC AB 2-0 CT1 TAPERPNT 27 (SUTURE) ×1 IMPLANT
SUT VIC AB 2-0 SH 27 (SUTURE)
SUT VIC AB 2-0 SH 27X BRD (SUTURE) IMPLANT
SUTURE FIBERWR #2 38 T-5 BLUE (SUTURE) IMPLANT
SYR 30ML SLIP (SYRINGE) ×2 IMPLANT
SYR CONTROL 10ML LL (SYRINGE) IMPLANT
TOWEL OR 17X24 6PK STRL BLUE (TOWEL DISPOSABLE) ×2 IMPLANT
TOWEL OR 17X26 10 PK STRL BLUE (TOWEL DISPOSABLE) ×2 IMPLANT
TOWER CARTRIDGE SMART MIX (DISPOSABLE) IMPLANT
TRAY FOLEY CATH 16FRSI W/METER (SET/KITS/TRAYS/PACK) IMPLANT
WATER STERILE IRR 1000ML POUR (IV SOLUTION) ×2 IMPLANT

## 2013-11-08 NOTE — Anesthesia Postprocedure Evaluation (Signed)
Anesthesia Post Note  Patient: [name] [name] DrummerSylvia J Cappuccio  Procedure(s) Performed: Procedure(s) (LRB): RIGHT REVERSE SHOULDER ARTHROPLASTY (Right)  Anesthesia type: General  Patient location: PACU  Post pain: Pain level controlled and Adequate analgesia  Post assessment: Post-op Vital signs reviewed, Patient's Cardiovascular Status Stable, Respiratory Function Stable, Patent Airway and Pain level controlled  Last Vitals:  Filed Vitals:   11/08/13 1230  BP: 133/75  Pulse: 81  Temp:   Resp: 20    Post vital signs: Reviewed and stable  Level of consciousness: awake, alert  and oriented  Complications: No apparent anesthesia complications

## 2013-11-08 NOTE — Progress Notes (Signed)
MD on call notified regarding significant increase in drainage noted on surgical dressing that was not present on arrival to floor.  Patient was also noted to have a large area of bruising/hematoma underneath her arm that was not present on arrival to the floor.  MD made aware.  Will continue to monitor closely and reinforce dressing as needed.

## 2013-11-08 NOTE — Anesthesia Preprocedure Evaluation (Addendum)
Anesthesia Evaluation  Patient identified by MRN, date of birth, ID band Patient awake    Reviewed: Allergy & Precautions, H&P , NPO status , Patient's Chart, lab work & pertinent test results  Airway Mallampati: II  Neck ROM: full    Dental   Pulmonary asthma , COPDCurrent Smoker,          Cardiovascular hypertension, +CHF     Neuro/Psych  Headaches, Anxiety Depression    GI/Hepatic (+) Cirrhosis -    substance abuse  alcohol use,   Endo/Other  obese  Renal/GU      Musculoskeletal  (+) Arthritis -,   Abdominal   Peds  Hematology   Anesthesia Other Findings   Reproductive/Obstetrics                          Anesthesia Physical Anesthesia Plan  ASA: III  Anesthesia Plan: General and Regional   Post-op Pain Management: MAC Combined w/ Regional for Post-op pain   Induction: Intravenous  Airway Management Planned: Oral ETT  Additional Equipment:   Intra-op Plan:   Post-operative Plan: Extubation in OR  Informed Consent: I have reviewed the patients History and Physical, chart, labs and discussed the procedure including the risks, benefits and alternatives for the proposed anesthesia with the patient or authorized representative who has indicated his/her understanding and acceptance.     Plan Discussed with: CRNA, Anesthesiologist and Surgeon  Anesthesia Plan Comments:         Anesthesia Quick Evaluation

## 2013-11-08 NOTE — Anesthesia Procedure Notes (Signed)
Anesthesia Regional Block:  Interscalene brachial plexus block  Pre-Anesthetic Checklist: ,, timeout performed, Correct Patient, Correct Site, Correct Laterality, Correct Procedure, Correct Position, site marked, Risks and benefits discussed,  Surgical consent,  Pre-op evaluation,  At surgeon's request and post-op pain management  Laterality: Right  Prep: chloraprep       Needles:  Injection technique: Single-shot  Needle Type: Echogenic Stimulator Needle     Needle Length: 5cm 5 cm Needle Gauge: 22 and 22 G    Additional Needles:  Procedures: ultrasound guided (picture in chart) and nerve stimulator Interscalene brachial plexus block  Nerve Stimulator or Paresthesia:  Response: biceps flexion, 0.45 mA,   Additional Responses:   Narrative:  Start time: 11/08/2013 9:24 AM End time: 11/08/2013 9:37 AM Injection made incrementally with aspirations every 5 mL.  Performed by: Personally  Anesthesiologist: Dr Chaney MallingHodierne  Additional Notes: Functioning IV was confirmed and monitors were applied.  A 50mm 22ga Arrow echogenic stimulator needle was used. Sterile prep and drape,hand hygiene and sterile gloves were used.  Negative aspiration and negative test dose prior to incremental administration of local anesthetic. The patient tolerated the procedure well.  Ultrasound guidance: relevent anatomy identified, needle position confirmed, local anesthetic spread visualized around nerve(s), vascular puncture avoided.  Image printed for medical record.

## 2013-11-08 NOTE — Preoperative (Signed)
Beta Blockers   Reason not to administer Beta Blockers:Not Applicable 

## 2013-11-08 NOTE — Progress Notes (Signed)
Utilization review completed.  

## 2013-11-08 NOTE — Transfer of Care (Signed)
Immediate Anesthesia Transfer of Care Note  Patient: [name] [name]  Procedure(s) Performed: Procedure(s): RIGHT REVERSE SHOULDER ARTHROPLASTY (Right)  Patient Location: PACU  Anesthesia Type:General and Regional  Level of Consciousness: awake, alert  and oriented  Airway & Oxygen Therapy: Patient Spontanous Breathing  Post-op Assessment: Report given to PACU RN  Post vital signs: Reviewed and stable  Complications: No apparent anesthesia complications

## 2013-11-08 NOTE — Discharge Instructions (Signed)
° °  Kevin M. Supple, M.D., F.A.A.O.S. °Orthopaedic Surgery °Specializing in Arthroscopic and Reconstructive °Surgery of the Shoulder and Knee °[phone] °[address] - [place], [place] [address] - Fax [phone] ° ° °POST-OP TOTAL SHOULDER REPLACEMENT/SHOULDER HEMIARTHROPLASTY INSTRUCTIONS ° °1. Call the office at [phone] to schedule your first post-op appointment 10-14 days from the date of your surgery. ° °2. The bandage over your incision is waterproof. You may begin showering with this dressing on. You may leave this dressing on until first follow up appointment within 2 weeks. If you would like to remove it you may do so after the 5th day. Go slow and tug at the borders gently to break the bond the dressing has with the skin. The steri strips may come off with the dressing. At this point if there is no drainage it is okay to go without a bandage or you may cover it with a light guaze and tape. Leave the steri-strips in place over your incision. You can expect drainage that is bloody or yellow in nature that should gradually decrease from day of surgery. Change your dressing daily until drainage is completely resolved, then you may feel free to go without a bandage. You can also expect significant bruising around your shoulder that will drift down your arm and into your chest wall. This is very normal and should resolve over several days. ° ° 3. Wear your sling/immobilizer at all times except to perform the exercises below or to occasionally let your arm dangle by your side to stretch your elbow. You also need to sleep in your sling immobilizer until instructed otherwise. ° °4. Range of motion to your elbow, wrist, and hand are encouraged 3-5 times daily. Exercise to your hand and fingers helps to reduce swelling you may experience. ° °5. Utilize ice to the shoulder 3-5 times minimum a day and additionally if you are experiencing pain. ° °6. Prescriptions for a pain medication and a muscle  relaxant are provided for you. It is recommended that if you are experiencing pain that you pain medication alone is not controlling, add the muscle relaxant along with the pain medication which can give additional pain relief. The first 1-2 days is generally the most severe of your pain and then should gradually decrease. As your pain lessens it is recommended that you decrease your use of the pain medications to an "as needed basis'" only and to always comply with the recommended dosages of the pain medications. ° °7. Pain medications can produce constipation along with their use. If you experience this, the use of an over the counter stool softener or laxative daily is recommended.  ° °8. For most patients, if insurance allows, home health services to include therapy has been arranged. ° °9. For additional questions or concerns, please do not hesitate to call the office. If after hours there is an answering service to forward your concerns to the physician on call. ° °POST-OP EXERCISES ° °Pendulum Exercises ° °Perform pendulum exercises while standing and bending at the waist. Support your uninvolved arm on a table or chair and allow your operated arm to hang freely. Make sure to do these exercises passively - not using you shoulder muscles. ° °Repeat 20 times. Do 3 sessions per day. ° ° ° ° °

## 2013-11-08 NOTE — Op Note (Signed)
11/08/2013  11:59 AM  PATIENT:   [name] MatarSylvia J [name]  63 y.o. female  PRE-OPERATIVE DIAGNOSIS:  RIGHT SHOULDER ROTATOR CUFF TEAR, ARTHOPATHY  POST-OPERATIVE DIAGNOSIS:  same  PROCEDURE:  Right reverse shoulder arthroplasty #12 stem, +6 poly, 38 eccentric glenosphere  SURGEON:  Jomel Whittlesey, Vania ReaKevin M. M.D.  ASSISTANTS: Shuford pac   ANESTHESIA:   GET + ISB  EBL: 250  SPECIMEN:  none  Drains: none   PATIENT DISPOSITION:  PACU - hemodynamically stable.    PLAN OF CARE: Admit to inpatient   Dictation# [id number]

## 2013-11-08 NOTE — H&P (Signed)
[name] [name]    Chief Complaint: RIGHT SHOULDER ROTATOR CUFF TEAR, ARTHOPATHY HPI: The patient is a 63 y.o. 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 Respiratory failure with hypoxia   . Systemic inflammatory response syndrome   . Diastolic congestive heart failure   . ETOH abuse   . Anxiety   . Arthritis   . Family history of anesthesia complication     sister gets sick with anesthesia  . HTN (hypertension)     takes Lisinopril daily  . Asthma     uses Albuterol inhaler as needed  . Pneumonia     about 2 yrs ago  . Cirrhosis   . Headache(784.0)     occasionally  . Weakness     numbness and tingling right hand  . Joint pain   . Joint swelling   . Urinary frequency   . Nocturia   . Anemia     hx of-yrs ago  . Depression     takes Viibryd daily    Past Surgical History  Procedure Laterality Date  . Cesarean section    . Tubal ligation    . Cholecystectomy    . Cystectomy      both ears  . Total abdominal hysterectomy    . Dilation and curettage of uterus    . Cataract surgery Bilateral     Family History  Problem Relation Age of Onset  . Heart disease Father   . Lung cancer Father   . Lung cancer Mother   . Diabetes Father   . Asthma Father   . Alcoholism Father     Social History:  reports that she has been smoking Cigarettes.  She has a 43 pack-year smoking history. She has never used smokeless tobacco. She reports that she drinks alcohol. She reports that she uses illicit drugs.  Allergies:  Allergies  Allergen Reactions  . Adhesive [Tape]     Itching and break out  . Penicillins     unknown  . Codeine Itching and Rash    Medications Prior to Admission  Medication Sig Dispense Refill  . albuterol (PROAIR HFA) 108 (90 BASE) MCG/ACT inhaler Inhale 2 puffs into the lungs every 4 (four) hours as needed. : For Wheezing/shortness of breath      . fish oil-omega-3 fatty acids 1000  MG capsule Take 1 g by mouth daily.      Marland Kitchen HYDROcodone-acetaminophen (NORCO/VICODIN) 5-325 MG per tablet Take 1 tablet by mouth daily as needed for moderate pain.      . Liniments (SALONPAS) PADS Apply 1 each topically daily as needed (for pain).      Marland Kitchen lisinopril (PRINIVIL,ZESTRIL) 10 MG tablet Take 10 mg by mouth daily.      . Multiple Vitamin (MULTIVITAMIN WITH MINERALS) TABS Take 1 tablet by mouth daily.  30 tablet  0  . Vilazodone HCl (VIIBRYD) 40 MG TABS Take 40 mg by mouth daily.         Physical Exam: right shoulder with painful and restricted motion as noted at recent office visits  Vitals  Temp:  [98.2 F (36.8 C)] 98.2 F (36.8 C) (02/12 0827) Pulse Rate:  [81-95] 86 (02/12 0910) Resp:  [20-21] 21 (02/12 0910) BP: (116-152)/(54-88) 116/54 mmHg (02/12 0910) SpO2:  [97 %-99 %] 99 % (02/12 0910)  Assessment/Plan  Impression: RIGHT SHOULDER ROTATOR CUFF TEAR, ARTHOPATHY  Plan of Action: Procedure(s): RIGHT REVERSE SHOULDER ARTHROPLASTY  Malvern Kadlec M 11/08/2013, 9:30 AM

## 2013-11-09 ENCOUNTER — Encounter (HOSPITAL_COMMUNITY): Payer: Self-pay | Admitting: Orthopedic Surgery

## 2013-11-09 MED ORDER — DIAZEPAM 5 MG PO TABS: 2.5000 mg | ORAL_TABLET | Freq: Four times a day (QID) | ORAL | Status: DC | PRN

## 2013-11-09 MED ORDER — OXYCODONE-ACETAMINOPHEN 5-325 MG PO TABS: 1.0000 | ORAL_TABLET | ORAL | Status: DC | PRN

## 2013-11-09 NOTE — Care Management Note (Signed)
CARE MANAGEMENT NOTE 11/09/2013  Patient:  [name] [name],[name] [name]   Account Number:  [id number]  Date Initiated:  11/09/2013  Documentation initiated by:  Vance PeperBRADY,Mylinda Brook  Subjective/Objective Assessment:   63 yr old female s/p right total shoulder arthroplasty.     Action/Plan:   Patient has no home health or DME needs at this time.   Anticipated DC Date:  11/09/2013   Anticipated DC Plan:  HOME/SELF CARE      DC Planning Services  CM consult      Choice offered to / List presented to:     DME arranged  NA        HH arranged  NA      Status of service:  Completed, signed off Medicare Important Message given?   (If response is "NO", the following Medicare IM given date fields will be blank) Date Medicare IM given:   Date Additional Medicare IM given:    Discharge Disposition:  HOME/SELF CARE

## 2013-11-09 NOTE — Evaluation (Signed)
Occupational Therapy Evaluation and Discharge Patient Details Name: [name] [name] MRN: [id number] DOB: [birth year] Today's Date: 11/09/2013 Time: [id number] OT Time Calculation (min): 35 min  OT Assessment / Plan / Recommendation History of present illness Right reverse shoulder arthroplasty    Clinical Impression   This 63 yo female admitted and underwent above presents to acute OT with all education completed, will D/C from acute OT.    OT Assessment  Progress rehab of shoulder as ordered by MD at follow-up appointment          Equipment Recommendations  None recommended by OT          Precautions / Restrictions Precautions Precautions: Shoulder Shoulder Interventions: Shoulder sling/immobilizer;At all times;Off for dressing/bathing/exercises Required Braces or Orthoses: Sling Restrictions Weight Bearing Restrictions: Yes RUE Weight Bearing: Non weight bearing   Pertinent Vitals/Pain C/o mild pain post exercises and ADLs.       Acute Rehab OT Goals Patient Stated Goal: to go home  Visit Information  Last OT Received On: 11/09/13 Assistance Needed: +1 History of Present Illness: Right reverse shoulder arthroplasty        Prior Functioning     Home Living Family/patient expects to be discharged to:: Private residence Living Arrangements:  (family) Available Help at Discharge: Family Type of Home: House Home Equipment: None Prior Function Level of Independence: Independent Communication Communication: No difficulties Dominant Hand: Right         Vision/Perception Vision - History Patient Visual Report: No change from baseline   Cognition  Cognition Arousal/Alertness: Awake/alert Behavior During Therapy: WFL for tasks assessed/performed Overall Cognitive Status: Within Functional Limits for tasks assessed    Extremity/Trunk Assessment Upper Extremity Assessment Upper Extremity Assessment: RUE deficits/detail RUE Deficits / Details: Shoulder  surgery this admission RUE Coordination: decreased gross motor     Mobility Bed Mobility Overal bed mobility: Modified Independent Transfers Overall transfer level: Modified independent     Exercise Other Exercises Other Exercises: Pt/daughter instructed in Dr. Dub MikesSupple's reverse total shoulder protocol exercises and returned demonstrated. Also did elbow flexion/extension. Aware she needs to do them 5x/day 10 reps each Donning/doffing shirt without moving shoulder: Modified independent Method for sponge bathing under operated UE: Modified independent Donning/doffing sling/immobilizer: Modified independent Correct positioning of sling/immobilizer: Independent Pendulum exercises (written home exercise program): Min-guard ROM for elbow, wrist and digits of operated UE: Independent Sling wearing schedule (on at all times/off for ADL's): Independent Proper positioning of operated UE when showering: Independent Dressing change:  (NA) Positioning of UE while sleeping: Independent      End of Session OT - End of Session Equipment Utilized During Treatment:  (sling) Activity Tolerance: Patient tolerated treatment well Patient left: with family/visitor present (sitting EOB)       [name] [name], [name] [name] [phone] 11/09/2013, 2:02 PM

## 2013-11-09 NOTE — Op Note (Signed)
NAME:  [name] [name], [name] [name]              ACCOUNT NO.:  [id number]  MEDICAL RECORD NO.:  [id number]  LOCATION:  [id number]                        FACILITY:  [facility]  PHYSICIAN:  Vania ReaKevin M. Khalie Wince, M.D.  DATE OF BIRTH:  04-Mar-1951  DATE OF PROCEDURE:  11/08/2013 DATE OF DISCHARGE:                              OPERATIVE REPORT   PREOPERATIVE DIAGNOSIS:  End-stage right shoulder rotator cuff tear arthropathy.  POSTOPERATIVE DIAGNOSIS:  End-stage right shoulder rotator cuff tear arthropathy.  PROCEDURE:  Right reverse shoulder arthroplasty utilizing a cemented size 12 DePuy stem, +6 polyethylene insert and a 38 eccentric glenosphere.  SURGEON:  Vania ReaKevin M. Kingsley Farace, M.D.  Threasa HeadsASSISTANFrench Ana:  Tracy A. [name], P.A.-C.  ANESTHESIA:  General endotracheal as well as an interscalene block.  ESTIMATED BLOOD LOSS:  250 mL.  DRAINS:  None.  HISTORY:  [name] [name] is a 63 year old female who has had chronic and progressive increasing right shoulder pain related to an end-stage rotator cuff tear arthropathy.  Due to her increasing pain and functional limitations and failure to respond to prolonged attempts at conservative management, she was brought to the operating room at this time for planned right reverse shoulder arthroplasty.  Preoperatively, I counseled [name] [name] on treatment options as well as risks versus benefits thereof.  Possible surgical complications were reviewed including the potential for bleeding, infection, neurovascular injury, persistent pain, loss of motion, anesthetic complication, failure of the implant and possible need for additional surgery.  She understands and accepts and agrees to the planned procedure.  PROCEDURE IN DETAIL:  After undergoing routine preop evaluation, the patient did receive prophylactic antibiotics.  Interscalene block was established in the holding area by the Anesthesia Department.  Placed supine on the operating table, underwent smooth induction of a  general endotracheal anesthesia.  Placed into beach-chair position and appropriately padded and protected.  The right shoulder girdle region was sterilely prepped and draped in standard fashion.  Time-out was called.  An anterior approach to the right shoulder was made through a 12-cm deltopectoral incision.  Skin flaps were elevated and electrocautery was used for hemostasis.  The deltopectoral interval was identified with cephalic vein retracted laterally with deltoid, pectoralis major retracted medially and upper centimeter of the pectoralis major then was tenotomized to enhance exposure.  We divided adhesions beneath the deltoid and also mobilized the conjoined tendon and retracted this medially.  There had been previous ruptured long head of the biceps tendon.  The subscapularis was divided away from the attachment to the lesser tuberosity and this tissue was markedly attenuated, and did not appear as though it had been significantly functioning, but we did preserve the tissue for possible later repair. The superior aspect of the humeral head was completely denuded of rotator tissue.  We divided the capsule from the inferior aspect of the humeral head, delivered the head through the wound and then gained access to the humeral medullary canal, which I hand reamed up to size 12.  Using an intramedullary guide, then performed our humeral head resection at the appropriate level at 0 degrees of retroversion with an oscillating saw.  At this point, we placed a metal cap over the cut proximal humeral surface and then we  exposed the glenoid with a combination of pitchfork, snake tongue and Fukuda retractors.  We did a circumferential labral excision and gained complete exposure of the periphery of the glenoid.  A guidepin was then placed at the center of the glenoid and I reamed with the 38 reamer to a subchondral bony bed. The peripheral margin of the glenoid was then carefully cleaned and  all soft tissue and bony prominences were removed and smoothed.  At this point, the central drill hole was then drilled and our glenoid baseplate was then impacted into position with excellent fit.  Placed an inferior and a posterior locking screw.  The anterior hole did not have appropriate bony purchase.  Once the screws were placed, we then perform the terminal locking of the inferior and superior screw and all looked achieved good purchase.  At this point, we placed a 38 eccentric glenosphere onto the glenoid baseplate, and impacted and tightened this terminally with excellent pin fixation.  Once this was completed, we returned our attention to the proximal humerus, which we prepared using the size 12 reaming guide and reamed the metaphyseal portion of the proximal humerus at 0 degrees of retroversion with the 12 x 1 reamer. We then performed the trial reduction and this showed overall good soft tissue balance.  At this point, a distal cement plug was then placed into the humeral canal.  The canal was irrigated, dried.  Cement was mixed at its the appropriate consistency.  It was introduced into the humeral canal in retrograde fashion, compressed and then the size 12 stem was introduced into the humeral canal and impacted and maintaining approximately 0 degrees of retroversion.  All extra cement was meticulously removed.  After the cement had completely hardened, we performed the series of trial reductions and the +6 polyethylene showed the best soft tissue balance.  The final +6 polyethylene was then impacted into position.  The final reduction was performed.  There was excellent soft tissue balance, good shoulder motion and good stability. The wound was then copiously irrigated.  The subscap remnant was then repaired back to the metaphysis of the proximal humerus using a pair of #2 FiberWire sutures.  Took care to confirm the shoulder could easily be externally rotated to 45 degrees  with this repair.  The deltopectoral interval was then reapproximated with #1 Vicryl.  Hemostasis was then obtained.  Final irrigation completed.  Subcu layer was closed with 2-0 Vicryl and intracuticular 3-0 Monocryl for the skin followed by Steri- Strips.  Dry dressing was placed over the right shoulder.  Right arm was placed in a sling immobilizer.  The patient was awakened, extubated, and taken to the recovery room in stable condition.  Ralene Bathe, PA-C was used as an Geophysicist/field seismologist throughout this case, essential for help with positioning of the patient, positioning of the extremity, management of the retractors, tissue manipulation, implantation of the prostheses, wound closer, and intraoperative decision making.     Vania Rea. Yarelly Kuba, M.D.     KMS/MEDQ  D:  11/08/2013  T:  11/09/2013  Job:  [id number]

## 2013-11-09 NOTE — Care Management Note (Signed)
CARE MANAGEMENT NOTE 11/09/2013  Patient:  [name] [name],[name] [name]   Account Number:  [id number]  Date Initiated:  11/09/2013  Documentation initiated by:  Vance PeperBRADY,Nancylee Gaines  Subjective/Objective Assessment:   63 yr old female s/p right total shoulder arthroplasty.     Action/Plan:   CM spoke with patient concerning Home Health.PA has ordered HH. Patient's daughter states they use [facility]   Anticipated DC Date:  11/09/2013   Anticipated DC Plan:  HOME W HOME HEALTH SERVICES      DC Planning Services  CM consult      [facility]   Choice offered to / List presented to:  C-1 Patient   DME arranged  NA        HH arranged  HH-2 PT      HH agency  [facility]   Status of service:  Completed, signed off Medicare Important Message given?   (If response is "NO", the following Medicare IM given date fields will be blank) Date Medicare IM given:   Date Additional Medicare IM given:    Discharge Disposition:  HOME W HOME HEALTH SERVICES

## 2013-11-09 NOTE — Discharge Summary (Signed)
PATIENT ID:      [name] [name]  MRN:     [id number] DOB/AGE:    [birth year] / 63 y.o.     DISCHARGE SUMMARY  ADMISSION DATE:    11/08/2013 DISCHARGE DATE:    ADMISSION DIAGNOSIS: RIGHT SHOULDER ROTATOR CUFF TEAR, ARTHOPATHY Past Medical History  Diagnosis Date  . Respiratory failure with hypoxia   . Systemic inflammatory response syndrome   . Diastolic congestive heart failure   . ETOH abuse   . Anxiety   . Arthritis   . Family history of anesthesia complication     sister gets sick with anesthesia  . HTN (hypertension)     takes Lisinopril daily  . Asthma     uses Albuterol inhaler as needed  . Pneumonia     about 2 yrs ago  . Cirrhosis   . Headache(784.0)     occasionally  . Weakness     numbness and tingling right hand  . Joint pain   . Joint swelling   . Urinary frequency   . Nocturia   . Anemia     hx of-yrs ago  . Depression     takes Viibryd daily    DISCHARGE DIAGNOSIS:   Active Problems:   S/P shoulder replacement   PROCEDURE: Procedure(s): RIGHT REVERSE SHOULDER ARTHROPLASTY on 11/08/2013  CONSULTS:   none  HISTORY:  See H&P in chart.  HOSPITAL COURSE:  [name] [name] is a 63 y.o. admitted on 11/08/2013 with a chief complaint of right shoulder pain and dysfunction, and found to have a diagnosis of RIGHT SHOULDER ROTATOR CUFF TEAR, ARTHOPATHY.  They were brought to the operating room on 11/08/2013 and underwent Procedure(s): RIGHT REVERSE SHOULDER ARTHROPLASTY.    They were given perioperative antibiotics: Anti-infectives   Start     Dose/Rate Route Frequency Ordered Stop   11/08/13 1630  ceFAZolin (ANCEF) IVPB 1 g/50 mL premix     1 g 100 mL/hr over 30 Minutes Intravenous Every 6 hours 11/08/13 1451 11/09/13 0422   11/08/13 0600  ceFAZolin (ANCEF) IVPB 2 g/50 mL premix     2 g 100 mL/hr over 30 Minutes Intravenous On call to O.R. 11/07/13 1431 11/08/13 1033    .  Patient underwent the above named procedure and tolerated it well. The  following day they were hemodynamically stable and pain was controlled on oral analgesics. They were neurovascularly intact to the operative extremity. OT was ordered and worked with patient per protocol. They were medically and orthopaedically stable for discharge on day 1. Home health was ordered for care at home per protocol  DIAGNOSTIC STUDIES:  RECENT RADIOGRAPHIC STUDIES :  No results found.  RECENT VITAL SIGNS:  Patient Vitals for the past 24 hrs:  BP Temp Temp src Pulse Resp SpO2 Height Weight  11/09/13 0638 140/85 mmHg 98.6 F (37 C) - 82 18 99 % - -  11/08/13 2051 136/82 mmHg 98.7 F (37.1 C) - 85 18 99 % - -  11/08/13 1500 - - - - - - 5\' 1"  (1.549 m) 76.7 kg (169 lb 1.5 oz)  11/08/13 1430 132/84 mmHg 98.1 F (36.7 C) Oral 90 16 94 % - -  11/08/13 1400 - - - 59 - 76 % - -  11/08/13 1345 122/70 mmHg - - 84 21 100 % - -  11/08/13 1330 - - - 82 21 91 % - -  11/08/13 1315 112/75 mmHg - - 84 20 90 % - -  11/08/13 1300  129/82 mmHg - - 80 20 88 % - -  11/08/13 1245 131/81 mmHg - - 82 17 98 % - -  11/08/13 1230 133/75 mmHg - - 81 20 97 % - -  11/08/13 1215 147/87 mmHg 98.4 F (36.9 C) - 84 19 99 % - -  11/08/13 1008 - - - 91 18 97 % - -  11/08/13 1006 93/62 mmHg - - 91 27 96 % - -  11/08/13 1001 86/36 mmHg - - 91 21 97 % - -  11/08/13 1000 - - - 90 22 98 % - -  11/08/13 0956 80/49 mmHg - - 94 25 95 % - -  11/08/13 0954 - - - 91 24 98 % - -  11/08/13 0951 98/54 mmHg - - 89 23 97 % - -  11/08/13 0948 - - - 87 21 97 % - -  11/08/13 0946 88/51 mmHg - - 52 15 96 % - -  11/08/13 0945 - - - 82 33 98 % - -  11/08/13 0943 - - - 78 18 98 % - -  11/08/13 0941 104/50 mmHg - - 78 17 98 % - -  11/08/13 0938 101/57 mmHg - - 81 20 98 % - -  11/08/13 0936 - - - 84 22 98 % - -  11/08/13 0934 - - - 83 19 100 % - -  11/08/13 0932 - - - 86 22 99 % - -  11/08/13 0930 127/78 mmHg - - 87 20 99 % - -  11/08/13 0920 112/66 mmHg - - 82 18 99 % - -  11/08/13 0910 116/54 mmHg - - 86 21 99 % - -   11/08/13 0901 129/88 mmHg - - 81 20 98 % - -  11/08/13 0900 - - - 86 20 97 % - -  .  RECENT EKG RESULTS:    Orders placed during the hospital encounter of 12/01/12  . EKG 12-LEAD  . EKG 12-LEAD  . ED EKG  . EKG    DISCHARGE INSTRUCTIONS:    DISCHARGE MEDICATIONS:     Medication List    STOP taking these medications       HYDROcodone-acetaminophen 5-325 MG per tablet  Commonly known as:  NORCO/VICODIN      TAKE these medications       albuterol 108 (90 BASE) MCG/ACT inhaler  Commonly known as:  PROAIR HFA  Inhale 2 puffs into the lungs every 4 (four) hours as needed. : For Wheezing/shortness of breath     diazepam 5 MG tablet  Commonly known as:  VALIUM  Take 0.5-1 tablets (2.5-5 mg total) by mouth every 6 (six) hours as needed for muscle spasms or sedation.     fish oil-omega-3 fatty acids 1000 MG capsule  Take 1 g by mouth daily.     lisinopril 10 MG tablet  Commonly known as:  PRINIVIL,ZESTRIL  Take 10 mg by mouth daily.     multivitamin with minerals Tabs tablet  Take 1 tablet by mouth daily.     oxyCODONE-acetaminophen 5-325 MG per tablet  Commonly known as:  PERCOCET  Take 1-2 tablets by mouth every 4 (four) hours as needed.     SALONPAS Pads  Apply 1 each topically daily as needed (for pain).     VIIBRYD 40 MG Tabs  Generic drug:  Vilazodone HCl  Take 40 mg by mouth daily.        FOLLOW UP VISIT:  Follow-up Information   Follow up with Senaida Lange, MD. (call to be seen in 10-14 days)    Specialty:  Orthopedic Surgery   Contact information:   [address] [place] [address] [phone]       DISCHARGE TO: Home  DISPOSITION: Good  DISCHARGE CONDITION:  Rodolph Bong for Dr. Francena Hanly 11/09/2013, 8:47 AM

## 2014-03-07 ENCOUNTER — Emergency Department (HOSPITAL_COMMUNITY)
Admission: EM | Admit: 2014-03-07 | Discharge: 2014-03-07 | Disposition: A | Discharge status: Home / Self Care | Payer: Medicaid Other | Source: Home / Self Care

## 2014-06-24 ENCOUNTER — Encounter (HOSPITAL_COMMUNITY): Payer: Self-pay | Admitting: Emergency Medicine

## 2014-06-24 ENCOUNTER — Emergency Department (HOSPITAL_COMMUNITY): Payer: Medicaid Other

## 2014-06-24 ENCOUNTER — Inpatient Hospital Stay (HOSPITAL_COMMUNITY)
Admission: EM | Admit: 2014-06-24 | Discharge: 2014-06-27 | DRG: 897 | Disposition: A | Discharge status: Home / Self Care | Payer: Medicaid Other | Attending: Internal Medicine | Admitting: Internal Medicine

## 2014-06-24 DIAGNOSIS — K760 Fatty (change of) liver, not elsewhere classified: Secondary | ICD-10-CM | POA: Diagnosis present

## 2014-06-24 DIAGNOSIS — Z79899 Other long term (current) drug therapy: Secondary | ICD-10-CM | POA: Diagnosis not present

## 2014-06-24 DIAGNOSIS — R350 Frequency of micturition: Secondary | ICD-10-CM | POA: Diagnosis present

## 2014-06-24 DIAGNOSIS — I10 Essential (primary) hypertension: Secondary | ICD-10-CM | POA: Diagnosis present

## 2014-06-24 DIAGNOSIS — J441 Chronic obstructive pulmonary disease with (acute) exacerbation: Secondary | ICD-10-CM

## 2014-06-24 DIAGNOSIS — R209 Unspecified disturbances of skin sensation: Secondary | ICD-10-CM

## 2014-06-24 DIAGNOSIS — R740 Nonspecific elevation of levels of transaminase and lactic acid dehydrogenase [LDH]: Secondary | ICD-10-CM | POA: Diagnosis present

## 2014-06-24 DIAGNOSIS — F329 Major depressive disorder, single episode, unspecified: Secondary | ICD-10-CM | POA: Diagnosis present

## 2014-06-24 DIAGNOSIS — I5032 Chronic diastolic (congestive) heart failure: Secondary | ICD-10-CM | POA: Diagnosis present

## 2014-06-24 DIAGNOSIS — F10930 Alcohol use, unspecified with withdrawal, uncomplicated: Secondary | ICD-10-CM

## 2014-06-24 DIAGNOSIS — Z720 Tobacco use: Secondary | ICD-10-CM | POA: Diagnosis not present

## 2014-06-24 DIAGNOSIS — J9611 Chronic respiratory failure with hypoxia: Secondary | ICD-10-CM | POA: Diagnosis present

## 2014-06-24 DIAGNOSIS — Z91048 Other nonmedicinal substance allergy status: Secondary | ICD-10-CM | POA: Diagnosis not present

## 2014-06-24 DIAGNOSIS — Z885 Allergy status to narcotic agent status: Secondary | ICD-10-CM | POA: Diagnosis not present

## 2014-06-24 DIAGNOSIS — J80 Acute respiratory distress syndrome: Secondary | ICD-10-CM

## 2014-06-24 DIAGNOSIS — Z811 Family history of alcohol abuse and dependence: Secondary | ICD-10-CM

## 2014-06-24 DIAGNOSIS — Z880 Allergy status to penicillin: Secondary | ICD-10-CM | POA: Diagnosis not present

## 2014-06-24 DIAGNOSIS — F10939 Alcohol use, unspecified with withdrawal, unspecified: Secondary | ICD-10-CM

## 2014-06-24 DIAGNOSIS — Z7952 Long term (current) use of systemic steroids: Secondary | ICD-10-CM | POA: Diagnosis not present

## 2014-06-24 DIAGNOSIS — K7030 Alcoholic cirrhosis of liver without ascites: Secondary | ICD-10-CM | POA: Diagnosis present

## 2014-06-24 DIAGNOSIS — Z825 Family history of asthma and other chronic lower respiratory diseases: Secondary | ICD-10-CM

## 2014-06-24 DIAGNOSIS — R351 Nocturia: Secondary | ICD-10-CM | POA: Diagnosis present

## 2014-06-24 DIAGNOSIS — Z8249 Family history of ischemic heart disease and other diseases of the circulatory system: Secondary | ICD-10-CM

## 2014-06-24 DIAGNOSIS — F10239 Alcohol dependence with withdrawal, unspecified: Secondary | ICD-10-CM

## 2014-06-24 DIAGNOSIS — R51 Headache: Secondary | ICD-10-CM | POA: Diagnosis not present

## 2014-06-24 DIAGNOSIS — F419 Anxiety disorder, unspecified: Secondary | ICD-10-CM | POA: Diagnosis present

## 2014-06-24 DIAGNOSIS — J45909 Unspecified asthma, uncomplicated: Secondary | ICD-10-CM | POA: Diagnosis present

## 2014-06-24 DIAGNOSIS — R5383 Other fatigue: Secondary | ICD-10-CM

## 2014-06-24 DIAGNOSIS — Z23 Encounter for immunization: Secondary | ICD-10-CM

## 2014-06-24 DIAGNOSIS — F10231 Alcohol dependence with withdrawal delirium: Principal | ICD-10-CM

## 2014-06-24 DIAGNOSIS — F17200 Nicotine dependence, unspecified, uncomplicated: Secondary | ICD-10-CM

## 2014-06-24 DIAGNOSIS — I951 Orthostatic hypotension: Secondary | ICD-10-CM

## 2014-06-24 DIAGNOSIS — F10931 Alcohol use, unspecified with withdrawal delirium: Secondary | ICD-10-CM

## 2014-06-24 DIAGNOSIS — F1020 Alcohol dependence, uncomplicated: Secondary | ICD-10-CM | POA: Diagnosis present

## 2014-06-24 DIAGNOSIS — F10230 Alcohol dependence with withdrawal, uncomplicated: Secondary | ICD-10-CM

## 2014-06-24 DIAGNOSIS — J449 Chronic obstructive pulmonary disease, unspecified: Secondary | ICD-10-CM

## 2014-06-24 LAB — BASIC METABOLIC PANEL
ANION GAP: 20 — AB (ref 5–15)
BUN: 14 mg/dL (ref 6–23)
CALCIUM: 9.4 mg/dL (ref 8.4–10.5)
CO2: 19 mEq/L (ref 19–32)
Chloride: 97 mEq/L (ref 96–112)
Creatinine, Ser: 0.53 mg/dL (ref 0.50–1.10)
GLUCOSE: 101 mg/dL — AB (ref 70–99)
Potassium: 4.3 mEq/L (ref 3.7–5.3)
SODIUM: 136 meq/L — AB (ref 137–147)

## 2014-06-24 LAB — I-STAT TROPONIN, ED: Troponin i, poc: 0.01 ng/mL (ref 0.00–0.08)

## 2014-06-24 LAB — CBC
HCT: 38.6 % (ref 36.0–46.0)
Hemoglobin: 12.7 g/dL (ref 12.0–15.0)
MCH: 31.7 pg (ref 26.0–34.0)
MCHC: 32.9 g/dL (ref 30.0–36.0)
MCV: 96.3 fL (ref 78.0–100.0)
PLATELETS: 209 10*3/uL (ref 150–400)
RBC: 4.01 MIL/uL (ref 3.87–5.11)
RDW: 14.1 % (ref 11.5–15.5)
WBC: 9 10*3/uL (ref 4.0–10.5)

## 2014-06-24 LAB — HEPATIC FUNCTION PANEL
ALBUMIN: 3.5 g/dL (ref 3.5–5.2)
ALT: 99 U/L — AB (ref 0–35)
AST: 160 U/L — ABNORMAL HIGH (ref 0–37)
Alkaline Phosphatase: 85 U/L (ref 39–117)
Bilirubin, Direct: <0.2 mg/dL (ref 0.0–0.3)
Total Bilirubin: 0.2 mg/dL — ABNORMAL LOW (ref 0.3–1.2)
Total Protein: 8.4 g/dL — ABNORMAL HIGH (ref 6.0–8.3)

## 2014-06-24 LAB — D-DIMER, QUANTITATIVE: D-Dimer, Quant: 0.62 ug/mL-FEU — ABNORMAL HIGH (ref 0.00–0.48)

## 2014-06-24 LAB — TSH: TSH: 1.47 u[IU]/mL (ref 0.350–4.500)

## 2014-06-24 LAB — PRO B NATRIURETIC PEPTIDE: PRO B NATRI PEPTIDE: 20.6 pg/mL (ref 0–125)

## 2014-06-24 LAB — T4, FREE: FREE T4: 0.89 ng/dL (ref 0.80–1.80)

## 2014-06-24 MED ORDER — TETRAHYDROZOLINE HCL 0.05 % OP SOLN
1.0000 [drp] | Freq: Every morning | OPHTHALMIC | Status: DC
  Administered 2014-06-25 – 2014-06-27 (×3): 1 [drp] via OPHTHALMIC
  Filled 2014-06-24 (×2): qty 15

## 2014-06-24 MED ORDER — FOLIC ACID 1 MG PO TABS
1.0000 mg | ORAL_TABLET | Freq: Every day | ORAL | Status: DC
  Administered 2014-06-24 – 2014-06-27 (×4): 1 mg via ORAL
  Filled 2014-06-24 (×4): qty 1

## 2014-06-24 MED ORDER — LORAZEPAM 1 MG PO TABS
1.0000 mg | ORAL_TABLET | Freq: Four times a day (QID) | ORAL | Status: DC | PRN
  Administered 2014-06-26 – 2014-06-27 (×2): 1 mg via ORAL
  Filled 2014-06-24 (×2): qty 1

## 2014-06-24 MED ORDER — LORAZEPAM 2 MG/ML IJ SOLN: 1.0000 mg | Freq: Four times a day (QID) | INTRAMUSCULAR | Status: DC | PRN

## 2014-06-24 MED ORDER — IPRATROPIUM-ALBUTEROL 0.5-2.5 (3) MG/3ML IN SOLN
3.0000 mL | Freq: Once | RESPIRATORY_TRACT | Status: AC
  Administered 2014-06-24: 3 mL via RESPIRATORY_TRACT
  Filled 2014-06-24: qty 3

## 2014-06-24 MED ORDER — ADULT MULTIVITAMIN W/MINERALS CH
1.0000 | ORAL_TABLET | Freq: Every day | ORAL | Status: DC
  Administered 2014-06-24 – 2014-06-27 (×4): 1 via ORAL
  Filled 2014-06-24 (×4): qty 1

## 2014-06-24 MED ORDER — PNEUMOCOCCAL VAC POLYVALENT 25 MCG/0.5ML IJ INJ
0.5000 mL | INJECTION | INTRAMUSCULAR | Status: AC
  Administered 2014-06-25: 0.5 mL via INTRAMUSCULAR
  Filled 2014-06-24 (×2): qty 0.5

## 2014-06-24 MED ORDER — ALBUTEROL (5 MG/ML) CONTINUOUS INHALATION SOLN: 5.0000 mg/h | INHALATION_SOLUTION | Freq: Once | RESPIRATORY_TRACT | Status: DC

## 2014-06-24 MED ORDER — LORAZEPAM 2 MG/ML IJ SOLN
1.0000 mg | Freq: Once | INTRAMUSCULAR | Status: AC
  Administered 2014-06-24: 1 mg via INTRAVENOUS
  Filled 2014-06-24: qty 1

## 2014-06-24 MED ORDER — VITAMIN B-1 100 MG PO TABS
100.0000 mg | ORAL_TABLET | Freq: Every day | ORAL | Status: DC
  Administered 2014-06-24 – 2014-06-27 (×4): 100 mg via ORAL
  Filled 2014-06-24 (×4): qty 1

## 2014-06-24 MED ORDER — THIAMINE HCL 100 MG/ML IJ SOLN
100.0000 mg | Freq: Every day | INTRAMUSCULAR | Status: DC
  Filled 2014-06-24 (×3): qty 1

## 2014-06-24 MED ORDER — CETYLPYRIDINIUM CHLORIDE 0.05 % MT LIQD
7.0000 mL | Freq: Two times a day (BID) | OROMUCOSAL | Status: DC
  Administered 2014-06-25: 7 mL via OROMUCOSAL

## 2014-06-24 MED ORDER — HEPARIN SODIUM (PORCINE) 5000 UNIT/ML IJ SOLN
5000.0000 [IU] | Freq: Three times a day (TID) | INTRAMUSCULAR | Status: DC
  Administered 2014-06-24 – 2014-06-27 (×8): 5000 [IU] via SUBCUTANEOUS
  Filled 2014-06-24 (×11): qty 1

## 2014-06-24 MED ORDER — VILAZODONE HCL 40 MG PO TABS
40.0000 mg | ORAL_TABLET | Freq: Every day | ORAL | Status: DC
  Administered 2014-06-25 – 2014-06-27 (×3): 40 mg via ORAL
  Filled 2014-06-24 (×3): qty 1

## 2014-06-24 MED ORDER — MOMETASONE FURO-FORMOTEROL FUM 100-5 MCG/ACT IN AERO
2.0000 | INHALATION_SPRAY | Freq: Two times a day (BID) | RESPIRATORY_TRACT | Status: DC
  Administered 2014-06-25 – 2014-06-27 (×5): 2 via RESPIRATORY_TRACT
  Filled 2014-06-24: qty 8.8

## 2014-06-24 MED ORDER — LORAZEPAM 1 MG PO TABS
1.0000 mg | ORAL_TABLET | Freq: Once | ORAL | Status: AC
  Administered 2014-06-24: 1 mg via ORAL
  Filled 2014-06-24: qty 1

## 2014-06-24 MED ORDER — LORAZEPAM 2 MG/ML IJ SOLN
0.0000 mg | Freq: Two times a day (BID) | INTRAMUSCULAR | Status: DC
  Administered 2014-06-26: 2 mg via INTRAVENOUS
  Filled 2014-06-24: qty 1

## 2014-06-24 MED ORDER — PREDNISONE 50 MG PO TABS: 50.0000 mg | ORAL_TABLET | Freq: Every day | ORAL | Status: DC

## 2014-06-24 MED ORDER — LORAZEPAM 2 MG/ML IJ SOLN
0.0000 mg | Freq: Four times a day (QID) | INTRAMUSCULAR | Status: AC
  Administered 2014-06-24: 1 mg via INTRAVENOUS
  Administered 2014-06-25 – 2014-06-26 (×3): 2 mg via INTRAVENOUS
  Filled 2014-06-24 (×4): qty 1

## 2014-06-24 MED ORDER — IOHEXOL 350 MG/ML SOLN
100.0000 mL | Freq: Once | INTRAVENOUS | Status: AC | PRN
  Administered 2014-06-24: 100 mL via INTRAVENOUS

## 2014-06-24 MED ORDER — ACETAMINOPHEN 500 MG PO TABS
1000.0000 mg | ORAL_TABLET | Freq: Four times a day (QID) | ORAL | Status: DC | PRN
  Administered 2014-06-27: 1000 mg via ORAL
  Filled 2014-06-24: qty 2

## 2014-06-24 MED ORDER — PREDNISONE 20 MG PO TABS
60.0000 mg | ORAL_TABLET | Freq: Once | ORAL | Status: AC
  Administered 2014-06-24: 60 mg via ORAL
  Filled 2014-06-24: qty 3

## 2014-06-24 MED ORDER — CHLORHEXIDINE GLUCONATE 0.12 % MT SOLN
15.0000 mL | Freq: Two times a day (BID) | OROMUCOSAL | Status: DC
  Administered 2014-06-24 – 2014-06-27 (×5): 15 mL via OROMUCOSAL
  Filled 2014-06-24 (×8): qty 15

## 2014-06-24 NOTE — ED Provider Notes (Signed)
Assumed care of pt from Dr. Fredderick Phenix.  Briefly, pt is presenting with pleurisy, tachycardia.  On assumption of care awaiting CT PE study.  She has a ho COPD and has had 2 weeks of symptoms.  Plan to dc home with COPD exacerbation therapy if negative.   CT scan returned unremarkable for PE, however patient maintained persistent tachycardia.  On further history the patient is a daily drinker and alcoholic with cirrhosis and her last drink was driven 24 hours ago. At this time she states that she would like inpatient admission for detoxification. She has a prior history of DTs. Consulted medicine for admission and patient admitted in stable condition.  Mirian Mo, MD 06/25/14 [phone]

## 2014-06-24 NOTE — H&P (Signed)
[facility] History and Physical  [name] [name] ZOX:[id number] DOB: 1950/10/03 DOA: 06/24/2014  Referring physician: EDP PCP: Kaleen Mask, MD   Chief Complaint: SOB   HPI: [name] [name] is a 63 y.o. female who presents to the ED for SOB.  Her SOB which has been ongoing for the past 3 weeks was ultimately determined to be due to most likely a mild COPD exacerbation.  Work up in the ED has included negative CTA for PE that was obtained due to elevated D.Dimer and persistent tachycardia.  Ultimately history taking has revealed that the patient's much more pressing issue is likely EtOH withdrawal.  The patient has a long history of EtOH abuse, last drink was last night.  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  Thankfully no evidence of PNA this time on imaging or labs nor is she in respiratory failure (satting 100% on RA including after walking).  However the patient is already showing signs of withdrawal, tremor, tachycardia, and nervousness.  Review of Systems: Systems reviewed.  As above, otherwise negative  Past Medical History  Diagnosis Date  . Respiratory failure with hypoxia   . Systemic inflammatory response syndrome   . Diastolic congestive heart failure   . ETOH abuse   . Anxiety   . Arthritis   . Family history of anesthesia complication     sister gets sick with anesthesia  . HTN (hypertension)     takes Lisinopril daily  . Asthma     uses Albuterol inhaler as needed  . Pneumonia     about 2 yrs ago  . Cirrhosis   . Headache(784.0)     occasionally  . Weakness     numbness and tingling right hand  . Joint pain   . Joint swelling   . Urinary frequency   . Nocturia   . Anemia     hx of-yrs ago  . Depression     takes Viibryd daily  . COPD (chronic obstructive pulmonary disease)    Past Surgical  History  Procedure Laterality Date  . Cesarean section    . Tubal ligation    . Cholecystectomy    . Cystectomy      both ears  . Total abdominal hysterectomy    . Dilation and curettage of uterus    . Cataract surgery Bilateral   . Reverse shoulder arthroplasty Right 11/08/2013    Procedure: RIGHT REVERSE SHOULDER ARTHROPLASTY;  Surgeon: Senaida Lange, MD;  Location: [facility] OR;  Service: Orthopedics;  Laterality: Right;   Social History:  reports that she has been smoking Cigarettes.  She has a 43 pack-year smoking history. She has never used smokeless tobacco. She reports that she drinks alcohol. She reports that she uses illicit drugs.  Allergies  Allergen Reactions  . Adhesive [Tape]     Itching and break out  . Penicillins     unknown  . Codeine Itching and Rash    Family History  Problem Relation Age of Onset  . Heart disease Father   . Lung cancer Father   . Lung cancer Mother   . Diabetes Father   . Asthma Father   . Alcoholism Father      Prior to Admission medications   Medication Sig Start Date End Date Taking? Authorizing Provider  acetaminophen (TYLENOL) 500 MG tablet  Take 1,000 mg by mouth every 6 (six) hours as needed.   Yes Historical Provider, MD  mometasone-formoterol (DULERA) 100-5 MCG/ACT AERO Inhale 2 puffs into the lungs 2 (two) times daily as needed for wheezing or shortness of breath.   Yes Historical Provider, MD  Tetrahydrozoline HCl (VISINE OP) Place 1 drop into both eyes every morning.   Yes Historical Provider, MD  Vilazodone HCl (VIIBRYD) 40 MG TABS Take 40 mg by mouth daily.   Yes Historical Provider, MD  predniSONE (DELTASONE) 50 MG tablet Take 1 tablet (50 mg total) by mouth daily. 06/24/14   Mirian Mo, MD   Physical Exam: Filed Vitals:   06/24/14 1832  BP: 147/96  Pulse:   Temp: 98.3 F (36.8 C)  Resp: 21    BP 147/96  Pulse 100  Temp(Src) 98.3 F (36.8 C) (Oral)  Resp 21  SpO2 100%  General Appearance:    Alert, oriented,  no distress, appears stated age  Head:    Normocephalic, atraumatic  Eyes:    PERRL, EOMI, sclera non-icteric        Nose:   Nares without drainage or epistaxis. Mucosa, turbinates normal  Throat:   Moist mucous membranes. Oropharynx without erythema or exudate.  Neck:   Supple. No carotid bruits.  No thyromegaly.  No lymphadenopathy.   Back:     No CVA tenderness, no spinal tenderness  Lungs:     Clear to auscultation bilaterally, without wheezes, rhonchi or rales  Chest wall:    No tenderness to palpitation  Heart:    Regular rate and rhythm without murmurs, gallops, rubs  Abdomen:     Soft, non-tender, nondistended, normal bowel sounds, no organomegaly  Genitalia:    deferred  Rectal:    deferred  Extremities:   No clubbing, cyanosis or edema.  Pulses:   2+ and symmetric all extremities  Skin:   Skin color, texture, turgor normal, no rashes or lesions  Lymph nodes:   Cervical, supraclavicular, and axillary nodes normal  Neurologic:   CNII-XII intact. Normal strength, sensation and reflexes      throughout    Labs on Admission:  Basic Metabolic Panel:  Recent Labs Lab 06/24/14 1145  NA 136*  K 4.3  CL 97  CO2 19  GLUCOSE 101*  BUN 14  CREATININE 0.53  CALCIUM 9.4   Liver Function Tests:  Recent Labs Lab 06/24/14 1747  AST 160*  ALT 99*  ALKPHOS 85  BILITOT 0.2*  PROT 8.4*  ALBUMIN 3.5   No results found for this basename: LIPASE, AMYLASE,  in the last 168 hours No results found for this basename: AMMONIA,  in the last 168 hours CBC:  Recent Labs Lab 06/24/14 1145  WBC 9.0  HGB 12.7  HCT 38.6  MCV 96.3  PLT 209   Cardiac Enzymes: No results found for this basename: CKTOTAL, CKMB, CKMBINDEX, TROPONINI,  in the last 168 hours  BNP (last 3 results)  Recent Labs  06/24/14 1145  PROBNP 20.6   CBG: No results found for this basename: GLUCAP,  in the last 168 hours  Radiological Exams on Admission: Dg Chest 2 View  06/24/2014   CLINICAL DATA:   Shortness of breath and coughing and congestion; history of CHF and COPD and long history of tobacco use  EXAM: CHEST  2 VIEW  COMPARISON:  PA and lateral chest x-ray of December 02, 2018 14  FINDINGS: The lungs are adequately inflated. There is no focal infiltrate. There are  coarse lung markings in both lower lobes not greatly changed from the previous study. There is no pleural effusion or pneumothorax. The cardiac silhouette is normal in size. The pulmonary vascularity is not engorged. There is mild tortuosity of the descending thoracic aorta. 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 No acute bony thoracic abnormality is demonstrated.  IMPRESSION: There is no acute cardiopulmonary abnormality.   Electronically Signed   By: David  [place]   On: 06/24/2014 12:38   Ct Angio Chest Pe W/cm &/or Wo Cm  06/24/2014   CLINICAL DATA:  Shortness of breath 3 weeks with elevated D-dimer. Coughing and wheezing.  EXAM: CT ANGIOGRAPHY CHEST WITH CONTRAST  TECHNIQUE: Multidetector CT imaging of the chest was performed using the standard protocol during bolus administration of intravenous contrast. Multiplanar CT image reconstructions and MIPs were obtained to evaluate the vascular anatomy.  CONTRAST:  OMNIPAQUE IOHEXOL 350 MG/ML SOLN  COMPARISON:  02/02/2011  FINDINGS: Lungs are adequately inflated and demonstrate mild centrilobular emphysematous disease over the mid to upper lungs. There is no focal consolidation or effusion. The airways are within normal. The heart is normal in size. Minimal calcified plaque over the left anterior descending coronary artery. Mild calcified plaque over the thoracic aorta. Pulmonary arterial system is well opacified as there are no pulmonary emboli identified. There is no hilar, mediastinal or axillary adenopathy.  Abdominal images demonstrate diffuse hepatic steatosis. There is stable symmetric prominence of the adrenal glands. There are mild degenerative changes of the spine. Right  shoulder prosthesis is present.  Review of the MIP images confirms the above findings.  IMPRESSION: No acute cardiopulmonary disease and no evidence of pulmonary embolism.  Mild emphysematous disease.  Hepatic steatosis.   Electronically Signed   By: Elberta Fortis M.D.   On: 06/24/2014 16:20    EKG: Independently reviewed.  Assessment/Plan Principal Problem:   Alcohol withdrawal Active Problems:   COPD (chronic obstructive pulmonary disease)   Alcohol dependence   Alcohol withdrawal delirium   COPD exacerbation   1. EtOH withdrawal - will admit for CIWA given history of DTz in the past and fact she is already tachycardic, unlikely that detox facility will accept patient at this time so will go ahead and admit to inpatient for the detox.  Scheduled IV ativan as well as PRN.  CIWA protocol. 2. Mild COPD exacerbation - adult wheeze protocol and switching her dulera to scheduled from its "PRN" dosing at home.  Not really severe enough to warrant steroids at this point though this may change.    Code Status: Full Code  Family Communication: No family in room Disposition Plan: Admit to inpatient   Time spent: 70 min  GARDNER, JARED M. [facility] Pager [phone]  If 7AM-7PM, please contact the day team taking care of the patient [email or web address] Password [facility] 06/24/2014, 7:46 PM

## 2014-06-24 NOTE — ED Notes (Signed)
Patient transported to X-ray 

## 2014-06-24 NOTE — ED Notes (Signed)
Family at bedside. 

## 2014-06-24 NOTE — ED Provider Notes (Signed)
CSN: [id number]     Arrival date & time 06/24/14  1130 History   First MD Initiated Contact with Patient 06/24/14 1308     Chief Complaint  Patient presents with  . Shortness of Breath     (Consider location/radiation/quality/duration/timing/severity/associated sxs/prior Treatment) HPI Comments: Patient presents with shortness of breath and dry cough. She states it started about 3 weeks ago. At that time she had stomatitis and saw her primary care physician who wanted to do a chest x-ray. This was denied by insurance and she did not have it done. She continues to have cough and shortness of breath, particularly on exertion. She denies a fevers or chills. She's been using an albuterol inhaler as well as a nebulizer machine at home with mild improvement of symptoms. She's had some pain in her left upper back, primarily on inspiration but denies any chest pain. She denies any leg pain or swelling. She denies a history of blood clots. She denies any fevers or chills.  Patient is a 63 y.o. female presenting with shortness of breath.  Shortness of Breath Associated symptoms: cough   Associated symptoms: no abdominal pain, no chest pain, no diaphoresis, no fever, no headaches, no rash and no vomiting     Past Medical History  Diagnosis Date  . Respiratory failure with hypoxia   . Systemic inflammatory response syndrome   . Diastolic congestive heart failure   . ETOH abuse   . Anxiety   . Arthritis   . Family history of anesthesia complication     sister gets sick with anesthesia  . HTN (hypertension)     takes Lisinopril daily  . Asthma     uses Albuterol inhaler as needed  . Pneumonia     about 2 yrs ago  . Cirrhosis   . Headache(784.0)     occasionally  . Weakness     numbness and tingling right hand  . Joint pain   . Joint swelling   . Urinary frequency   . Nocturia   . Anemia     hx of-yrs ago  . Depression     takes Viibryd daily  . COPD (chronic obstructive pulmonary  disease)    Past Surgical History  Procedure Laterality Date  . Cesarean section    . Tubal ligation    . Cholecystectomy    . Cystectomy      both ears  . Total abdominal hysterectomy    . Dilation and curettage of uterus    . Cataract surgery Bilateral   . Reverse shoulder arthroplasty Right 11/08/2013    Procedure: RIGHT REVERSE SHOULDER ARTHROPLASTY;  Surgeon: Senaida Lange, MD;  Location: [facility] OR;  Service: Orthopedics;  Laterality: Right;   Family History  Problem Relation Age of Onset  . Heart disease Father   . Lung cancer Father   . Lung cancer Mother   . Diabetes Father   . Asthma Father   . Alcoholism Father    History  Substance Use Topics  . Smoking status: Current Every Day Smoker -- 1.00 packs/day for 43 years    Types: Cigarettes  . Smokeless tobacco: Never Used     Comment: relapsed smoking june 2012  . Alcohol Use: Yes     Comment: no alcohol since 09/27/13   OB History   Grav Para Term Preterm Abortions TAB SAB Ect Mult Living                 Review of Systems  Constitutional: Negative for fever, chills, diaphoresis and fatigue.  HENT: Negative for congestion, rhinorrhea and sneezing.   Eyes: Negative.   Respiratory: Positive for cough and shortness of breath. Negative for chest tightness.   Cardiovascular: Negative for chest pain and leg swelling.  Gastrointestinal: Negative for nausea, vomiting, abdominal pain, diarrhea and blood in stool.  Genitourinary: Negative for frequency, hematuria, flank pain and difficulty urinating.  Musculoskeletal: Positive for back pain. Negative for arthralgias.  Skin: Negative for rash.  Neurological: Negative for dizziness, speech difficulty, weakness, numbness and headaches.      Allergies  Adhesive; Penicillins; and Codeine  Home Medications   Prior to Admission medications   Medication Sig Start Date End Date Taking? Authorizing Provider  acetaminophen (TYLENOL) 500 MG tablet Take 1,000 mg by mouth every  6 (six) hours as needed.   Yes Historical Provider, MD  mometasone-formoterol (DULERA) 100-5 MCG/ACT AERO Inhale 2 puffs into the lungs 2 (two) times daily as needed for wheezing or shortness of breath.   Yes Historical Provider, MD  Tetrahydrozoline HCl (VISINE OP) Place 1 drop into both eyes every morning.   Yes Historical Provider, MD  Vilazodone HCl (VIIBRYD) 40 MG TABS Take 40 mg by mouth daily.   Yes Historical Provider, MD   BP 114/72  Pulse 100  Temp(Src) 98.2 F (36.8 C) (Oral)  Resp 20  SpO2 96% Physical Exam  Constitutional: She is oriented to person, place, and time. She appears well-developed and well-nourished.  HENT:  Head: Normocephalic and atraumatic.  Eyes: Pupils are equal, round, and reactive to light.  Neck: Normal range of motion. Neck supple.  Cardiovascular: Normal rate, regular rhythm and normal heart sounds.   Pulmonary/Chest: Effort normal. No respiratory distress. She has wheezes. She has no rales. She exhibits no tenderness.  Bilateral expiratory wheezing  Abdominal: Soft. Bowel sounds are normal. There is no tenderness. There is no rebound and no guarding.  Musculoskeletal: Normal range of motion. She exhibits no edema.  No calf tenderness  Lymphadenopathy:    She has no cervical adenopathy.  Neurological: She is alert and oriented to person, place, and time.  Skin: Skin is warm and dry. No rash noted.  Psychiatric: She has a normal mood and affect.    ED Course  Procedures (including critical care time) Labs Review Labs Reviewed  BASIC METABOLIC PANEL - Abnormal; Notable for the following:    Sodium 136 (*)    Glucose, Bld 101 (*)    Anion gap 20 (*)    All other components within normal limits  CBC  PRO B NATRIURETIC PEPTIDE  D-DIMER, QUANTITATIVE  I-STAT TROPOININ, ED    Imaging Review Dg Chest 2 View  06/24/2014   CLINICAL DATA:  Shortness of breath and coughing and congestion; history of CHF and COPD and long history of tobacco use   EXAM: CHEST  2 VIEW  COMPARISON:  PA and lateral chest x-ray of December 02, 2018 14  FINDINGS: The lungs are adequately inflated. There is no focal infiltrate. There are coarse lung markings in both lower lobes not greatly changed from the previous study. There is no pleural effusion or pneumothorax. The cardiac silhouette is normal in size. The pulmonary vascularity is not engorged. There is mild tortuosity of the descending thoracic aorta. 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 No acute bony thoracic abnormality is demonstrated.  IMPRESSION: There is no acute cardiopulmonary abnormality.   Electronically Signed   By: David  [place]   On: 06/24/2014 12:38  EKG Interpretation   Date/Time:  Monday June 24 2014 11:42:17 EDT Ventricular Rate:  110 PR Interval:  153 QRS Duration: 79 QT Interval:  341 QTC Calculation: 461 R Axis:   39 Text Interpretation:  Sinus tachycardia Probable left atrial enlargement  since last tracing no significant change heart rate has increased  Confirmed by Imri Lor  MD, Iyla Balzarini ([id number]) on 06/24/2014 1:34:24 PM      MDM   Final diagnoses:  None    Patient presents with 2 to three-week history of worsening shortness of breath and cough. There is no evidence of pneumonia on chest x-ray. She had a mildly elevated d-dimer and CT angiogram still pending. She feels better after nebulizer treatment. Dr. Littie Deeds to followup on CTA and discharge as appropriate.    Rolan Bucco, MD 06/24/14 [phone]

## 2014-06-24 NOTE — ED Notes (Signed)
Per pt, states SOB for 3 weeks-saw PCP and was told to have chest xray-cant afford it-cough, wheezing-nebs not working

## 2014-06-25 DIAGNOSIS — I10 Essential (primary) hypertension: Secondary | ICD-10-CM

## 2014-06-25 LAB — CBC
HCT: 37.5 % (ref 36.0–46.0)
HEMOGLOBIN: 12.3 g/dL (ref 12.0–15.0)
MCH: 31.9 pg (ref 26.0–34.0)
MCHC: 32.8 g/dL (ref 30.0–36.0)
MCV: 97.4 fL (ref 78.0–100.0)
Platelets: 197 10*3/uL (ref 150–400)
RBC: 3.85 MIL/uL — AB (ref 3.87–5.11)
RDW: 14.5 % (ref 11.5–15.5)
WBC: 7.4 10*3/uL (ref 4.0–10.5)

## 2014-06-25 LAB — BASIC METABOLIC PANEL
Anion gap: 14 (ref 5–15)
BUN: 19 mg/dL (ref 6–23)
CALCIUM: 10 mg/dL (ref 8.4–10.5)
CO2: 25 meq/L (ref 19–32)
Chloride: 100 mEq/L (ref 96–112)
Creatinine, Ser: 0.6 mg/dL (ref 0.50–1.10)
GFR calc Af Amer: >90 mL/min (ref >90)
GFR calc non Af Amer: >90 mL/min (ref >90)
Glucose, Bld: 117 mg/dL — ABNORMAL HIGH (ref 70–99)
Potassium: 4.2 mEq/L (ref 3.7–5.3)
SODIUM: 139 meq/L (ref 137–147)

## 2014-06-25 LAB — MRSA PCR SCREENING: MRSA BY PCR: INVALID — AB

## 2014-06-25 MED ORDER — AMLODIPINE BESYLATE 5 MG PO TABS
5.0000 mg | ORAL_TABLET | Freq: Every day | ORAL | Status: DC
  Administered 2014-06-25 – 2014-06-27 (×3): 5 mg via ORAL
  Filled 2014-06-25 (×3): qty 1

## 2014-06-25 MED ORDER — ENSURE COMPLETE PO LIQD
237.0000 mL | Freq: Every day | ORAL | Status: DC
  Administered 2014-06-25 – 2014-06-26 (×2): 237 mL via ORAL

## 2014-06-25 MED ORDER — PREDNISONE 20 MG PO TABS
40.0000 mg | ORAL_TABLET | Freq: Every day | ORAL | Status: DC
  Administered 2014-06-25 – 2014-06-26 (×2): 40 mg via ORAL
  Filled 2014-06-25 (×3): qty 2

## 2014-06-25 MED ORDER — NICOTINE 21 MG/24HR TD PT24
21.0000 mg | MEDICATED_PATCH | Freq: Every day | TRANSDERMAL | Status: DC
  Administered 2014-06-25 – 2014-06-27 (×3): 21 mg via TRANSDERMAL
  Filled 2014-06-25 (×3): qty 1

## 2014-06-25 MED ORDER — IPRATROPIUM-ALBUTEROL 0.5-2.5 (3) MG/3ML IN SOLN
3.0000 mL | RESPIRATORY_TRACT | Status: DC
  Administered 2014-06-25 (×2): 3 mL via RESPIRATORY_TRACT
  Filled 2014-06-25 (×2): qty 3

## 2014-06-25 MED ORDER — ALBUTEROL SULFATE (2.5 MG/3ML) 0.083% IN NEBU: 2.5000 mg | INHALATION_SOLUTION | Freq: Four times a day (QID) | RESPIRATORY_TRACT | Status: DC | PRN

## 2014-06-25 MED ORDER — IPRATROPIUM-ALBUTEROL 0.5-2.5 (3) MG/3ML IN SOLN
3.0000 mL | Freq: Four times a day (QID) | RESPIRATORY_TRACT | Status: DC
  Administered 2014-06-26 (×3): 3 mL via RESPIRATORY_TRACT
  Filled 2014-06-25 (×3): qty 3

## 2014-06-25 MED ORDER — ALBUTEROL SULFATE (2.5 MG/3ML) 0.083% IN NEBU: 2.5000 mg | INHALATION_SOLUTION | RESPIRATORY_TRACT | Status: DC | PRN

## 2014-06-25 NOTE — Progress Notes (Addendum)
[facility] PROGRESS NOTE  [name] [name] ZOX:[id number] DOB: [birth year] DOA: 06/24/2014 PCP: Kaleen MaskELKINS,WILSON OLIVER, MD   Brief narrative  63 y/o female with ongoing tobacco use, etoh abuse, previous prolonged hospitalization for DTs with aspiration PNA and ARDS requiring ventilation , COPD with ongoing tobacco use, HTN, Hx of diastolic CHF, depression presented to ED with SOB. Patient initially admitted for mild COPD exacerbation but noted have early etoh withdrawal symptoms.    Assessment/Plan: Alcohol Abuse with early alcohol withdrawal Patient reportedly was found to have mild withdrawal symptoms on admission. Placed on CIWA. Continue thiamine, folate and multivitamin. Currently he does not have any withdrawal symptoms. She reports quitting for 2 months but again started drinking about 12 packs of beer every day. -Patient wishes to quit alcohol and seek further outpatient alcohol rehabilitation programs. Social work consulted. -Monitor for DTs. If symptoms improve could possibly be discharged home in 1-2 days  on Ativan or Librium when necessary.  Mild COPD exacerbation Continue dulera and when necessary albuterol inhaler. Will add spiriva. Will place on oral prednisone for 5 days. Prn nebs Consults on smoking cessation. We'll order a nicotine patch.  Depression Continue vybrid  Transaminitis Secondary to underlying alcoholic cirrhosis. Check ultrasound abdomen.  Hypertension Not on medications.place on low dose amlodipine  DVT Prophylaxis  Diet: Regular  Code Status: Full code Family Communication: None at bedside  Disposition Plan: Home possibly in 1-2 days   Consultants:  None  Procedures:  None  Antibiotics:  None  HPI/Subjective: Patient seen and examined. He reports some headache. Shortness of breath better  Objective: Filed Vitals:   06/25/14 1356  BP: 145/89  Pulse: 101  Temp: 98 F (36.7 C)  Resp: 18    Intake/Output Summary (Last 24  hours) at 06/25/14 1358 Last data filed at 06/25/14 0750  Gross per 24 hour  Intake    240 ml  Output      0 ml  Net    240 ml   Filed Weights   06/24/14 2053  Weight: 82.555 kg (182 lb)    Exam:   General:  Elderly female in no acute distress  HEENT: No pallor, moist oral mucosa  Chest: Scattered wheezes bilaterally, no crackles  Cardiovascular: Normal S1-S2, no murmurs  Abdomen: , Nondistended, nontender, bowel sounds present  Musculoskeletal: Warm, no edema  CNS: Alert and oriented, no tremors  Data Reviewed: Basic Metabolic Panel:  Recent Labs Lab 06/24/14 1145 06/25/14 0455  NA 136* 139  K 4.3 4.2  CL 97 100  CO2 19 25  GLUCOSE 101* 117*  BUN 14 19  CREATININE 0.53 0.60  CALCIUM 9.4 10.0   Liver Function Tests:  Recent Labs Lab 06/24/14 1747  AST 160*  ALT 99*  ALKPHOS 85  BILITOT 0.2*  PROT 8.4*  ALBUMIN 3.5   No results found for this basename: LIPASE, AMYLASE,  in the last 168 hours No results found for this basename: AMMONIA,  in the last 168 hours CBC:  Recent Labs Lab 06/24/14 1145 06/25/14 0455  WBC 9.0 7.4  HGB 12.7 12.3  HCT 38.6 37.5  MCV 96.3 97.4  PLT 209 197   Cardiac Enzymes: No results found for this basename: CKTOTAL, CKMB, CKMBINDEX, TROPONINI,  in the last 168 hours BNP (last 3 results)  Recent Labs  06/24/14 1145  PROBNP 20.6   CBG: No results found for this basename: GLUCAP,  in the last 168 hours  Recent Results (from the past 240 hour(s))  MRSA  PCR SCREENING     Status: Abnormal   Collection Time    06/24/14 11:46 PM      Result Value Ref Range Status   MRSA by PCR INVALID RESULTS, SPECIMEN SENT FOR CULTURE (*) NEGATIVE Final   Comment: RESULT CALLED TO, READ BACK BY AND VERIFIED WITH:     B.JONES,RN AT 0125 ON 06/25/14 BY SHEAW                The GeneXpert MRSA Assay (FDA     approved for NASAL specimens     only), is one component of a     comprehensive MRSA colonization     surveillance  program. It is not     intended to diagnose MRSA     infection nor to guide or     monitor treatment for     MRSA infections.     Studies: Dg Chest 2 View  06/24/2014   CLINICAL DATA:  Shortness of breath and coughing and congestion; history of CHF and COPD and long history of tobacco use  EXAM: CHEST  2 VIEW  COMPARISON:  PA and lateral chest x-ray of December 02, 2018 14  FINDINGS: The lungs are adequately inflated. There is no focal infiltrate. There are coarse lung markings in both lower lobes not greatly changed from the previous study. There is no pleural effusion or pneumothorax. The cardiac silhouette is normal in size. The pulmonary vascularity is not engorged. There is mild tortuosity of the descending thoracic aorta. 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 No acute bony thoracic abnormality is demonstrated.  IMPRESSION: There is no acute cardiopulmonary abnormality.   Electronically Signed   By: David  [place]   On: 06/24/2014 12:38   Ct Angio Chest Pe W/cm &/or Wo Cm  06/24/2014   CLINICAL DATA:  Shortness of breath 3 weeks with elevated D-dimer. Coughing and wheezing.  EXAM: CT ANGIOGRAPHY CHEST WITH CONTRAST  TECHNIQUE: Multidetector CT imaging of the chest was performed using the standard protocol during bolus administration of intravenous contrast. Multiplanar CT image reconstructions and MIPs were obtained to evaluate the vascular anatomy.  CONTRAST:  OMNIPAQUE IOHEXOL 350 MG/ML SOLN  COMPARISON:  02/02/2011  FINDINGS: Lungs are adequately inflated and demonstrate mild centrilobular emphysematous disease over the mid to upper lungs. There is no focal consolidation or effusion. The airways are within normal. The heart is normal in size. Minimal calcified plaque over the left anterior descending coronary artery. Mild calcified plaque over the thoracic aorta. Pulmonary arterial system is well opacified as there are no pulmonary emboli identified. There is no hilar, mediastinal or  axillary adenopathy.  Abdominal images demonstrate diffuse hepatic steatosis. There is stable symmetric prominence of the adrenal glands. There are mild degenerative changes of the spine. Right shoulder prosthesis is present.  Review of the MIP images confirms the above findings.  IMPRESSION: No acute cardiopulmonary disease and no evidence of pulmonary embolism.  Mild emphysematous disease.  Hepatic steatosis.   Electronically Signed   By: Elberta Fortis M.D.   On: 06/24/2014 16:20    Scheduled Meds: . antiseptic oral rinse  7 mL Mouth Rinse q12n4p  . chlorhexidine  15 mL Mouth Rinse BID  . feeding supplement (ENSURE COMPLETE)  237 mL Oral QHS  . folic acid  1 mg Oral Daily  . heparin  5,000 Units Subcutaneous 3 times per day  . LORazepam  0-4 mg Intravenous Q6H   Followed by  . [START ON 06/26/2014] LORazepam  0-4 mg Intravenous Q12H  . mometasone-formoterol  2 puff Inhalation BID  . multivitamin with minerals  1 tablet Oral Daily  . tetrahydrozoline  1 drop Both Eyes q morning - 10a  . thiamine  100 mg Oral Daily   Or  . thiamine  100 mg Intravenous Daily  . Vilazodone HCl  40 mg Oral Daily   Continuous Infusions:     Time spent: 25 minutes    Shritha Bresee  [facility] Pager [phone]. If 7PM-7AM, please contact night-coverage at [email or web address], password [facility] 06/25/2014, 1:58 PM  LOS: 1 day

## 2014-06-25 NOTE — Care Management Note (Addendum)
    Page 1 of 2   06/27/2014     2:02:42 PM CARE MANAGEMENT NOTE 06/27/2014  Patient:  [name] [name],[name] [name]   Account Number:  [id number]  Date Initiated:  06/25/2014  Documentation initiated by:  DAVIS,RHONDA  Subjective/Objective Assessment:   pt with several days of dyspnea hx of copd/but main is etoh w/d-heavy drinker and presents with tachycardiac and tremors     Action/Plan:   home when stable if cleared by med and psych   Anticipated DC Date:  06/27/2014   Anticipated DC Plan:  HOME/SELF CARE  In-house referral  Clinical Social Worker      DC Planning Services  CM consult      [facility]   Choice offered to / List presented to:  NA   DME arranged  NA      DME agency  NA     HH arranged  NA      HH agency  NA   Status of service:  Completed, signed off Medicare Important Message given?   (If response is "NO", the following Medicare IM given date fields will be blank) Date Medicare IM given:   Medicare IM given by:   Date Additional Medicare IM given:   Additional Medicare IM given by:    Discharge Disposition:  HOME/SELF CARE  Per UR Regulation:  Reviewed for med. necessity/level of care/duration of stay  If discussed at Long Length of Stay Meetings, dates discussed:    Comments:  06/27/14 [facility] Caeli Linehan RN,BSN NCM [phone] D/C HOME NO NEEDS OR ORDERS.  [id number]/Rhonda Earlene Plateravis, RN, BSN, CCM Discharge needs and chart reviewed. Discharge needs and patient's stay to be followed by case manager.

## 2014-06-25 NOTE — Progress Notes (Signed)
INITIAL NUTRITION ASSESSMENT  DOCUMENTATION CODES Per approved criteria  -Obesity Unspecified   INTERVENTION: - Ensure Complete HS - Encouraged excellent meal intake - RD to continue to monitor   NUTRITION DIAGNOSIS: Altered GI function related to alcoholism as evidenced by pt report.   Goal: Pt to consume >90% of meals/supplements  Monitor:  Weights, labs, intake  Reason for Assessment: Malnutrition screening tool   63 y.o. female  Admitting Dx: Alcohol withdrawal  ASSESSMENT: Pt presents to the ED for SOB. Her SOB which has been ongoing for the past 3 weeks was ultimately determined to be due to most likely a mild COPD exacerbation. Work up in the ED has included negative CTA for PE that was obtained due to elevated D.Dimer and persistent tachycardia.  Ultimately history taking has revealed that the patient's much more pressing issue is likely EtOH withdrawal. The patient has a long history of EtOH abuse, last drink was last night. 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 Thankfully no evidence of PNA this time on imaging or labs nor is she in respiratory failure (satting 100% on RA including after walking). However the patient is already showing signs of withdrawal, tremor, tachycardia, and nervousness per MD.  - Met with pt and daughter who report pt eating 2 meals/day - eggs for breakfast, a sandwich for lunch, sometimes eats dinner - Pt c/o 7-8 diarrhea episodes/day for the past several months but states they've been getting more formed - Pt reports drinking 12-18 beers/day for several months, reports "I'm an alcoholic"  - Said she's been gaining weight, 5 pounds recently, from drinking  - Reports she drinks 1 gallon of water/day at home - Denies any stomach pain/nausea, only c/o headaches - On CIWA protocol getting thiamine, daily MVI,  and folic acid - Observed that pt ate >50% of lunch tray (burger and some fries)  AST/ALT elevated   Nutrition Focused Physical Exam:  Subcutaneous Fat:  Orbital Region: wnl Upper Arm Region: wnl Thoracic and Lumbar Region: NA  Muscle:  Temple Region: wnl Clavicle Bone Region: wnl Clavicle and Acromion Bone Region: wnl Scapular Bone Region: NA Dorsal Hand: wnl Patellar Region: wnl Anterior Thigh Region: wnl Posterior Calf Region: wnl  Edema: None noted    Height: Ht Readings from Last 1 Encounters:  06/25/14 5' (1.524 m)    Weight: Wt Readings from Last 1 Encounters:  06/24/14 182 lb (82.555 kg)    Ideal Body Weight: 100 lbs  % Ideal Body Weight: 182%  Wt Readings from Last 10 Encounters:  06/24/14 182 lb (82.555 kg)  11/08/13 169 lb 1.5 oz (76.7 kg)  11/08/13 169 lb 1.5 oz (76.7 kg)  10/29/13 169 lb 1.5 oz (76.7 kg)  12/03/12 195 lb 1.7 oz (88.5 kg)  10/02/12 182 lb (82.555 kg)  02/21/12 177 lb (80.287 kg)  03/30/11 152 lb (68.947 kg)  02/26/11 150 lb 12.8 oz (68.402 kg)  11/12/09 178 lb (80.74 kg)    Usual Body Weight: 207 lbs per pt  % Usual Body Weight: 88%  BMI:  Body mass index is 35.54 kg/(m^2). Class II obesity   Estimated Nutritional Needs: Kcal: 1300-1500 Protein: 50-70g Fluid: 1.3-1.5L/day   Skin: intact  Diet Order: General  EDUCATION NEEDS: -No education needs identified at this time   Intake/Output Summary (Last 24 hours) at 06/25/14 1312 Last data  filed at 06/25/14 0750  Gross per 24 hour  Intake    240 ml  Output      0 ml  Net    240 ml    Last BM: PTA  Labs:   Recent Labs Lab 06/24/14 1145 06/25/14 0455  NA 136* 139  K 4.3 4.2  CL 97 100  CO2 19 25  BUN 14 19  CREATININE 0.53 0.60  CALCIUM 9.4 10.0  GLUCOSE 101* 117*    CBG (last 3)  No results found for this basename: GLUCAP,  in the last 72 hours  Scheduled Meds: . antiseptic oral rinse  7 mL Mouth Rinse q12n4p  . chlorhexidine  15 mL Mouth  Rinse BID  . folic acid  1 mg Oral Daily  . heparin  5,000 Units Subcutaneous 3 times per day  . LORazepam  0-4 mg Intravenous Q6H   Followed by  . [START ON 06/26/2014] LORazepam  0-4 mg Intravenous Q12H  . mometasone-formoterol  2 puff Inhalation BID  . multivitamin with minerals  1 tablet Oral Daily  . tetrahydrozoline  1 drop Both Eyes q morning - 10a  . thiamine  100 mg Oral Daily   Or  . thiamine  100 mg Intravenous Daily  . Vilazodone HCl  40 mg Oral Daily    Continuous Infusions:   Past Medical History  Diagnosis Date  . Respiratory failure with hypoxia   . Systemic inflammatory response syndrome   . Diastolic congestive heart failure   . ETOH abuse   . Anxiety   . Arthritis   . Family history of anesthesia complication     sister gets sick with anesthesia  . HTN (hypertension)     takes Lisinopril daily  . Asthma     uses Albuterol inhaler as needed  . Pneumonia     about 2 yrs ago  . Cirrhosis   . Headache(784.0)     occasionally  . Weakness     numbness and tingling right hand  . Joint pain   . Joint swelling   . Urinary frequency   . Nocturia   . Anemia     hx of-yrs ago  . Depression     takes Viibryd daily  . COPD (chronic obstructive pulmonary disease)     Past Surgical History  Procedure Laterality Date  . Cesarean section    . Tubal ligation    . Cholecystectomy    . Cystectomy      both ears  . Total abdominal hysterectomy    . Dilation and curettage of uterus    . Cataract surgery Bilateral   . Reverse shoulder arthroplasty Right 11/08/2013    Procedure: RIGHT REVERSE SHOULDER ARTHROPLASTY;  Surgeon: Marin Shutter, MD;  Location: [facility];  Service: Orthopedics;  Laterality: Right;    Carlis Stable MS, [place], [place] Pager [phone] Weekend/After Hours Pager

## 2014-06-26 LAB — COMPREHENSIVE METABOLIC PANEL
ALBUMIN: 3.2 g/dL — AB (ref 3.5–5.2)
ALT: 74 U/L — AB (ref 0–35)
AST: 95 U/L — AB (ref 0–37)
Alkaline Phosphatase: 70 U/L (ref 39–117)
Anion gap: 13 (ref 5–15)
BUN: 18 mg/dL (ref 6–23)
CALCIUM: 9.3 mg/dL (ref 8.4–10.5)
CO2: 24 mEq/L (ref 19–32)
Chloride: 104 mEq/L (ref 96–112)
Creatinine, Ser: 0.53 mg/dL (ref 0.50–1.10)
GFR calc Af Amer: >90 mL/min (ref >90)
GFR calc non Af Amer: >90 mL/min (ref >90)
Glucose, Bld: 103 mg/dL — ABNORMAL HIGH (ref 70–99)
Potassium: 4.1 mEq/L (ref 3.7–5.3)
SODIUM: 141 meq/L (ref 137–147)
Total Bilirubin: 0.5 mg/dL (ref 0.3–1.2)
Total Protein: 7.9 g/dL (ref 6.0–8.3)

## 2014-06-26 MED ORDER — IPRATROPIUM-ALBUTEROL 0.5-2.5 (3) MG/3ML IN SOLN
3.0000 mL | Freq: Three times a day (TID) | RESPIRATORY_TRACT | Status: DC
  Administered 2014-06-27: 3 mL via RESPIRATORY_TRACT
  Filled 2014-06-26: qty 3

## 2014-06-26 MED ORDER — NICOTINE 21 MG/24HR TD PT24: 21.0000 mg | MEDICATED_PATCH | Freq: Every day | TRANSDERMAL | Status: DC

## 2014-06-26 MED ORDER — METHYLPREDNISOLONE SODIUM SUCC 125 MG IJ SOLR
60.0000 mg | Freq: Two times a day (BID) | INTRAMUSCULAR | Status: DC
  Administered 2014-06-26 – 2014-06-27 (×2): 60 mg via INTRAVENOUS
  Filled 2014-06-26 (×6): qty 0.96

## 2014-06-26 MED ORDER — NICOTINE 10 MG IN INHA
1.0000 | RESPIRATORY_TRACT | Status: DC | PRN
Start: 2014-06-26 — End: 2014-06-26

## 2014-06-26 NOTE — Progress Notes (Signed)
Clinical Social Work Department BRIEF PSYCHOSOCIAL ASSESSMENT 06/26/2014  Patient:  [name] [name],[name] [name]     Account Number:  [id number]     Admit date:  06/24/2014  Clinical Social Worker:  Dennison BullaGERBER,Rex Oesterle, LCSW  Date/Time:  06/26/2014 10:00 AM  Referred by:  Physician  Date Referred:  06/26/2014 Referred for  Substance Abuse   Other Referral:   Interview type:  Patient Other interview type:    PSYCHOSOCIAL DATA Living Status:  FAMILY Admitted from facility:   Level of care:   Primary support name:  Melissa Primary support relationship to patient:  CHILD, ADULT Degree of support available:   Strong    CURRENT CONCERNS Current Concerns  Substance Abuse   Other Concerns:    SOCIAL WORK ASSESSMENT / PLAN CSW received referral in order to assess for substance use. Patient reports she asked to speak with CSW and is eager to complete assessment. CSW introduced myself and explained role.    Patient reports that she has been divorced from her first husband for about 30 years. Patient had remarried but divorced again in 2005. Patient's ex-husband has a brain tumor so dtr suggested that patient move back in with ex-husband to provide care for him. Patient reports she and ex-husband have a good relationship now and she enjoys living with him.    Patient reports she used to "party hard" and believes she built up a tolerance for alcohol. Patient reports she has been drinking on a daily basis for the past 3 weeks. Patient drinks anywhere from 12-24 beers a day. Patient reports she usually drinks 1-2 beers when she wakes up in the morning "to take the edge off" but never drinks during work. 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 Patient reports that when she returns home she starts drinking until she goes to bed at night. Patient reports she blacks out at times and has hallucinated in the past when withdrawing from alcohol.  Patient reports that she has been sober for 9 months in the past.    [facility], and completed Intensive Outpatient Program (IOP) at [facility]. Patient has attended AA meetings at times as well. Patient reports she became lazy and stopped going to IOP and AA meetings so she began drinking again. Patient cannot identify any triggers to drinking alcohol but reports she wants to stay sober again. CSW spoke with patient about treatment options. Patient reports she feels she has been most successful through IOP at [facility] because she is attending therapy sessions there as well as medication management for depression and anxiety.    Patient sees Dr. Mila HomerSena for medication management and Rosey Batheresa for individual [facility] at [facility]. Patient reports that she has already spoken with her therapist about re-enrolling in IOP. Patient reports a suicide attempt several years ago but denies any current SI or HI.    CSW will continue to follow and provide support throughout hospital stay.   Assessment/plan status:  [facility] Other assessment/ plan:   SBIRT   Information/referral to community resources:   [facility]  AA meetings    PATIENT'S/FAMILY'S RESPONSE TO PLAN OF CARE: Patient alert and oriented and engaged in assessment. Patient reports she has supportive family and environment but knows she needs to be more reliable and attend meetings to ensure her sobriety. Patient reports she is still  able to function on a daily basis and go to work despite her alcohol use. Patient reports she will follow up with [facility] and AA meetings. Patient had a sponsor in the past that she will contact as well. Patient appreciative of visit and agreeable for CSW to continue to follow.       [place], [place] [phone]

## 2014-06-26 NOTE — Progress Notes (Signed)
Assumed care of patient from previous RN at 00:30.  I agree with prior assessment and will continue to monitor patient.  Manson PasseyBrown, Laini Urick Cherie

## 2014-06-26 NOTE — Progress Notes (Signed)
[facility] PROGRESS NOTE  MCKAYLIE VASEY ZOX:[id number] DOB: 1951/08/19 DOA: 06/24/2014 PCP: Kaleen Mask, MD   Brief narrative  63 y/o female with ongoing tobacco use, etoh abuse, previous prolonged hospitalization for DTs with aspiration PNA and ARDS requiring ventilation , COPD with ongoing tobacco use, HTN, Hx of diastolic CHF, depression presented to ED with SOB. Patient initially admitted for mild COPD exacerbation but noted have early etoh withdrawal symptoms.    Assessment/Plan: Alcohol Abuse with early alcohol withdrawal Patient reportedly was found to have mild withdrawal symptoms on admission. Placed on CIWA. Continue thiamine, folate and multivitamin. Currently he does not have any withdrawal symptoms. She reports quitting for 2 months but again started drinking about 12 packs of beer every day. -Patient wishes to quit alcohol and seek further outpatient alcohol rehabilitation programs. Social work consulted. -Monitor for DTs. If symptoms improve could possibly be discharged home tomorrow on Ativan or Librium when necessary.  Mild COPD exacerbation Continue dulera and when necessary albuterol inhaler. Will add spiriva. Still have wheezing, so we'll change her oral prednisone to IV Solu-Medrol low-dose. Continue with Prn nebs.   Depression Continue vybrid  Transaminitis Secondary to underlying alcoholic cirrhosis. CT abdomen showing liver steatosis.  Hypertension Not on medications.place on low dose amlodipine  DVT Prophylaxis  Diet: Regular  Code Status: Full code Family Communication: None at bedside  Disposition Plan: Home possibly in 1-2 days   Consultants:  None  Procedures:  None  Antibiotics:  None  HPI/Subjective: Patient seen and examined. He reports some headache. Shortness of breath better  Objective: Filed Vitals:   06/26/14 0530  BP: 161/97  Pulse: 86  Temp: 98.4 F (36.9 C)  Resp: 20    Intake/Output Summary  (Last 24 hours) at 06/26/14 1220 Last data filed at 06/26/14 0700  Gross per 24 hour  Intake    480 ml  Output      0 ml  Net    480 ml   Filed Weights   06/24/14 2053  Weight: 82.555 kg (182 lb)    Exam:   General:  Elderly female in no acute distress  HEENT: No pallor, moist oral mucosa  Chest: Diffuse wheezes bilaterally, no crackles  Cardiovascular: Normal S1-S2, no murmurs  Abdomen: , Nondistended, nontender, bowel sounds present  Musculoskeletal: Warm, no edema  CNS: Alert and oriented, no tremors  Data Reviewed: Basic Metabolic Panel:  Recent Labs Lab 06/24/14 1145 06/25/14 0455 06/26/14 0505  NA 136* 139 141  K 4.3 4.2 4.1  CL 97 100 104  CO2 19 25 24   GLUCOSE 101* 117* 103*  BUN 14 19 18   CREATININE 0.53 0.60 0.53  CALCIUM 9.4 10.0 9.3   Liver Function Tests:  Recent Labs Lab 06/24/14 1747 06/26/14 0505  AST 160* 95*  ALT 99* 74*  ALKPHOS 85 70  BILITOT 0.2* 0.5  PROT 8.4* 7.9  ALBUMIN 3.5 3.2*   No results found for this basename: LIPASE, AMYLASE,  in the last 168 hours No results found for this basename: AMMONIA,  in the last 168 hours CBC:  Recent Labs Lab 06/24/14 1145 06/25/14 0455  WBC 9.0 7.4  HGB 12.7 12.3  HCT 38.6 37.5  MCV 96.3 97.4  PLT 209 197   Cardiac Enzymes: No results found for this basename: CKTOTAL, CKMB, CKMBINDEX, TROPONINI,  in the last 168 hours BNP (last 3 results)  Recent Labs  06/24/14 1145  PROBNP 20.6   CBG: No results found for this basename: GLUCAP,  in the last 168 hours  Recent Results (from the past 240 hour(s))  MRSA PCR SCREENING     Status: Abnormal   Collection Time    06/24/14 11:46 PM      Result Value Ref Range Status   MRSA by PCR INVALID RESULTS, SPECIMEN SENT FOR CULTURE (*) NEGATIVE Final   Comment: RESULT CALLED TO, READ BACK BY AND VERIFIED WITH:     B.JONES,RN AT 0125 ON 06/25/14 BY SHEAW                The GeneXpert MRSA Assay (FDA     approved for NASAL specimens      only), is one component of a     comprehensive MRSA colonization     surveillance program. It is not     intended to diagnose MRSA     infection nor to guide or     monitor treatment for     MRSA infections.  MRSA CULTURE     Status: None   Collection Time    06/24/14 11:46 PM      Result Value Ref Range Status   Specimen Description NOSE   Final   Special Requests NONE   Final   Culture     Final   Value: NO SUSPICIOUS COLONIES, CONTINUING TO HOLD     Performed at [facility]   Report Status PENDING   Incomplete     Studies: Ct Angio Chest Pe W/cm &/or Wo Cm  06/24/2014   CLINICAL DATA:  Shortness of breath 3 weeks with elevated D-dimer. Coughing and wheezing.  EXAM: CT ANGIOGRAPHY CHEST WITH CONTRAST  TECHNIQUE: Multidetector CT imaging of the chest was performed using the standard protocol during bolus administration of intravenous contrast. Multiplanar CT image reconstructions and MIPs were obtained to evaluate the vascular anatomy.  CONTRAST:  100mL OMNIPAQUE IOHEXOL 350 MG/ML SOLN  COMPARISON:  02/02/2011  FINDINGS: Lungs are adequately inflated and demonstrate mild centrilobular emphysematous disease over the mid to upper lungs. There is no focal consolidation or effusion. The airways are within normal. The heart is normal in size. Minimal calcified plaque over the left anterior descending coronary artery. Mild calcified plaque over the thoracic aorta. Pulmonary arterial system is well opacified as there are no pulmonary emboli identified. There is no hilar, mediastinal or axillary adenopathy.  Abdominal images demonstrate diffuse hepatic steatosis. There is stable symmetric prominence of the adrenal glands. There are mild degenerative changes of the spine. Right shoulder prosthesis is present.  Review of the MIP images confirms the above findings.  IMPRESSION: No acute cardiopulmonary disease and no evidence of pulmonary embolism.  Mild emphysematous disease.  Hepatic  steatosis.   Electronically Signed   By: Elberta Fortisaniel  Boyle M.D.   On: 06/24/2014 16:20    Scheduled Meds: . amLODipine  5 mg Oral Daily  . antiseptic oral rinse  7 mL Mouth Rinse q12n4p  . chlorhexidine  15 mL Mouth Rinse BID  . feeding supplement (ENSURE COMPLETE)  237 mL Oral QHS  . folic acid  1 mg Oral Daily  . heparin  5,000 Units Subcutaneous 3 times per day  . ipratropium-albuterol  3 mL Nebulization Q6H WA  . LORazepam  0-4 mg Intravenous Q6H   Followed by  . LORazepam  0-4 mg Intravenous Q12H  . methylPREDNISolone (SOLU-MEDROL) injection  60 mg Intravenous Q12H  . mometasone-formoterol  2 puff Inhalation BID  . multivitamin with minerals  1 tablet Oral Daily  . nicotine  21 mg Transdermal Daily  . tetrahydrozoline  1 drop Both Eyes q morning - 10a  . thiamine  100 mg Oral Daily   Or  . thiamine  100 mg Intravenous Daily  . Vilazodone HCl  40 mg Oral Daily   Continuous Infusions:     Time spent: 25 minutes    Jalan Bodi  [facility] Pager [phone]. If 7PM-7AM, please contact night-coverage at [email or web address], password [facility] 06/26/2014, 12:20 PM  LOS: 2 days

## 2014-06-27 DIAGNOSIS — J441 Chronic obstructive pulmonary disease with (acute) exacerbation: Secondary | ICD-10-CM

## 2014-06-27 LAB — MRSA CULTURE

## 2014-06-27 MED ORDER — NICOTINE 21 MG/24HR TD PT24: 21.0000 mg | MEDICATED_PATCH | Freq: Every day | TRANSDERMAL | Status: DC

## 2014-06-27 MED ORDER — THIAMINE HCL 100 MG PO TABS: 100.0000 mg | ORAL_TABLET | Freq: Every day | ORAL | Status: DC

## 2014-06-27 MED ORDER — FOLIC ACID 1 MG PO TABS: 1.0000 mg | ORAL_TABLET | Freq: Every day | ORAL | Status: AC

## 2014-06-27 MED ORDER — AZITHROMYCIN 250 MG PO TABS: ORAL_TABLET | ORAL | Status: DC

## 2014-06-27 MED ORDER — LORAZEPAM 0.5 MG PO TABS: 0.5000 mg | ORAL_TABLET | Freq: Four times a day (QID) | ORAL | Status: DC | PRN

## 2014-06-27 MED ORDER — METHYLPREDNISOLONE (PAK) 4 MG PO TABS: ORAL_TABLET | ORAL | Status: DC

## 2014-06-27 MED ORDER — AMLODIPINE BESYLATE 5 MG PO TABS: 5.0000 mg | ORAL_TABLET | Freq: Every day | ORAL | Status: DC

## 2014-06-27 MED ORDER — ADULT MULTIVITAMIN W/MINERALS CH: 1.0000 | ORAL_TABLET | Freq: Every day | ORAL | Status: DC

## 2014-06-27 NOTE — Progress Notes (Signed)
Pt IV removed.  Reviewed discharge instructions and new medication regimen.  Pt received prescriptions upon discharge.  No further questions at this time.  Pt VS stable for discharge. Justin Mendaudle, Jamela Cumbo H, RN

## 2014-06-27 NOTE — Discharge Instructions (Signed)
Chronic Obstructive Pulmonary Disease  Chronic obstructive pulmonary disease (COPD) is a common lung condition in which airflow from the lungs is limited. COPD is a general term that can be used to describe many different lung problems that limit airflow, including both chronic bronchitis and emphysema. If you have COPD, your lung function will probably never return to normal, but there are measures you can take to improve lung function and make yourself feel better.   CAUSES    Smoking (common).    Exposure to secondhand smoke.    Genetic problems.   Chronic inflammatory lung diseases or recurrent infections.  SYMPTOMS    Shortness of breath, especially with physical activity.    Deep, persistent (chronic) cough with a large amount of thick mucus.    Wheezing.    Rapid breaths (tachypnea).    Gray or bluish discoloration (cyanosis) of the skin, especially in fingers, toes, or lips.    Fatigue.    Weight loss.    Frequent infections or episodes when breathing symptoms become much worse (exacerbations).    Chest tightness.  DIAGNOSIS   Your health care provider will take a medical history and perform a physical examination to make the initial diagnosis. Additional tests for COPD may include:    Lung (pulmonary) function tests.   Chest X-ray.   CT scan.   Blood tests.  TREATMENT   Treatment available to help you feel better when you have COPD includes:    Inhaler and nebulizer medicines. These help manage the symptoms of COPD and make your breathing more comfortable.   Supplemental oxygen. Supplemental oxygen is only helpful if you have a low oxygen level in your blood.    Exercise and physical activity. These are beneficial for nearly all people with COPD. Some people may also benefit from a pulmonary rehabilitation program.  HOME CARE INSTRUCTIONS    Take all medicines (inhaled or pills) as directed by your health care provider.   Avoid over-the-counter medicines or cough syrups  that dry up your airway (such as antihistamines) and slow down the elimination of secretions unless instructed otherwise by your health care provider.    If you are a smoker, the most important thing that you can do is stop smoking. Continuing to smoke will cause further lung damage and breathing trouble. Ask your health care provider for help with quitting smoking. He or she can direct you to community resources or hospitals that provide support.   Avoid exposure to irritants such as smoke, chemicals, and fumes that aggravate your breathing.   Use oxygen therapy and pulmonary rehabilitation if directed by your health care provider. If you require home oxygen therapy, ask your health care provider whether you should purchase a pulse oximeter to measure your oxygen level at home.    Avoid contact with individuals who have a contagious illness.   Avoid extreme temperature and humidity changes.   Eat healthy foods. Eating smaller, more frequent meals and resting before meals may help you maintain your strength.   Stay active, but balance activity with periods of rest. Exercise and physical activity will help you maintain your ability to do things you want to do.   Preventing infection and hospitalization is very important when you have COPD. Make sure to receive all the vaccines your health care provider recommends, especially the pneumococcal and influenza vaccines. Ask your health care provider whether you need a pneumonia vaccine.   Learn and use relaxation techniques to manage stress.   Learn   going to whistle and breathe out (exhale) through the pursed lips for 2 seconds.   Diaphragmatic breathing. Start by putting one hand on your abdomen just above  your waist. Inhale slowly through your nose. The hand on your abdomen should move out. Then purse your lips and exhale slowly. You should be able to feel the hand on your abdomen moving in as you exhale.   Learn and use controlled coughing to clear mucus from your lungs. Controlled coughing is a series of short, progressive coughs. The steps of controlled coughing are:  1. Lean your head slightly forward.  2. Breathe in deeply using diaphragmatic breathing.  3. Try to hold your breath for 3 seconds.  4. Keep your mouth slightly open while coughing twice.  5. Spit any mucus out into a tissue.  6. Rest and repeat the steps once or twice as needed. SEEK MEDICAL CARE IF:   You are coughing up more mucus than usual.   There is a change in the color or thickness of your mucus.   Your breathing is more labored than usual.   Your breathing is faster than usual.  SEEK IMMEDIATE MEDICAL CARE IF:   You have shortness of breath while you are resting.   You have shortness of breath that prevents you from:  Being able to talk.   Performing your usual physical activities.   You have chest pain lasting longer than 5 minutes.   Your skin color is more cyanotic than usual.  You measure low oxygen saturations for longer than 5 minutes with a pulse oximeter. MAKE SURE YOU:   Understand these instructions.  Will watch your condition.  Will get help right away if you are not doing well or get worse. Document Released: 06/23/2005 Document Revised: 01/28/2014 Document Reviewed: 05/10/2013 [place] [facility] Patient Information 2015 [place], [place]. This information is not intended to replace advice given to you by your health care provider. Make sure you discuss any questions you have with your health care provider.  Follow with Primary MD Kaleen Mask, MD in 5 days   Get CBC, CMP, 2 view Chest X ray checked  by Primary MD next visit.    Activity: As tolerated with Full fall  precautions use walker/cane & assistance as needed   Disposition Home    Diet: Heart Healthy  , with feeding assistance and aspiration precautions as needed.   For Heart failure patients - Check your Weight same time everyday, if you gain over 2 pounds, or you develop in leg swelling, experience more shortness of breath or chest pain, call your Primary MD immediately. Follow Cardiac Low Salt Diet and 1.8 lit/day fluid restriction.   On your next visit with her primary care physician please Get Medicines reviewed and adjusted.  Please request your Prim.MD to go over all Hospital Tests and Procedure/Radiological results at the follow up, please get all Hospital records sent to your Prim MD by signing hospital release before you go home.   If you experience worsening of your admission symptoms, develop shortness of breath, life threatening emergency, suicidal or homicidal thoughts you must seek medical attention immediately by calling 911 or calling your MD immediately  if symptoms less severe.  You Must read complete instructions/literature along with all the possible adverse reactions/side effects for all the Medicines you take and that have been prescribed to you. Take any new Medicines after you have completely understood and accpet all the possible adverse reactions/side effects.   Do not drive, operating  heavy machinery, perform activities at heights, swimming or participation in water activities or provide baby sitting services if your were admitted for syncope or siezures until you have seen by Primary MD or a Neurologist and advised to do so again.  Do not drive when taking Pain or anxiety medicine medications.  Take extreme caution when you take as needed Ativan for alcohol withdrawal symptoms, you have to be supervised by family in the next 24-48 hours after discharge as I discussed with the patient, and she reported a family member will be present with her for the next few days  .   Do not take more than prescribed Pain, Sleep and Anxiety Medications  Special Instructions: If you have smoked or chewed Tobacco  in the last 2 yrs please stop smoking, stop any regular Alcohol  and or any Recreational drug use.  Wear Seat belts while driving.   Please note  You were cared for by a hospitalist during your hospital stay. If you have any questions about your discharge medications or the care you received while you were in the hospital after you are discharged, you can call the unit and asked to speak with the hospitalist on call if the hospitalist that took care of you is not available. Once you are discharged, your primary care physician will handle any further medical issues. Please note that NO REFILLS for any discharge medications will be authorized once you are discharged, as it is imperative that you return to your primary care physician (or establish a relationship with a primary care physician if you do not have one) for your aftercare needs so that they can reassess your need for medications and monitor your lab values.

## 2014-06-27 NOTE — Discharge Summary (Signed)
[name] [name], 63 y.o., DOB 08-29-1951, MRN [id number]. Admission date: 06/24/2014 Discharge Date 06/27/2014 Primary MD Kaleen Mask, MD Admitting Physician Hillary Bow, DO  Admission Diagnosis  Alcohol withdrawal delirium [F10.231] COPD exacerbation [J44.1] Alcohol dependence with withdrawal delirium [F10.231] Chronic obstructive pulmonary disease with acute exacerbation [J44.1] Alcohol withdrawal, with unspecified complication [F10.239]  Discharge Diagnosis   Principal Problem:   Alcohol withdrawal Active Problems:   COPD (chronic obstructive pulmonary disease)   Alcohol dependence   Alcohol withdrawal delirium   COPD exacerbation      Past Medical History  Diagnosis Date  . Respiratory failure with hypoxia   . Systemic inflammatory response syndrome   . Diastolic congestive heart failure   . ETOH abuse   . Anxiety   . Arthritis   . Family history of anesthesia complication     sister gets sick with anesthesia  . HTN (hypertension)     takes Lisinopril daily  . Asthma     uses Albuterol inhaler as needed  . Pneumonia     about 2 yrs ago  . Cirrhosis   . Headache(784.0)     occasionally  . Weakness     numbness and tingling right hand  . Joint pain   . Joint swelling   . Urinary frequency   . Nocturia   . Anemia     hx of-yrs ago  . Depression     takes Viibryd daily  . COPD (chronic obstructive pulmonary disease)     Past Surgical History  Procedure Laterality Date  . Cesarean section    . Tubal ligation    . Cholecystectomy    . Cystectomy      both ears  . Total abdominal hysterectomy    . Dilation and curettage of uterus    . Cataract surgery Bilateral   . Reverse shoulder arthroplasty Right 11/08/2013    Procedure: RIGHT REVERSE SHOULDER ARTHROPLASTY;  Surgeon: Senaida Lange, MD;  Location: [facility] OR;  Service: Orthopedics;  Laterality: Right;   Brief narrative  63 y/o female with ongoing tobacco use, etoh abuse, previous prolonged  hospitalization for DTs with aspiration PNA and ARDS requiring ventilation , COPD with ongoing tobacco use, HTN, Hx of diastolic CHF, depression presented to ED with SOB. Patient initially admitted for mild COPD exacerbation but noted have early etoh withdrawal symptoms, she was started on CIWA protocol, she did not have any major withdrawal symptoms, remained stable during her entire hospitalization, has no wheezing or oxygen demand at day of discharge.    Hospital Course See H&P, Labs, Consult and Test reports for all details in brief, patient was admitted for   Principal Problem:   Alcohol withdrawal Active Problems:   COPD (chronic obstructive pulmonary disease)   Alcohol dependence   Alcohol withdrawal delirium   COPD exacerbation  Alcohol Abuse with early alcohol withdrawal  Patient reportedly was found to have mild withdrawal symptoms on admission. Placed on CIWA. Started on thiamine, folate and multivitamin. No significant withdrawal symptoms over the last 48 hours. She reports quitting for 2 months but again started drinking about 12 packs of beer every day.  -Patient wishes to quit alcohol and she was counseled at length. -No further DT symptoms, will be discharged on as needed Ativan.  Mild COPD exacerbation  Continue dulera and when necessary albuterol inhaler. Has no significant wheezing or hypoxia at day of discharge, will be discharged on Medrol pack, and Z-Pak.   Depression  Continue vybrid  Transaminitis  Secondary to underlying alcoholic cirrhosis. CT abdomen showing liver steatosis.   Hypertension  Not on medications upon.started on low dose amlodipine .       Significant Tests:  See full reports for all details    Dg Chest 2 View  06/24/2014   CLINICAL DATA:  Shortness of breath and coughing and congestion; history of CHF and COPD and long history of tobacco use  EXAM: CHEST  2 VIEW  COMPARISON:  PA and lateral chest x-ray of December 02, 2018 14  FINDINGS:  The lungs are adequately inflated. There is no focal infiltrate. There are coarse lung markings in both lower lobes not greatly changed from the previous study. There is no pleural effusion or pneumothorax. The cardiac silhouette is normal in size. The pulmonary vascularity is not engorged. There is mild tortuosity of the descending thoracic aorta. 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 No acute bony thoracic abnormality is demonstrated.  IMPRESSION: There is no acute cardiopulmonary abnormality.   Electronically Signed   By: David  [place]   On: 06/24/2014 12:38   Ct Angio Chest Pe W/cm &/or Wo Cm  06/24/2014   CLINICAL DATA:  Shortness of breath 3 weeks with elevated D-dimer. Coughing and wheezing.  EXAM: CT ANGIOGRAPHY CHEST WITH CONTRAST  TECHNIQUE: Multidetector CT imaging of the chest was performed using the standard protocol during bolus administration of intravenous contrast. Multiplanar CT image reconstructions and MIPs were obtained to evaluate the vascular anatomy.  CONTRAST:  OMNIPAQUE IOHEXOL 350 MG/ML SOLN  COMPARISON:  02/02/2011  FINDINGS: Lungs are adequately inflated and demonstrate mild centrilobular emphysematous disease over the mid to upper lungs. There is no focal consolidation or effusion. The airways are within normal. The heart is normal in size. Minimal calcified plaque over the left anterior descending coronary artery. Mild calcified plaque over the thoracic aorta. Pulmonary arterial system is well opacified as there are no pulmonary emboli identified. There is no hilar, mediastinal or axillary adenopathy.  Abdominal images demonstrate diffuse hepatic steatosis. There is stable symmetric prominence of the adrenal glands. There are mild degenerative changes of the spine. Right shoulder prosthesis is present.  Review of the MIP images confirms the above findings.  IMPRESSION: No acute cardiopulmonary disease and no evidence of pulmonary embolism.  Mild emphysematous disease.   Hepatic steatosis.   Electronically Signed   By: Elberta Fortis M.D.   On: 06/24/2014 16:20     Today   Subjective:   [name] [name] today has no headache,no chest abdominal pain,no new weakness tingling or numbness, feels much better wants to go home today.   Objective:   Blood pressure 151/91, pulse 98, temperature 97.9 F (36.6 C), temperature source Oral, resp. rate 18, height 5' (1.524 m), weight 82.555 kg (182 lb), SpO2 94.00%.  Intake/Output Summary (Last 24 hours) at 06/27/14 1124 Last data filed at 06/27/14 0731  Gross per 24 hour  Intake    960 ml  Output      0 ml  Net    960 ml    Exam Awake Alert, Oriented *3, No new F.N deficits, Normal affect Rives.AT,PERRAL Supple Neck,No JVD, No cervical lymphadenopathy appriciated.  Symmetrical Chest wall movement, Good air movement bilaterally, CTAB, no use of accessory muscle. RRR,No Gallops,Rubs or new Murmurs, No Parasternal Heave +ve B.Sounds, Abd Soft, Non tender, No organomegaly appriciated, No rebound -guarding or rigidity. No Cyanosis, Clubbing or edema, No new Rash or bruise, no tremor.  Data Review  CBC w Diff: Lab Results  Component Value Date   WBC 7.4 06/25/2014   HGB 12.3 06/25/2014   HCT 37.5 06/25/2014   PLT 197 06/25/2014   LYMPHOPCT 29 10/29/2013   MONOPCT 8 10/29/2013   EOSPCT 1 10/29/2013   BASOPCT 0 10/29/2013   CMP: Lab Results  Component Value Date   NA 141 06/26/2014   K 4.1 06/26/2014   CL 104 06/26/2014   CO2 24 06/26/2014   BUN 18 06/26/2014   CREATININE 0.53 06/26/2014   PROT 7.9 06/26/2014   ALBUMIN 3.2* 06/26/2014   BILITOT 0.5 06/26/2014   ALKPHOS 70 06/26/2014   AST 95* 06/26/2014   ALT 74* 06/26/2014  .  Micro Results Recent Results (from the past 240 hour(s))  MRSA PCR SCREENING     Status: Abnormal   Collection Time    06/24/14 11:46 PM      Result Value Ref Range Status   MRSA by PCR INVALID RESULTS, SPECIMEN SENT FOR CULTURE (*) NEGATIVE Final   Comment: RESULT CALLED TO, READ BACK  BY AND VERIFIED WITH:     B.JONES,RN AT 0125 ON 06/25/14 BY SHEAW                The GeneXpert MRSA Assay (FDA     approved for NASAL specimens     only), is one component of a     comprehensive MRSA colonization     surveillance program. It is not     intended to diagnose MRSA     infection nor to guide or     monitor treatment for     MRSA infections.  MRSA CULTURE     Status: None   Collection Time    06/24/14 11:46 PM      Result Value Ref Range Status   Specimen Description NOSE   Final   Special Requests NONE   Final   Culture     Final   Value: NO STAPHYLOCOCCUS AUREUS ISOLATED     Note: NOMRSA     Performed at [facility]   Report Status 06/27/2014 FINAL   Final     Discharge Instructions     Please follow with your PCP in 5 days. Continue to take Ativan as needed for withdrawal symptoms, do not drive when he taking her medication, be aware of fall and with assistance. Follow-up Information   Follow up with Kaleen MaskELKINS,WILSON OLIVER, MD.   [facility]   Contact information:   [address] [place] [address] [phone]       Discharge Medications     Medication List         acetaminophen 500 MG tablet  Commonly known as:  TYLENOL  Take 1,000 mg by mouth every 6 (six) hours as needed.     amLODipine 5 MG tablet  Commonly known as:  NORVASC  Take 1 tablet (5 mg total) by mouth daily.     azithromycin 250 MG tablet  Commonly known as:  ZITHROMAX Z-PAK  First day he used to tablet, then use 1 tablet oral daily for the next 4 days.     folic acid 1 MG tablet  Commonly known as:  FOLVITE  Take 1 tablet (1 mg total) by mouth daily.     LORazepam 0.5 MG tablet  Commonly known as:  ATIVAN  Take 1 tablet (0.5 mg total) by mouth every 6 (six) hours as needed for anxiety.     methylPREDNIsolone  4 MG tablet  Commonly known as:  MEDROL DOSPACK  follow package directions     mometasone-formoterol 100-5 MCG/ACT Aero   Commonly known as:  DULERA  Inhale 2 puffs into the lungs 2 (two) times daily as needed for wheezing or shortness of breath.     multivitamin with minerals Tabs tablet  Take 1 tablet by mouth daily.     nicotine 21 mg/24hr patch  Commonly known as:  NICODERM CQ - dosed in mg/24 hours  Place 1 patch (21 mg total) onto the skin daily.     thiamine 100 MG tablet  Take 1 tablet (100 mg total) by mouth daily.     VIIBRYD 40 MG Tabs  Generic drug:  Vilazodone HCl  Take 40 mg by mouth daily.     VISINE OP  Place 1 drop into both eyes every morning.         Total Time in preparing paper work, data evaluation and todays exam - 35 minutes  Paarth Cropper M.D on 06/27/2014 at 11:24 AM  [facility] Office  [phone]

## 2015-02-05 ENCOUNTER — Other Ambulatory Visit: Payer: Self-pay | Admitting: Family Medicine

## 2015-02-05 DIAGNOSIS — M79603 Pain in arm, unspecified: Secondary | ICD-10-CM

## 2015-02-05 DIAGNOSIS — M542 Cervicalgia: Secondary | ICD-10-CM

## 2015-02-18 ENCOUNTER — Other Ambulatory Visit: Payer: Self-pay

## 2015-03-04 ENCOUNTER — Ambulatory Visit
Admission: RE | Admit: 2015-03-04 | Discharge: 2015-03-04 | Disposition: A | Discharge status: Home / Self Care | Payer: Medicaid Other | Source: Ambulatory Visit | Attending: Family Medicine | Admitting: Family Medicine

## 2015-03-04 DIAGNOSIS — M79603 Pain in arm, unspecified: Secondary | ICD-10-CM

## 2015-03-04 DIAGNOSIS — M542 Cervicalgia: Secondary | ICD-10-CM

## 2015-06-09 ENCOUNTER — Other Ambulatory Visit: Payer: Self-pay

## 2015-06-09 DIAGNOSIS — Z1231 Encounter for screening mammogram for malignant neoplasm of breast: Secondary | ICD-10-CM

## 2015-06-18 ENCOUNTER — Ambulatory Visit: Payer: Self-pay

## 2015-06-23 ENCOUNTER — Ambulatory Visit: Payer: Self-pay

## 2016-07-28 ENCOUNTER — Ambulatory Visit
Admission: RE | Admit: 2016-07-28 | Discharge: 2016-07-28 | Disposition: A | Discharge status: Home / Self Care | Payer: Medicaid Other | Source: Ambulatory Visit | Attending: Family Medicine | Admitting: Family Medicine

## 2016-07-28 ENCOUNTER — Other Ambulatory Visit: Payer: Self-pay | Admitting: Family Medicine

## 2016-07-28 DIAGNOSIS — R062 Wheezing: Secondary | ICD-10-CM

## 2016-07-28 DIAGNOSIS — R0600 Dyspnea, unspecified: Secondary | ICD-10-CM

## 2016-07-28 DIAGNOSIS — F17200 Nicotine dependence, unspecified, uncomplicated: Secondary | ICD-10-CM

## 2016-09-27 HISTORY — PX: TOTAL SHOULDER REPLACEMENT: SUR1217

## 2017-12-09 ENCOUNTER — Other Ambulatory Visit: Payer: Self-pay | Admitting: Family Medicine

## 2017-12-09 DIAGNOSIS — R1310 Dysphagia, unspecified: Secondary | ICD-10-CM

## 2017-12-13 ENCOUNTER — Ambulatory Visit
Admission: RE | Admit: 2017-12-13 | Discharge: 2017-12-13 | Disposition: A | Discharge status: Home / Self Care | Payer: No Typology Code available for payment source | Source: Ambulatory Visit | Attending: Family Medicine | Admitting: Family Medicine

## 2017-12-13 ENCOUNTER — Other Ambulatory Visit: Payer: Self-pay | Admitting: Family Medicine

## 2017-12-13 DIAGNOSIS — R059 Cough, unspecified: Secondary | ICD-10-CM

## 2017-12-13 DIAGNOSIS — R05 Cough: Secondary | ICD-10-CM

## 2017-12-13 DIAGNOSIS — R1310 Dysphagia, unspecified: Secondary | ICD-10-CM

## 2018-10-27 ENCOUNTER — Other Ambulatory Visit: Payer: Self-pay | Admitting: Family Medicine

## 2018-10-27 DIAGNOSIS — E2839 Other primary ovarian failure: Secondary | ICD-10-CM

## 2018-12-20 ENCOUNTER — Other Ambulatory Visit: Payer: Self-pay

## 2018-12-22 ENCOUNTER — Ambulatory Visit
Admission: RE | Admit: 2018-12-22 | Discharge: 2018-12-22 | Disposition: A | Discharge status: Home / Self Care | Payer: No Typology Code available for payment source | Source: Ambulatory Visit | Attending: Family Medicine | Admitting: Family Medicine

## 2018-12-22 ENCOUNTER — Other Ambulatory Visit: Payer: Self-pay | Admitting: Family Medicine

## 2018-12-22 DIAGNOSIS — J441 Chronic obstructive pulmonary disease with (acute) exacerbation: Secondary | ICD-10-CM

## 2019-02-20 ENCOUNTER — Other Ambulatory Visit: Payer: Self-pay

## 2019-03-06 ENCOUNTER — Other Ambulatory Visit: Payer: Self-pay

## 2019-03-06 ENCOUNTER — Other Ambulatory Visit: Payer: Self-pay | Admitting: Otolaryngology

## 2019-03-06 ENCOUNTER — Emergency Department (HOSPITAL_COMMUNITY)
Admission: EM | Admit: 2019-03-06 | Discharge: 2019-03-06 | Disposition: A | Discharge status: Home / Self Care | Payer: Medicare Other | Attending: Emergency Medicine | Admitting: Emergency Medicine

## 2019-03-06 ENCOUNTER — Emergency Department (HOSPITAL_COMMUNITY): Payer: Medicare Other

## 2019-03-06 ENCOUNTER — Encounter (HOSPITAL_COMMUNITY): Payer: Self-pay | Admitting: Emergency Medicine

## 2019-03-06 DIAGNOSIS — F17210 Nicotine dependence, cigarettes, uncomplicated: Secondary | ICD-10-CM | POA: Insufficient documentation

## 2019-03-06 DIAGNOSIS — E7220 Disorder of urea cycle metabolism, unspecified: Secondary | ICD-10-CM | POA: Insufficient documentation

## 2019-03-06 DIAGNOSIS — R519 Headache, unspecified: Secondary | ICD-10-CM

## 2019-03-06 DIAGNOSIS — R0602 Shortness of breath: Secondary | ICD-10-CM | POA: Diagnosis present

## 2019-03-06 DIAGNOSIS — I5030 Unspecified diastolic (congestive) heart failure: Secondary | ICD-10-CM | POA: Insufficient documentation

## 2019-03-06 DIAGNOSIS — I110 Hypertensive heart disease with heart failure: Secondary | ICD-10-CM | POA: Diagnosis not present

## 2019-03-06 DIAGNOSIS — Z79899 Other long term (current) drug therapy: Secondary | ICD-10-CM | POA: Diagnosis not present

## 2019-03-06 DIAGNOSIS — Z20828 Contact with and (suspected) exposure to other viral communicable diseases: Secondary | ICD-10-CM | POA: Insufficient documentation

## 2019-03-06 DIAGNOSIS — R51 Headache: Secondary | ICD-10-CM | POA: Insufficient documentation

## 2019-03-06 DIAGNOSIS — J449 Chronic obstructive pulmonary disease, unspecified: Secondary | ICD-10-CM

## 2019-03-06 DIAGNOSIS — R079 Chest pain, unspecified: Secondary | ICD-10-CM | POA: Diagnosis not present

## 2019-03-06 DIAGNOSIS — K111 Hypertrophy of salivary gland: Secondary | ICD-10-CM

## 2019-03-06 LAB — AMMONIA: Ammonia: 50 umol/L — ABNORMAL HIGH (ref 9–35)

## 2019-03-06 LAB — COMPREHENSIVE METABOLIC PANEL
ALT: 55 U/L — ABNORMAL HIGH (ref 0–44)
AST: 89 U/L — ABNORMAL HIGH (ref 15–41)
Albumin: 3.3 g/dL — ABNORMAL LOW (ref 3.5–5.0)
Alkaline Phosphatase: 78 U/L (ref 38–126)
Anion gap: 17 — ABNORMAL HIGH (ref 5–15)
BUN: 7 mg/dL — ABNORMAL LOW (ref 8–23)
CO2: 17 mmol/L — ABNORMAL LOW (ref 22–32)
Calcium: 9.7 mg/dL (ref 8.9–10.3)
Chloride: 102 mmol/L (ref 98–111)
Creatinine, Ser: 0.82 mg/dL (ref 0.44–1.00)
GFR calc Af Amer: >60 mL/min (ref >60)
GFR calc non Af Amer: >60 mL/min (ref >60)
Glucose, Bld: 105 mg/dL — ABNORMAL HIGH (ref 70–99)
Potassium: 3.9 mmol/L (ref 3.5–5.1)
Sodium: 136 mmol/L (ref 135–145)
Total Bilirubin: 0.9 mg/dL (ref 0.3–1.2)
Total Protein: 8.8 g/dL — ABNORMAL HIGH (ref 6.5–8.1)

## 2019-03-06 LAB — URINALYSIS, ROUTINE W REFLEX MICROSCOPIC
Bilirubin Urine: NEGATIVE
Glucose, UA: NEGATIVE mg/dL
Hgb urine dipstick: NEGATIVE
Ketones, ur: 5 mg/dL — AB
Leukocytes,Ua: NEGATIVE
Nitrite: NEGATIVE
Protein, ur: NEGATIVE mg/dL
Specific Gravity, Urine: 1.038 — ABNORMAL HIGH (ref 1.005–1.030)
pH: 6 (ref 5.0–8.0)

## 2019-03-06 LAB — CBC
HCT: 45.3 % (ref 36.0–46.0)
Hemoglobin: 14.4 g/dL (ref 12.0–15.0)
MCH: 31.9 pg (ref 26.0–34.0)
MCHC: 31.8 g/dL (ref 30.0–36.0)
MCV: 100.4 fL — ABNORMAL HIGH (ref 80.0–100.0)
Platelets: 244 10*3/uL (ref 150–400)
RBC: 4.51 MIL/uL (ref 3.87–5.11)
RDW: 14.1 % (ref 11.5–15.5)
WBC: 14.4 10*3/uL — ABNORMAL HIGH (ref 4.0–10.5)
nRBC: 0 % (ref 0.0–0.2)

## 2019-03-06 LAB — BASIC METABOLIC PANEL
Anion gap: 17 — ABNORMAL HIGH (ref 5–15)
BUN: 7 mg/dL — ABNORMAL LOW (ref 8–23)
CO2: 18 mmol/L — ABNORMAL LOW (ref 22–32)
Calcium: 9.8 mg/dL (ref 8.9–10.3)
Chloride: 101 mmol/L (ref 98–111)
Creatinine, Ser: 0.83 mg/dL (ref 0.44–1.00)
GFR calc Af Amer: >60 mL/min (ref >60)
GFR calc non Af Amer: >60 mL/min (ref >60)
Glucose, Bld: 103 mg/dL — ABNORMAL HIGH (ref 70–99)
Potassium: 3.9 mmol/L (ref 3.5–5.1)
Sodium: 136 mmol/L (ref 135–145)

## 2019-03-06 LAB — TROPONIN I: Troponin I: <0.03 ng/mL (ref <0.03)

## 2019-03-06 LAB — SARS CORONAVIRUS 2: SARS Coronavirus 2: NOT DETECTED

## 2019-03-06 LAB — PROTIME-INR
INR: 1.1 (ref 0.8–1.2)
Prothrombin Time: 14.1 seconds (ref 11.4–15.2)

## 2019-03-06 LAB — ETHANOL: Alcohol, Ethyl (B): 22 mg/dL — ABNORMAL HIGH (ref <10)

## 2019-03-06 MED ORDER — MORPHINE SULFATE (PF) 4 MG/ML IV SOLN
4.0000 mg | Freq: Once | INTRAVENOUS | Status: AC
  Administered 2019-03-06: 4 mg via INTRAVENOUS
  Filled 2019-03-06: qty 1

## 2019-03-06 MED ORDER — PREDNISONE 20 MG PO TABS
40.0000 mg | ORAL_TABLET | Freq: Once | ORAL | Status: AC
  Administered 2019-03-06: 40 mg via ORAL
  Filled 2019-03-06: qty 2

## 2019-03-06 MED ORDER — LACTULOSE 10 GM/15ML PO SOLN: 10.0000 g | Freq: Three times a day (TID) | ORAL | 0 refills | Status: AC

## 2019-03-06 MED ORDER — PREDNISONE 10 MG (21) PO TBPK: ORAL_TABLET | Freq: Every day | ORAL | 0 refills | Status: DC

## 2019-03-06 MED ORDER — SODIUM CHLORIDE 0.9% FLUSH
3.0000 mL | Freq: Once | INTRAVENOUS | Status: AC
  Administered 2019-03-06: 3 mL via INTRAVENOUS

## 2019-03-06 MED ORDER — IOHEXOL 300 MG/ML  SOLN
75.0000 mL | Freq: Once | INTRAMUSCULAR | Status: AC | PRN
  Administered 2019-03-06: 75 mL via INTRAVENOUS

## 2019-03-06 MED ORDER — ALBUTEROL SULFATE HFA 108 (90 BASE) MCG/ACT IN AERS
2.0000 | INHALATION_SPRAY | RESPIRATORY_TRACT | Status: DC | PRN
  Administered 2019-03-06: 2 via RESPIRATORY_TRACT
  Filled 2019-03-06: qty 6.7

## 2019-03-06 NOTE — ED Notes (Signed)
Patient transported to x-ray. ?

## 2019-03-06 NOTE — ED Notes (Signed)
Patient transported to CT 

## 2019-03-06 NOTE — ED Triage Notes (Signed)
Pt complains of CP that radiates into left arm and neck. Pt complains of headache and worsening SOB. CP has been ongoing since Friday. Pt states she vomited last night.

## 2019-03-06 NOTE — Discharge Instructions (Addendum)
Your head CT does not show any acute abnormality, included in this was a CT of the parotids that did not show any acute abnormality and the neck. Your ammonia level is elevated at 50.  You are being started on lactulose.  Your ammonia level should be rechecked by your doctor within the next several days You had a COVID test performed here due to concerns you are going to be admitted.  It is negative.  Please call your doctor tomorrow regarding your carotid artery abnormalities, as this may be a good time to be evaluated by the vascular surgeon since you have had a very recent negative COVID test. As discussed, you had multiple tests and blood work done here today.  Your chronic illnesses appear to be stable.  However, if you worsen at any time please return to the emergency department

## 2019-03-06 NOTE — ED Provider Notes (Signed)
[facility]   CSN: [id number] Arrival date & time: 03/06/19  1506    History   Chief Complaint Chief Complaint  Patient presents with  . Shortness of Breath  . Chest Pain    HPI [name] [name] is a 68 y.o. female.     HPI   68 year old female history of anemia, COPD, alcohol abuse presents today complaining of headache that began several days ago.  It was gradual in onset.  She is complaining of some pain behind her left ear that radiates across her head.  She denies being on blood thinners or having history of headaches or head injury.  She states that she was seen by Dr. 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  She states that she supposed to have a CT scan for this.  Nursing notes states that she is short of breath, however patient states she is at her baseline dyspnea level.  She denies any lateralized weakness, numbness, tingling, vision changes or speech problem.  States that she is always somewhat short of breath due to her COPD and continued smoking.  She also states that she continues to drink and has a history of cirrhosis.  She lives alone her husband passed away in December. Past Medical History:  Diagnosis Date  . Anemia    hx of-yrs ago  . Anxiety   . Arthritis   . Asthma    uses Albuterol inhaler as needed  . Cirrhosis (HCC)   . COPD (chronic obstructive pulmonary disease) (HCC)   . Depression    takes Viibryd daily  . Diastolic congestive heart failure (HCC)   . ETOH abuse   . Family history of anesthesia complication    sister gets sick with anesthesia  . Headache(784.0)    occasionally  . HTN (hypertension)    takes Lisinopril daily  . Joint pain   . Joint swelling   . Nocturia   . Pneumonia    about 2 yrs ago  . Respiratory failure with hypoxia (HCC)   . Systemic inflammatory response syndrome (HCC)   . Urinary frequency   . Weakness    numbness and tingling right  hand    Patient Active Problem List   Diagnosis Date Noted  . Alcohol withdrawal (HCC) 06/24/2014  . Alcohol withdrawal delirium (HCC) 06/24/2014  . COPD exacerbation (HCC) 06/24/2014  . 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 Orthostasis 12/06/2012  . Lethargy 12/01/2012  . Salicylate overdose 12/01/2012  . Alcohol dependence (HCC) 02/22/2012  . Major depression 02/22/2012  . COPD (chronic obstructive pulmonary disease) (HCC) 03/01/2011  . ARDS (adult respiratory distress syndrome) (HCC) 03/01/2011  . TOBACCO USE 11/12/2009  . ANEMIA-NOS 08/21/2008  . HYPERTENSION 08/21/2008  . HYPERTENSION, MALIGNANT ESSENTIAL 08/19/2008  . NUMBNESS 08/19/2008    Past Surgical History:  Procedure Laterality Date  . cataract surgery Bilateral   . CESAREAN SECTION    . CHOLECYSTECTOMY    . CYSTECTOMY     both ears  . DILATION AND CURETTAGE OF UTERUS    . REVERSE SHOULDER ARTHROPLASTY Right 11/08/2013   Procedure: RIGHT REVERSE SHOULDER ARTHROPLASTY;  Surgeon: Senaida LangeKevin M Supple, MD;  Location: [facility] OR;  Service: Orthopedics;  Laterality: Right;  . TOTAL ABDOMINAL HYSTERECTOMY    . TUBAL LIGATION       OB History   No obstetric history on file.      Home Medications    Prior to  Admission medications   Medication Sig Start Date End Date Taking? Authorizing Provider  albuterol (PROAIR HFA) 108 (90 Base) MCG/ACT inhaler Inhale 2 puffs into the lungs every 4 (four) hours as needed for wheezing or shortness of breath.   Yes [provider]  Aspirin-Salicylamide-Caffeine (BC HEADACHE POWDER PO) Take 1 packet by mouth as needed (for headaches).   Yes [provider]  Dextran 70-Hypromellose, PF, (ARTIFICIAL TEARS PF) 0.1-0.3 % SOLN Place 1-2 drops into both eyes 3 (three) times daily as needed (for dryness).   Yes [provider]  folic acid (FOLVITE) 800 MCG tablet Take 800 mcg by mouth daily with breakfast.   Yes [provider]  GLUCOSAMINE-CHONDROITIN PO Take  2 tablets by mouth daily with breakfast.   Yes [provider]  ipratropium-albuterol (DUONEB) 0.5-2.5 (3) MG/3ML SOLN Take 3 mLs by nebulization every 6 (six) hours.   Yes [provider]  Multiple Vitamins-Minerals (ONE-A-DAY WOMENS 50+ ADVANTAGE) TABS Take 1 tablet by mouth daily with breakfast.   Yes [provider]  amLODipine (NORVASC) 5 MG tablet Take 1 tablet (5 mg total) by mouth daily. Patient not taking: Reported on 03/06/2019 06/27/14   Elgergawy, Leana Roeawood S, MD  azithromycin (ZITHROMAX Z-PAK) 250 MG tablet First day he used to tablet, then use 1 tablet oral daily for the next 4 days. Patient not taking: Reported on 03/06/2019 06/27/14   Elgergawy, Leana Roeawood S, MD  folic acid (FOLVITE) 1 MG tablet Take 1 tablet (1 mg total) by mouth daily. Patient not taking: Reported on 03/06/2019 06/27/14   Elgergawy, Leana Roeawood S, MD  LORazepam (ATIVAN) 0.5 MG tablet Take 1 tablet (0.5 mg total) by mouth every 6 (six) hours as needed for anxiety. Patient not taking: Reported on 03/06/2019 06/27/14   Elgergawy, Leana Roeawood S, MD  methylPREDNIsolone (MEDROL DOSPACK) 4 MG tablet follow package directions Patient not taking: Reported on 03/06/2019 06/27/14   Elgergawy, Leana Roeawood S, MD  mometasone-formoterol (DULERA) 100-5 MCG/ACT AERO Inhale 2 puffs into the lungs 2 (two) times daily as needed for wheezing or shortness of breath.    [provider]  Multiple Vitamin (MULTIVITAMIN WITH MINERALS) TABS tablet Take 1 tablet by mouth daily. Patient not taking: Reported on 03/06/2019 06/27/14   Elgergawy, Leana Roeawood S, MD  nicotine (NICODERM CQ - DOSED IN MG/24 HOURS) 21 mg/24hr patch Place 1 patch (21 mg total) onto the skin daily. Patient not taking: Reported on 03/06/2019 06/27/14   Elgergawy, Leana Roeawood S, MD  thiamine 100 MG tablet Take 1 tablet (100 mg total) by mouth daily. Patient not taking: Reported on 03/06/2019 06/27/14   Elgergawy, Leana Roeawood S, MD  Vilazodone HCl (VIIBRYD) 40 MG TABS Take 40 mg by mouth daily.     [provider]    Family History Family History  Problem Relation Age of Onset  . Heart disease Father   . Lung cancer Father   . Diabetes Father   . Asthma Father   . Alcoholism Father   . Lung cancer Mother     Social History Social History   Tobacco Use  . Smoking status: Current Every Day Smoker    Packs/day: 1.00    Years: 43.00    Pack years: 43.00    Types: Cigarettes  . Smokeless tobacco: Never Used  . Tobacco comment: relapsed smoking june 2012  Substance Use Topics  . Alcohol use: Yes    Comment: 12  to 18 pack per day  . Drug use: No  Allergies   Penicillins; Adhesive [tape]; and Codeine   Review of Systems Review of Systems  All other systems reviewed and are negative.    Physical Exam Updated Vital Signs BP 129/89   Pulse 97   Temp 98.5 F (36.9 C) (Oral)   Resp (!) 24   Ht 1.549 m (5\' 1" )   Wt 95.3 kg   SpO2 92%   BMI 39.68 kg/m   Physical Exam Vitals signs and nursing note reviewed.  Constitutional:      Appearance: She is obese.  HENT:     Head: Normocephalic.     Mouth/Throat:     Mouth: Mucous membranes are moist.  Eyes:     Pupils: Pupils are equal, round, and reactive to light.  Cardiovascular:     Rate and Rhythm: Normal rate and regular rhythm.  Pulmonary:     Effort: Pulmonary effort is normal.     Breath sounds: Wheezing present.  Chest:     Chest wall: No mass.  Abdominal:     General: Bowel sounds are normal.     Palpations: Abdomen is soft.  Musculoskeletal: Normal range of motion.  Skin:    General: Skin is warm and dry.     Capillary Refill: Capillary refill takes less than 2 seconds.  Neurological:     General: No focal deficit present.     Mental Status: She is alert.  Psychiatric:        Mood and Affect: Mood normal.      ED Treatments / Results  Labs (all labs ordered are listed, but only abnormal results are displayed) Labs Reviewed  BASIC METABOLIC PANEL - Abnormal; Notable  for the following components:      Result Value   CO2 18 (*)    Glucose, Bld 103 (*)    BUN 7 (*)    Anion gap 17 (*)    All other components within normal limits  CBC - Abnormal; Notable for the following components:   WBC 14.4 (*)    MCV 100.4 (*)    All other components within normal limits  COMPREHENSIVE METABOLIC PANEL - Abnormal; Notable for the following components:   CO2 17 (*)    Glucose, Bld 105 (*)    BUN 7 (*)    Total Protein 8.8 (*)    Albumin 3.3 (*)    AST 89 (*)    ALT 55 (*)    Anion gap 17 (*)    All other components within normal limits  AMMONIA - Abnormal; Notable for the following components:   Ammonia 50 (*)    All other components within normal limits  ETHANOL - Abnormal; Notable for the following components:   Alcohol, Ethyl (B) 22 (*)    All other components within normal limits  SARS CORONAVIRUS 2  TROPONIN I  PROTIME-INR  URINALYSIS, ROUTINE W REFLEX MICROSCOPIC    EKG EKG Interpretation  Date/Time:  Tuesday March 06 2019 15:10:49 EDT Ventricular Rate:  105 PR Interval:  146 QRS Duration: 80 QT Interval:  328 QTC Calculation: 433 R Axis:   39 Text Interpretation:  Sinus tachycardia Otherwise normal ECG Confirmed by Pattricia Boss [phone]) on 03/06/2019 8:08:02 PM   Radiology Dg Chest 2 View  Result Date: 03/06/2019 CLINICAL DATA:  Chest pain EXAM: CHEST - 2 VIEW COMPARISON:  12/22/2018 FINDINGS: Right shoulder replacement. Mild bronchitic changes at the bases, chronic. No focal airspace disease or effusion. Stable cardiomediastinal silhouette with aortic atherosclerosis. No pneumothorax. Emphysematous  disease. IMPRESSION: Emphysematous disease and bronchitic changes. No acute airspace disease. Electronically Signed   By: Jasmine PangKim  Fujinaga M.D.   On: 03/06/2019 16:12    Procedures Procedures (including critical care time)  Medications Ordered in ED Medications  albuterol (VENTOLIN HFA) 108 (90 Base) MCG/ACT inhaler 2 puff (2 puffs Inhalation  Given 03/06/19 1628)  sodium chloride flush (NS) 0.9 % injection 3 mL (3 mLs Intravenous Given 03/06/19 1542)  predniSONE (DELTASONE) tablet 40 mg (40 mg Oral Given 03/06/19 1629)     Initial Impression / Assessment and Plan / ED Course  I have reviewed the triage vital signs and the nursing notes.  Pertinent labs & imaging results that were available during my care of the patient were reviewed by me and considered in my medical decision making (see chart for details).      68 year old female with headache, neck pain, arm pain, chronic dyspnea, and history of chest pain. 1 evaluated for headaches here with CT head, neck, and cervical spine.  No evidence of bleeding.  Neck is supple.  Doubt meningitis.  She does have an elevated white blood cell count, but is afebrile.  Symptoms have been going on for 3 to 4 days.  She is treated here with morphine and the headache has mostly resolved but she does continue to complain of some pain in the back of her neck and radiating down her left arm.  This does not appear to have any acute neuro deficits.  There has been some concern by her daughter that this was representing her cardiac pain.  By history it sounds more consistent with radicular pain.  CT of neck does not show any severe knee abnormality in the cervical spine.  Neurologically she is intact. 2 dyspnea patient was noted to be dyspneic by nursing.  I have discussed this also with her daughter.  The patient states that she has COPD and is oxygen.  She states that this is no different from prior.  On her lung exam she had diffuse wheezing.  She is given albuterol and prednisone here.  She had a cover test done here in anticipation that she had some coughing and may be admitted, however this is negative.  The patient appears to be at baseline for COPD.  Will start prednisone taper. 3 chest pain this was noted as part of the pain that was coming from her neck to her head and may have had some chest component  although currently she denies any chest pain.  Troponin is normal.  Given her history, I have a low index of suspicion that this is cardiac in etiology 4 for elevated ammonia patient has a known history of cirrhosis and drinks alcohol and continues to drink alcohol.  This could contribute to her headache.  She will be started on some lactulose.  She is awake and alert here.  Again all this was discussed with her daughter.  She will need to have her ammonia level rechecked in the primary care office.   Final Clinical Impressions(s) / ED Diagnoses   Final diagnoses:  Nonintractable headache, unspecified chronicity pattern, unspecified headache type  Chronic obstructive pulmonary disease, unspecified COPD type (HCC)  Hyperammonemia [facility](HCC)    ED Discharge Orders    None       Margarita Grizzleay, Graves Nipp, MD 03/06/19 2014

## 2019-03-06 NOTE — ED Notes (Signed)
ED Provider at bedside. 

## 2019-03-06 NOTE — ED Notes (Signed)
Pt desat to 89% on room air, pt placed on 2L [place], oxygen now at 99%

## 2019-03-06 NOTE — ED Notes (Signed)
Pt given ginger ale.

## 2019-03-19 ENCOUNTER — Ambulatory Visit (INDEPENDENT_AMBULATORY_CARE_PROVIDER_SITE_OTHER): Payer: Medicare Other | Admitting: Emergency Medicine

## 2019-03-19 ENCOUNTER — Other Ambulatory Visit: Payer: Self-pay

## 2019-03-19 ENCOUNTER — Encounter: Payer: Self-pay | Admitting: Emergency Medicine

## 2019-03-19 DIAGNOSIS — J449 Chronic obstructive pulmonary disease, unspecified: Secondary | ICD-10-CM | POA: Diagnosis not present

## 2019-03-19 MED ORDER — STIOLTO RESPIMAT 2.5-2.5 MCG/ACT IN AERS: 2.0000 | INHALATION_SPRAY | Freq: Every day | RESPIRATORY_TRACT | 5 refills | Status: DC

## 2019-03-19 NOTE — Addendum Note (Signed)
Addended by: Hildred Alamin I on: 03/19/2019 04:31 PM   Modules accepted: Orders

## 2019-03-19 NOTE — Patient Instructions (Addendum)
We will repeat your pulmonary function testing  Please stop Ruthe Mannan We will try restarting Stiolto 2 puffs once daily to see if you get more benefit. Keep your DuoNeb available to use 1 treatment up to every 6 hours if you needed for shortness of breath.  You do not have to take this on a schedule. Walking oximetry today on room air.  This is to see if you qualify for supplemental oxygen. Depending on your work-up and your response to treatment we will decide whether we need to refer you for a cardiology evaluation to further investigate your shortness of breath. We made an agreement today that you would work to decrease her cigarettes to 1.5  packs daily by our next visit. Follow with Dr Lamonte Sakai in 1 month

## 2019-03-19 NOTE — Progress Notes (Signed)
Subjective:    Patient ID: [name] [name], female    DOB: 09-07-1951, 68 y.o.   MRN: [id number]  HPI 68 year old woman with a history of heavy tobacco use (2 packs a day, 86 pack years), alcohol use with cirrhosis, hypertension with diastolic CHF.  Carries a history of COPD, pulmonary function testing from 03/30/2011 reviewed and show mild obstruction without a bronchodilator response, normal volumes, decreased diffusion capacity that corrects to the normal range when adjusted for alveolar volume.  She had a chest x-ray on 03/06/2019 that I reviewed that does not show any focal infiltrates, does show hyperinflation and some mild bronchitic change bilaterally.  She is referred today for progression of exertional SOB since January. Her husband died around that time. She has cough prod of clear. She hears wheeze often. She is somewhat limited with her activity, has to stop to rest when doing housework. She can shop with a handicap cart.   She is using DuoNeb tid. Also has Dulera.  She feels that the nebs benefit her. She has been on stiolto before, felt benefit but was unable to afford.   She is smoking 2 pks a day, she is contemplating cutting down. Willing to try to get to 1.5 pk/day./    Review of Systems  Respiratory: Positive for cough and shortness of breath.   Neurological: Positive for headaches.  Psychiatric/Behavioral: Positive for dysphoric mood. The patient is nervous/anxious.    Past Medical History:  Diagnosis Date  . Anemia    hx of-yrs ago  . Anxiety   . Arthritis   . Asthma    uses Albuterol inhaler as needed  . Cirrhosis (HCC)   . COPD (chronic obstructive pulmonary disease) (HCC)   . Depression    takes Viibryd daily  . Diastolic congestive heart failure (HCC)   . ETOH abuse   . Family history of anesthesia complication    sister gets sick with anesthesia  . Headache(784.0)    occasionally  . HTN (hypertension)    takes Lisinopril daily  . Joint pain   . Joint  swelling   . Nocturia   . Pneumonia    about 2 yrs ago  . Respiratory failure with hypoxia (HCC)   . Systemic inflammatory response syndrome (HCC)   . Urinary frequency   . Weakness    numbness and tingling right hand     Family History  Problem Relation Age of Onset  . Heart disease Father   . Lung cancer Father   . Diabetes Father   . Asthma Father   . Alcoholism Father   . Lung cancer Mother      Social History   Socioeconomic History  . Marital status: Divorced    Spouse name: Not on file  . Number of children: Not on file  . Years of education: Not on file  . Highest education level: Not on file  Occupational History  . Occupation: retired  Engineer, productionocial Needs  . Financial resource strain: Not on file  . Food insecurity    Worry: Not on file    Inability: Not on file  . Transportation needs    Medical: Not on file    Non-medical: Not on file  Tobacco Use  . Smoking status: Current Every Day Smoker    Packs/day: 2.00    Years: 43.00    Pack years: 86.00    Types: Cigarettes  . Smokeless tobacco: Never Used  . Tobacco comment: relapsed smoking june 2012  Substance and Sexual Activity  . Alcohol use: Yes    Comment: 12  to 18 pack per day  . Drug use: No  . Sexual activity: Not Currently    Birth control/protection: Surgical  Lifestyle  . Physical activity    Days per week: Not on file    Minutes per session: Not on file  . Stress: Not on file  Relationships  . Social Musicianconnections    Talks on phone: Not on file    Gets together: Not on file    Attends religious service: Not on file    Active member of club or organization: Not on file    Attends meetings of clubs or organizations: Not on file    Relationship status: Not on file  . Intimate partner violence    Fear of current or ex partner: Not on file    Emotionally abused: Not on file    Physically abused: Not on file    Forced sexual activity: Not on file  Other Topics Concern  . Not on file   Social History Narrative   Pt was adopted at age 68 but still is in contact with biological family.      Allergies  Allergen Reactions  . Penicillins   . Adhesive [Tape] Itching and Rash    Causes skin to itch and break out  . Codeine Itching and Rash     Outpatient Medications Prior to Visit  Medication Sig Dispense Refill  . albuterol (PROAIR HFA) 108 (90 Base) MCG/ACT inhaler Inhale 2 puffs into the lungs every 4 (four) hours as needed for wheezing or shortness of breath.    . Dextran 70-Hypromellose, PF, (ARTIFICIAL TEARS PF) 0.1-0.3 % SOLN Place 1-2 drops into both eyes 3 (three) times daily as needed (for dryness).    . folic acid (FOLVITE) 1 MG tablet Take 1 tablet (1 mg total) by mouth daily. 30 tablet 0  . GLUCOSAMINE-CHONDROITIN PO Take 2 tablets by mouth daily with breakfast.    . ipratropium-albuterol (DUONEB) 0.5-2.5 (3) MG/3ML SOLN Take 3 mLs by nebulization every 6 (six) hours.    Marland Kitchen. lactulose (CHRONULAC) 10 GM/15ML solution Take 15 mLs (10 g total) by mouth 3 (three) times daily. 236 mL 0  . Multiple Vitamins-Minerals (ONE-A-DAY WOMENS 50+ ADVANTAGE) TABS Take 1 tablet by mouth daily with breakfast.    . amLODipine (NORVASC) 5 MG tablet Take 1 tablet (5 mg total) by mouth daily. (Patient not taking: Reported on 03/06/2019) 30 tablet 0  . Aspirin-Salicylamide-Caffeine (BC HEADACHE POWDER PO) Take 1 packet by mouth as needed (for headaches).    Marland Kitchen. azithromycin (ZITHROMAX Z-PAK) 250 MG tablet First day he used to tablet, then use 1 tablet oral daily for the next 4 days. (Patient not taking: Reported on 03/06/2019) 6 each 0  . LORazepam (ATIVAN) 0.5 MG tablet Take 1 tablet (0.5 mg total) by mouth every 6 (six) hours as needed for anxiety. (Patient not taking: Reported on 03/06/2019) 30 tablet 0  . mometasone-formoterol (DULERA) 100-5 MCG/ACT AERO Inhale 2 puffs into the lungs 2 (two) times daily as needed for wheezing or shortness of breath.    . nicotine (NICODERM CQ - DOSED IN MG/24  HOURS) 21 mg/24hr patch Place 1 patch (21 mg total) onto the skin daily. (Patient not taking: Reported on 03/06/2019) 28 patch 0  . thiamine 100 MG tablet Take 1 tablet (100 mg total) by mouth daily. (Patient not taking: Reported on 03/06/2019) 30 tablet 0  . Vilazodone  HCl (VIIBRYD) 40 MG TABS Take 40 mg by mouth daily.    . folic acid (FOLVITE) 387 MCG tablet Take 800 mcg by mouth daily with breakfast.    . methylPREDNIsolone (MEDROL DOSPACK) 4 MG tablet follow package directions (Patient not taking: Reported on 03/06/2019) 21 tablet 0  . Multiple Vitamin (MULTIVITAMIN WITH MINERALS) TABS tablet Take 1 tablet by mouth daily. (Patient not taking: Reported on 03/19/2019) 30 tablet 0  . predniSONE (STERAPRED UNI-PAK 21 TAB) 10 MG (21) TBPK tablet Take by mouth daily. Take 6 tabs by mouth daily  for 2 days, then 5 tabs for 2 days, then 4 tabs for 2 days, then 3 tabs for 2 days, 2 tabs for 2 days, then 1 tab by mouth daily for 2 days 42 tablet 0   No facility-administered medications prior to visit.         Objective:   Physical Exam Vitals:   03/19/19 1543  BP: 116/70  Pulse: (!) 115  Temp: (!) 97.5 F (36.4 C)  TempSrc: Oral  SpO2: 94%  Weight: 209 lb 12.8 oz (95.2 kg)  Height: 5\' 2"  (1.575 m)   Gen: Pleasant, obese woman, in no distress,  normal affect  ENT: No lesions,  mouth clear,  oropharynx clear, no postnasal drip  Neck: No JVD, no stridor  Lungs: No use of accessory muscles, distant, no wheeze on forced expiration  Cardiovascular: RRR, heart sounds normal, no murmur or gallops, trace peripheral edema  Musculoskeletal: No deformities, no cyanosis or clubbing  Neuro: alert, awake, non focal  Skin: Warm, no lesions or rash    Assessment & Plan:  COPD (chronic obstructive pulmonary disease) We will repeat your pulmonary function testing  Please stop Dulera We will try restarting Stiolto 2 puffs once daily to see if you get more benefit. Keep your DuoNeb available to use 1  treatment up to every 6 hours if you needed for shortness of breath.  You do not have to take this on a schedule. Walking oximetry today on room air.  This is to see if you qualify for supplemental oxygen. Depending on your work-up and your response to treatment we will decide whether we need to refer you for a cardiology evaluation to further investigate your shortness of breath. We made an agreement today that you would work to decrease her cigarettes to 1.5  packs daily by our next visit. Follow with Dr Lamonte Sakai in 1 month  Baltazar Apo, MD, PhD 03/19/2019, 4:16 PM [facility] [phone] or if no answer [phone]

## 2019-03-19 NOTE — Assessment & Plan Note (Signed)
We will repeat your pulmonary function testing  Please stop Dulera We will try restarting Stiolto 2 puffs once daily to see if you get more benefit. Keep your DuoNeb available to use 1 treatment up to every 6 hours if you needed for shortness of breath.  You do not have to take this on a schedule. Walking oximetry today on room air.  This is to see if you qualify for supplemental oxygen. Depending on your work-up and your response to treatment we will decide whether we need to refer you for a cardiology evaluation to further investigate your shortness of breath. We made an agreement today that you would work to decrease her cigarettes to 1.5  packs daily by our next visit. Follow with Dr Jerico Grisso in 1 month  

## 2019-03-22 ENCOUNTER — Other Ambulatory Visit: Payer: Self-pay | Admitting: Gastroenterology

## 2019-03-22 DIAGNOSIS — K7469 Other cirrhosis of liver: Secondary | ICD-10-CM

## 2019-04-02 ENCOUNTER — Institutional Professional Consult (permissible substitution): Payer: Self-pay | Admitting: Pulmonary Disease

## 2019-04-04 ENCOUNTER — Other Ambulatory Visit: Payer: No Typology Code available for payment source

## 2019-04-12 ENCOUNTER — Ambulatory Visit
Admission: RE | Admit: 2019-04-12 | Discharge: 2019-04-12 | Disposition: A | Discharge status: Home / Self Care | Payer: No Typology Code available for payment source | Source: Ambulatory Visit | Attending: Gastroenterology | Admitting: Gastroenterology

## 2019-04-12 DIAGNOSIS — K7469 Other cirrhosis of liver: Secondary | ICD-10-CM

## 2019-04-17 NOTE — Progress Notes (Signed)
History of Present Illness [name] [name] is a 68 y.o. female  Current every day smoker  with a  history of heavy tobacco use (2 packs a day, 86 pack years), alcohol use with cirrhosis, hypertension with diastolic CHF.  Carries a history of COPD, pulmonary function testing from 03/30/2011 reviewed and show mild obstruction without a bronchodilator response, normal volumes, decreased diffusion capacity that corrects to the normal range when adjusted for alveolar volume.  CXR  does show hyperinflation and some mild bronchitic change bilaterally.   04/18/2019 One Month OV follow up Pt. Presents for follow up. She was seen by Dr. Delton [name] 03/19/2019 for progressive worsening of her dyspnea since January 2020 (of note her husband died at about this time).She endorsed  a productive cough with clear sputum, and wheezing. She is somewhat limited with her activity, has to stop to rest when doing housework. She shops with a handicap cart. She presented to Dr. Delton [name] with a pulmonary regiment of DuoNeb tid. Also has Dulera.  She feels that the nebs benefit her. She has been on stiolto before, felt benefit but was unable to afford. She was smoking 2 packs of cigarettes daily, and had contemplated cutting down, but had not started to actively try to cut her per day smoking.   Plan of care after that visit was to  repeat your pulmonary function testing, stop Dulera , try restarting Stiolto 2 puffs once daily to see if she had improved benefit,Continue  DuoNebs  1 treatment up to every 6 hours as  needed for shortness of breath or wheezing.She had a walking saturation test done, she did not qualify for supplemental oxygen. Dr. Delton [name]  explained that he would refer to cardiology based on the results of her work up for dyspnea. She agreed to cut her daily smoking from 2 PPD to 1.5 PPD by today's visit for a 1 month follow up.   Pt. Presents today. She states she has been using her Stialto and she feels this has been  beneficial.  She states she feels this works better than the [employer] TireDulera she had previously been using. She is smoking 1.5 packs of  cigarettes daily. Pulmonary Function Testing has not been done. She presents today and she appears to be struggling. She states she uses her DuoNebs about once daily. She has cut her smoking to 1.5 packs per day as she promised Dr. Delton [name] she would. She does cough, but secretions remain clear. She is very deconditioned. Her work of breathing is disproportionate to her oxygen saturations. She wheezes with exertion. She states she is unable to climb stairs. Her husband died in December . This has been a very stressful interval for her. She denies any fever, chest pain, orthopnea or hemoptysis.   Test Results:  CXR 04/18/2019 No Acute Abnormalities  Pulmonary Functions Testing Results: 03/30/2011 Mild obstructive lung deficit decrease in flow rate at 50% and 75% of the flow volume curve.  Lung volumes within normal limits.  Mild decrease in diffusion capacity.  Insignificant response to bronchodilator.   FVC 3.01 L or 112% FEV1 1.95 L or 98% FEF 25-75% 0.83 L/s or 34%  TLC 4.67 L or 114% DLCO 12.7 or 65%  03/2019 CXR >> pending  Echo 03/2019 >> pending    CBC Latest Ref Rng & Units 03/06/2019 06/25/2014 06/24/2014  WBC 4.0 - 10.5 K/uL 14.4(H) 7.4 9.0  Hemoglobin 12.0 - 15.0 g/dL 25.314.4 66.412.3 40.312.7  Hematocrit 36.0 - 46.0 % 45.3 37.5 38.6  Platelets 150 - 400 K/uL 244 197 209    BMP Latest Ref Rng & Units 03/06/2019 03/06/2019 06/26/2014  Glucose 70 - 99 mg/dL 161(W105(H) 960(A103(H) 540(J103(H)  BUN 8 - 23 mg/dL 7(L) 7(L) 18  Creatinine 0.44 - 1.00 mg/dL 8.110.82 9.140.83 7.820.53  Sodium 135 - 145 mmol/L 136 136 141  Potassium 3.5 - 5.1 mmol/L 3.9 3.9 4.1  Chloride 98 - 111 mmol/L 102 101 104  CO2 22 - 32 mmol/L 17(L) 18(L) 24  Calcium 8.9 - 10.3 mg/dL 9.7 9.8 9.3    BNP No results found for: BNP  ProBNP    Component Value Date/Time   PROBNP 20.6 06/24/2014 1145    PFT No results found  for: FEV1PRE, FEV1POST, FVCPRE, FVCPOST, TLC, DLCOUNC, PREFEV1FVCRT, PSTFEV1FVCRT  [place] Abdomen Limited Ruq  Result Date: 04/12/2019 CLINICAL DATA:  Cirrhosis EXAM: ULTRASOUND ABDOMEN LIMITED RIGHT UPPER QUADRANT COMPARISON:  12/02/2012 FINDINGS: Gallbladder: Prior cholecystectomy. Common bile duct: Diameter: 4.4 mm Liver: No focal hepatic mass. Coarse hepatic echotexture with a nodular contour or consistent with cirrhosis. Portal vein is patent on color Doppler imaging with normal direction of blood flow towards the liver. IMPRESSION: Cirrhotic liver.  No focal hepatic mass. Electronically Signed   By: [name] [name]   On: 04/12/2019 14:31     Past medical hx Past Medical History:  Diagnosis Date  . Anemia    hx of-yrs ago  . Anxiety   . Arthritis   . Asthma    uses Albuterol inhaler as needed  . Cirrhosis (HCC)   . COPD (chronic obstructive pulmonary disease) (HCC)   . Depression    takes Viibryd daily  . Diastolic congestive heart failure (HCC)   . ETOH abuse   . Family history of anesthesia complication    sister gets sick with anesthesia  . Headache(784.0)    occasionally  . HTN (hypertension)    takes Lisinopril daily  . Joint pain   . Joint swelling   . Nocturia   . Pneumonia    about 2 yrs ago  . Respiratory failure with hypoxia (HCC)   . Systemic inflammatory response syndrome (HCC)   . Urinary frequency   . Weakness    numbness and tingling right hand     Social History   Tobacco Use  . Smoking status: Current Every Day Smoker    Packs/day: 2.00    Years: 43.00    Pack years: 86.00    Types: Cigarettes  . Smokeless tobacco: Never Used  . Tobacco comment: relapsed smoking june 2012  Substance Use Topics  . Alcohol use: Yes    Comment: 12  to 18 pack per day  . Drug use: No    [name] [name] reports that she has been smoking cigarettes. She has a 86.00 pack-year smoking history. She has never used smokeless tobacco. She reports current alcohol use. She reports  that she does not use drugs.  Tobacco Cessation: Current Every day smoker, 2 packs per day, with an 86 pack year smoking history  I have spent 3 minutes counseling patient on smoking cessation this visit. Patient verbalizes understanding of their  choice to continue smoking and the negative health consequences including worsening of COPD, risk of lung cancer , stroke and heart disease.Marland Kitchen.    Past surgical hx, Family hx, Social hx all reviewed.  Current Outpatient Medications on File Prior to Visit  Medication Sig  . albuterol (PROAIR HFA) 108 (90 Base) MCG/ACT inhaler Inhale 2 puffs into the lungs every  4 (four) hours as needed for wheezing or shortness of breath.  Marland Kitchen. amLODipine (NORVASC) 5 MG tablet Take 1 tablet (5 mg total) by mouth daily.  . Aspirin-Salicylamide-Caffeine (BC HEADACHE POWDER PO) Take 1 packet by mouth as needed (for headaches).  . Dextran 70-Hypromellose, PF, (ARTIFICIAL TEARS PF) 0.1-0.3 % SOLN Place 1-2 drops into both eyes 3 (three) times daily as needed (for dryness).  . folic acid (FOLVITE) 1 MG tablet Take 1 tablet (1 mg total) by mouth daily.  Marland Kitchen. GLUCOSAMINE-CHONDROITIN PO Take 2 tablets by mouth daily with breakfast.  . ipratropium-albuterol (DUONEB) 0.5-2.5 (3) MG/3ML SOLN Take 3 mLs by nebulization every 6 (six) hours.  Marland Kitchen. lactulose (CHRONULAC) 10 GM/15ML solution Take 15 mLs (10 g total) by mouth 3 (three) times daily.  Marland Kitchen. LORazepam (ATIVAN) 0.5 MG tablet Take 1 tablet (0.5 mg total) by mouth every 6 (six) hours as needed for anxiety.  . mometasone-formoterol (DULERA) 100-5 MCG/ACT AERO Inhale 2 puffs into the lungs 2 (two) times daily as needed for wheezing or shortness of breath.  . Multiple Vitamins-Minerals (ONE-A-DAY WOMENS 50+ ADVANTAGE) TABS Take 1 tablet by mouth daily with breakfast.  . nicotine (NICODERM CQ - DOSED IN MG/24 HOURS) 21 mg/24hr patch Place 1 patch (21 mg total) onto the skin daily.  Marland Kitchen. thiamine 100 MG tablet Take 1 tablet (100 mg total) by mouth  daily.  . Tiotropium Bromide-Olodaterol (STIOLTO RESPIMAT) 2.5-2.5 MCG/ACT AERS Inhale 2 puffs into the lungs daily.  . Vilazodone HCl (VIIBRYD) 40 MG TABS Take 40 mg by mouth daily.   No current facility-administered medications on file prior to visit.      Allergies  Allergen Reactions  . Penicillins   . Adhesive [Tape] Itching and Rash    Causes skin to itch and break out  . Codeine Itching and Rash    Review Of Systems:  Constitutional:   No  weight loss, night sweats,  Fevers, chills, + fatigue, or  lassitude.  HEENT:   No headaches,  Difficulty swallowing,  Tooth/dental problems, or  Sore throat,                No sneezing, itching, ear ache, nasal congestion, post nasal drip,   CV:  No chest pain,  Orthopnea, PND, + swelling in lower extremities, No anasarca, dizziness, palpitations, syncope.   GI  No heartburn, indigestion, abdominal pain, nausea, vomiting, diarrhea, change in bowel habits, loss of appetite, bloody stools.   Resp: + shortness of breath with exertion or at rest.  No excess mucus, + productive cough,  No non-productive cough,  No coughing up of blood.  No change in color of mucus.  + wheezing.  No chest wall deformity  Skin: no rash or lesions.  GU: no dysuria, change in color of urine, no urgency or frequency.  No flank pain, no hematuria   MS:  No joint pain or swelling.  No decreased range of motion.  No back pain.  Psych:  No change in mood or affect. No depression or anxiety.  No memory loss.   Vital Signs BP 120/72 (BP Location: Left Arm, Cuff Size: Large)   Pulse (!) 120   Ht 5\' 2"  (1.575 m)   Wt 211 lb (95.7 kg)   SpO2 94%   BMI 38.59 kg/m    Physical Exam:  General- No distress,  A&Ox3, pleasant ENT: No sinus tenderness, TM clear, pale nasal mucosa, no oral exudate,no post nasal drip, no LAN Cardiac: S1, S2, regular rate  and rhythm, no murmur Chest: + wheeze/ No rales/ dullness; + accessory muscle use with exertion only, no nasal  flaring, no sternal retractions Abd.: Soft Non-tender, ND, BS +, Body mass index is 38.59 kg/m. Ext: No clubbing cyanosis,1+ BLE  edema Neuro:  Deconditioned at baseline, MAE x 4, A&O x 3 Skin: No rashes, No lesions skin is warm and dry Psych: normal mood and behavior, anxious   Assessment/Plan  COPD (chronic obstructive pulmonary disease) Dyspnea on exertion despite medical regimen compliance Plan We will do a CXR today We will call you with the results We will schedule a 2 D Echo to take a look at your heart.  Lets work on cutting  your cigarette smoking to one pack a day over the next month.   Continue the Stialto 2 puffs daily Rinse mouth after use. Continue Duonebs 1 treatment up to every 6 hours as  needed for shortness of breath or wheezing We will walk you today to see if we can get you qualified for oxygen.  You qualified for oxygen today.  Wear your oxygen at 2 L with exertion and at rest Saturation goals are 88-92% You cannot smoke while wearing your oxygen, or while near your oxygen tank. . Oxygen is very flammable and oxygen tanks will explode if they are exposed to open flame or a spark.  Please schedule for PFT's  Follow up in 2 weeks with Judson Roch NP  or Dr. Lamonte Sakai  after echo Please contact office for sooner follow up if symptoms do not improve or worsen or seek emergency care     TOBACCO USE She has decreased from 2 packs per day to 1 pack per day Plan Work on decreasing to 1 pack per day  Smoking cessation counseling per pharmacy Do not smoke around oxygen  Dyspnea on exertion ? If possible there could be a cardiac component to dyspnea  Plan Echo Referral to cardiology Follow up after echo in 2 weeks  Consider Trelegy at follow up Consider CT Chest at follow up  I am concerned there may be a Pulmonary Hypertension element to her dyspnea, in addition to her COPD. She endorses snoring and periods of witnessed apnea. She will need a sleep study at follow  up. She did qualify for oxygen today. I have placed the order. I have ordered an echo with  cardiology follow up.   Magdalen Spatz, NP 04/18/2019  4:45 PM

## 2019-04-18 ENCOUNTER — Other Ambulatory Visit: Payer: Self-pay

## 2019-04-18 ENCOUNTER — Encounter: Payer: Self-pay | Admitting: Acute Care

## 2019-04-18 ENCOUNTER — Ambulatory Visit (INDEPENDENT_AMBULATORY_CARE_PROVIDER_SITE_OTHER): Payer: Medicare Other

## 2019-04-18 ENCOUNTER — Ambulatory Visit (INDEPENDENT_AMBULATORY_CARE_PROVIDER_SITE_OTHER): Payer: Medicare Other | Admitting: Acute Care

## 2019-04-18 DIAGNOSIS — F17200 Nicotine dependence, unspecified, uncomplicated: Secondary | ICD-10-CM | POA: Diagnosis not present

## 2019-04-18 DIAGNOSIS — J449 Chronic obstructive pulmonary disease, unspecified: Secondary | ICD-10-CM

## 2019-04-18 DIAGNOSIS — R0609 Other forms of dyspnea: Secondary | ICD-10-CM

## 2019-04-18 NOTE — Assessment & Plan Note (Signed)
?   If possible there could be a cardiac component to dyspnea  Plan Echo Referral to cardiology Follow up after echo in 2 weeks

## 2019-04-18 NOTE — Assessment & Plan Note (Addendum)
She has decreased from 2 packs per day to 1 pack per day Plan Work on decreasing to 1 pack per day over the next month Smoking cessation counseling per pharmacy Do not smoke around oxygen

## 2019-04-18 NOTE — Assessment & Plan Note (Signed)
Dyspnea on exertion despite medical regimen compliance Plan We will do a CXR today We will call you with the results We will schedule a 2 D Echo to take a look at your heart.  Lets work on cutting  your cigarette smoking to one pack a day over the next month.   Continue the Stialto 2 puffs daily Rinse mouth after use. Continue Duonebs 1 treatment up to every 6 hours as  needed for shortness of breath or wheezing We will walk you today to see if we can get you qualified for oxygen.  You qualified for oxygen today.  Wear your oxygen at 2 L with exertion and at rest Saturation goals are 88-92% You cannot smoke while wearing your oxygen, or while near your oxygen tank. . Oxygen is very flammable and oxygen tanks will explode if they are exposed to open flame or a spark.  Please schedule for PFT's  Follow up in 2 weeks with Judson Roch NP  or Dr. Lamonte Sakai  after echo Please contact office for sooner follow up if symptoms do not improve or worsen or seek emergency care

## 2019-04-18 NOTE — Patient Instructions (Addendum)
It is good to see you today. I am so proud of you for decreasing your cigarette smoking to 1.5 packs per day.  We will do a CXR today We will call you with the results We will schedule a 2 D Echo to take a look at your heart.  We will schedule a sleep Study to evaluate for OSA after the echo is done Lets work on cutting  your cigarette smoking to one pack a day over the next month.   Continue the Stialto 2 puffs daily Rinse mouth after use. Continue Duonebs 1 treatment up to every 6 hours as  needed for shortness of breath or wheezing We will walk you today to see if we can get you qualified for oxygen.  You qualified for oxygen today.  Wear your oxygen at 2 L with exertion and at rest Saturation goals are 88-92% You cannot smoke while wearing your oxygen, or while near your oxygen tank. . Oxygen is very flammable and oxygen tanks will explode if they are exposed to open flame or a spark.  Referral to cardiology  Please schedule for PFT's  Follow up in 2 weeks with Judson Roch NP  or Dr. Lamonte Sakai  after echo Please contact office for sooner follow up if symptoms do not improve or worsen or seek emergency care

## 2019-04-19 ENCOUNTER — Encounter: Payer: Self-pay | Admitting: Acute Care

## 2019-04-20 ENCOUNTER — Other Ambulatory Visit (HOSPITAL_COMMUNITY): Payer: Medicare Other

## 2019-04-26 ENCOUNTER — Other Ambulatory Visit (HOSPITAL_COMMUNITY): Payer: Medicare Other

## 2019-05-03 ENCOUNTER — Ambulatory Visit (HOSPITAL_COMMUNITY): Payer: Medicare Other | Attending: Acute Care

## 2019-05-03 ENCOUNTER — Other Ambulatory Visit: Payer: Self-pay

## 2019-05-03 DIAGNOSIS — R0609 Other forms of dyspnea: Secondary | ICD-10-CM | POA: Diagnosis not present

## 2019-05-03 MED ORDER — PERFLUTREN LIPID MICROSPHERE
1.0000 mL | INTRAVENOUS | Status: AC | PRN
  Administered 2019-05-03: 2 mL via INTRAVENOUS

## 2019-05-08 ENCOUNTER — Other Ambulatory Visit: Payer: No Typology Code available for payment source

## 2019-05-09 ENCOUNTER — Ambulatory Visit: Payer: Medicare Other | Admitting: Acute Care

## 2019-05-14 ENCOUNTER — Telehealth: Payer: Self-pay | Admitting: Acute Care

## 2019-05-14 ENCOUNTER — Other Ambulatory Visit: Payer: Self-pay

## 2019-05-14 ENCOUNTER — Encounter: Payer: Self-pay | Admitting: Acute Care

## 2019-05-14 ENCOUNTER — Ambulatory Visit: Payer: Medicare Other | Admitting: Acute Care

## 2019-05-14 VITALS — BP 138/78 | HR 92 | Temp 98.3°F | Ht 62.0 in | Wt 214.0 lb

## 2019-05-14 DIAGNOSIS — F17210 Nicotine dependence, cigarettes, uncomplicated: Secondary | ICD-10-CM

## 2019-05-14 DIAGNOSIS — D649 Anemia, unspecified: Secondary | ICD-10-CM

## 2019-05-14 DIAGNOSIS — R0609 Other forms of dyspnea: Secondary | ICD-10-CM

## 2019-05-14 DIAGNOSIS — J9611 Chronic respiratory failure with hypoxia: Secondary | ICD-10-CM

## 2019-05-14 DIAGNOSIS — G4733 Obstructive sleep apnea (adult) (pediatric): Secondary | ICD-10-CM | POA: Insufficient documentation

## 2019-05-14 DIAGNOSIS — K7460 Unspecified cirrhosis of liver: Secondary | ICD-10-CM

## 2019-05-14 DIAGNOSIS — F17200 Nicotine dependence, unspecified, uncomplicated: Secondary | ICD-10-CM

## 2019-05-14 DIAGNOSIS — J449 Chronic obstructive pulmonary disease, unspecified: Secondary | ICD-10-CM

## 2019-05-14 DIAGNOSIS — R188 Other ascites: Secondary | ICD-10-CM

## 2019-05-14 LAB — HEPATIC FUNCTION PANEL
ALT: 48 U/L — ABNORMAL HIGH (ref 0–35)
AST: 91 U/L — ABNORMAL HIGH (ref 0–37)
Albumin: 3.8 g/dL (ref 3.5–5.2)
Alkaline Phosphatase: 91 U/L (ref 39–117)
Bilirubin, Direct: 0.1 mg/dL (ref 0.0–0.3)
Total Bilirubin: 0.4 mg/dL (ref 0.2–1.2)
Total Protein: 8.3 g/dL (ref 6.0–8.3)

## 2019-05-14 LAB — PROTIME-INR
INR: 1.1 ratio — ABNORMAL HIGH (ref 0.8–1.0)
Prothrombin Time: 12.6 s (ref 9.6–13.1)

## 2019-05-14 LAB — CBC WITH DIFFERENTIAL/PLATELET
Basophils Absolute: 0.1 10*3/uL (ref 0.0–0.1)
Basophils Relative: 1.4 % (ref 0.0–3.0)
Eosinophils Absolute: 0.1 10*3/uL (ref 0.0–0.7)
Eosinophils Relative: 1.3 % (ref 0.0–5.0)
HCT: 44.5 % (ref 36.0–46.0)
Hemoglobin: 14.4 g/dL (ref 12.0–15.0)
Lymphocytes Relative: 18.7 % (ref 12.0–46.0)
Lymphs Abs: 1.6 10*3/uL (ref 0.7–4.0)
MCHC: 32.5 g/dL (ref 30.0–36.0)
MCV: 97.7 fl (ref 78.0–100.0)
Monocytes Absolute: 1 10*3/uL (ref 0.1–1.0)
Monocytes Relative: 11.5 % (ref 3.0–12.0)
Neutro Abs: 5.7 10*3/uL (ref 1.4–7.7)
Neutrophils Relative %: 67.1 % (ref 43.0–77.0)
Platelets: 247 10*3/uL (ref 150.0–400.0)
RBC: 4.55 Mil/uL (ref 3.87–5.11)
RDW: 13.8 % (ref 11.5–15.5)
WBC: 8.4 10*3/uL (ref 4.0–10.5)

## 2019-05-14 LAB — BRAIN NATRIURETIC PEPTIDE: Pro B Natriuretic peptide (BNP): 52 pg/mL (ref 0.0–100.0)

## 2019-05-14 LAB — AMMONIA: Ammonia: 35 umol/L (ref 11–35)

## 2019-05-14 NOTE — Progress Notes (Signed)
History of Present Illness [name] [name] is a 68 y.o. female current every day  smoker with a  history of heavy tobacco use (2 packs a day, 86 pack years), alcohol use with cirrhosis, hypertension with diastolic CHF.Carries a history of COPD, pulmonary function testing from 03/30/2011 reviewed and show mild obstruction without a bronchodilator response, normal volumes, decreased diffusion capacity that corrects to the normal range when adjusted for alveolar volume. CXR  does show hyperinflation and some mild bronchitic change bilaterally.  04/18/2019>> Seen By Dr. Lamonte Sakai, started on Stialto,Continuing Duonebs as needed for breakthrough shortness of breath   05/14/2019  Pt  Presents for follow up.She is with her daughter. She states she is no better no worse. She is compliant with her pulmonary regiment of DuoNeb tid and  Dulera. She feels that the nebs benefit her. She uses them about once daily.She continues to smoke 1 ppd . This is down from 2 PPD. She was extensively counseled on smoking cessation.We qualified her for oxygen at the last OV. She is compliant wearing her oxygen at 2 l [place]. Her saturation goals are 88-92%. Saturations were 96% on her oxygen today in the office.She states she feels much better on her oxygen. We discussed that she must not smoke around her oxygen. She verbalized understanding.  She denies any cough or increase in secretions She does have occasional wheezing She still has abdominal fullness which is mpst likely her cirrhosis.  Concern for heart failure is  less based on recent echo and BNP.Marland KitchenShe is seen by Dr. Butch Penny [facility]). LFT's at last check were elevated.She has follow up within the month She denies  swelling in feet or legs, hands or arms She endorses  Bruising more easily. coags are WNL She does have a history of ETOH abuse , admission 03/06/2019 ETOH level was 22 She denies any fever, chest pain, orthopnea or hemoptysis.    Test Results: Echo 05/03/2019  Left Ventricle: The left ventricle has normal systolic function, with an ejection fraction of 55-60%. The cavity size was normal. There is mildly increased left ventricular wall thickness. Left ventricular diastolic Doppler parameters are consistent with impaired relaxation. No evidence of left ventricular regional wall motion abnormalities.. Definity contrast agent was given IV to delineate the left ventricular endocardial borders. Right Ventricle: The right ventricle has normal systolic function. The cavity was normal. There is no increase in right ventricular wall thickness. Left Atrium: Left atrial size was normal in size. Right Atrium: Right atrial size was normal in size. Interatrial Septum: The interatrial septum was not well visualized.  05/14/2019 BNP>> 52  CXR 03/2019 Normal heart size, mediastinal contours, and pulmonary vascularity. Peribronchial thickening and slight hyperinflation question COPD. No acute infiltrate, pleural effusion or pneumothorax. Osseous demineralization. Reverse RIGHT shoulder prosthesis.  IMPRESSION: Question COPD changes. No acute abnormalities.  PFT 03/30/2011 FVC 3.01 L or 112% FEV1 1.95 L or 98% FEF 25-75% 0.83 L/s or 34%  TLC 4.67 L or 114% DLCO 12.7 or 65% Mild obstructive lung defect. Airway obstruction confirmed by decrease in flow rate at 50% and 75% of the flow volume curve. Lung Volumes are within normal limits, Mild decrease in diffusing capacity, minimal BD response  Hepatic Function Latest Ref Rng & Units 05/14/2019 03/06/2019 06/26/2014  Total Protein 6.0 - 8.3 g/dL 8.3 8.8(H) 7.9  Albumin 3.5 - 5.2 g/dL 3.8 3.3(L) 3.2(L)  AST 0 - 37 U/L 91(H) 89(H) 95(H)  ALT 0 - 35 U/L 48(H) 55(H) 74(H)  Alk Phosphatase 39 - 117 U/L 91 78 70  Total Bilirubin 0.2 - 1.2 mg/dL 0.4 0.9 0.5  Bilirubin, Direct 0.0 - 0.3 mg/dL 0.1 - -   Ammonia 05/14/2019 35  05/14/2019>> INR>> 1.1 05/14/2019>> PT>> 12.6  CBC Latest Ref Rng & Units 05/14/2019  03/06/2019 06/25/2014  WBC 4.0 - 10.5 K/uL 8.4 14.4(H) 7.4  Hemoglobin 12.0 - 15.0 g/dL 14.4 14.4 12.3  Hematocrit 36.0 - 46.0 % 44.5 45.3 37.5  Platelets 150.0 - 400.0 K/uL 247.0 244 197    BMP Latest Ref Rng & Units 03/06/2019 03/06/2019 06/26/2014  Glucose 70 - 99 mg/dL 105(H) 103(H) 103(H)  BUN 8 - 23 mg/dL 7(L) 7(L) 18  Creatinine 0.44 - 1.00 mg/dL 0.82 0.83 0.53  Sodium 135 - 145 mmol/L 136 136 141  Potassium 3.5 - 5.1 mmol/L 3.9 3.9 4.1  Chloride 98 - 111 mmol/L 102 101 104  CO2 22 - 32 mmol/L 17(L) 18(L) 24  Calcium 8.9 - 10.3 mg/dL 9.7 9.8 9.3    BNP No results found for: BNP  ProBNP    Component Value Date/Time   PROBNP 52.0 05/14/2019 1242    PFT No results found for: FEV1PRE, FEV1POST, FVCPRE, FVCPOST, TLC, DLCOUNC, PREFEV1FVCRT, PSTFEV1FVCRT  Dg Chest 2 View  Result Date: 04/18/2019 CLINICAL DATA:  COPD EXAM: CHEST - 2 VIEW COMPARISON:  03/06/2019 FINDINGS: Normal heart size, mediastinal contours, and pulmonary vascularity. Peribronchial thickening and slight hyperinflation question COPD. No acute infiltrate, pleural effusion or pneumothorax. Osseous demineralization. Reverse RIGHT shoulder prosthesis. IMPRESSION: Question COPD changes. No acute abnormalities. Electronically Signed   By: Lavonia Dana M.D.   On: 04/18/2019 17:11     Past medical hx Past Medical History:  Diagnosis Date  . Anemia    hx of-yrs ago  . Anxiety   . Arthritis   . Asthma    uses Albuterol inhaler as needed  . Cirrhosis ([place])   . COPD (chronic obstructive pulmonary disease) ([place])   . Depression    takes Viibryd daily  . Diastolic congestive heart failure ([place])   . ETOH abuse   . Family history of anesthesia complication    sister gets sick with anesthesia  . Headache(784.0)    occasionally  . HTN (hypertension)    takes Lisinopril daily  . Joint pain   . Joint swelling   . Nocturia   . Pneumonia    about 2 yrs ago  . Respiratory failure with hypoxia ([place])   . Systemic  inflammatory response syndrome (HCC)   . Urinary frequency   . Weakness    numbness and tingling right hand     Social History   Tobacco Use  . Smoking status: Current Every Day Smoker    Packs/day: 2.00    Years: 43.00    Pack years: 86.00    Types: Cigarettes  . Smokeless tobacco: Never Used  . Tobacco comment: relapsed smoking june 2012  Substance Use Topics  . Alcohol use: Yes    Comment: 12  to 18 pack per day  . Drug use: No    Ms.Schnackenberg reports that she has been smoking cigarettes. She has a 86.00 pack-year smoking history. She has never used smokeless tobacco. She reports current alcohol use. She reports that she does not use drugs.  Tobacco Cessation: 86 pack year smoking history Current every day smoker Currently smokes 1 PPD x last month Previously smoked 2PPD  I have spent 3 minutes counseling patient on smoking cessation this visit. Patient verbalizes  understanding of their  choice to continue smoking and the negative health consequences including worsening of COPD, risk of lung cancer , stroke and heart disease.Marland Kitchen     Past surgical hx, Family hx, Social hx all reviewed.  Current Outpatient Medications on File Prior to Visit  Medication Sig  . albuterol (PROAIR HFA) 108 (90 Base) MCG/ACT inhaler Inhale 2 puffs into the lungs every 4 (four) hours as needed for wheezing or shortness of breath.  . Aspirin-Salicylamide-Caffeine (BC HEADACHE POWDER PO) Take 1 packet by mouth as needed (for headaches).  . Dextran 70-Hypromellose, PF, (ARTIFICIAL TEARS PF) 0.1-0.3 % SOLN Place 1-2 drops into both eyes 3 (three) times daily as needed (for dryness).  . folic acid (FOLVITE) 1 MG tablet Take 1 tablet (1 mg total) by mouth daily.  Marland Kitchen GLUCOSAMINE-CHONDROITIN PO Take 2 tablets by mouth daily with breakfast.  . ipratropium-albuterol (DUONEB) 0.5-2.5 (3) MG/3ML SOLN Take 3 mLs by nebulization every 6 (six) hours.  Marland Kitchen lactulose (CHRONULAC) 10 GM/15ML solution Take 15 mLs (10 g  total) by mouth 3 (three) times daily.  . mometasone-formoterol (DULERA) 100-5 MCG/ACT AERO Inhale 2 puffs into the lungs 2 (two) times daily as needed for wheezing or shortness of breath.  . Multiple Vitamins-Minerals (ONE-A-DAY WOMENS 50+ ADVANTAGE) TABS Take 1 tablet by mouth daily with breakfast.  . sertraline (ZOLOFT) 100 MG tablet Take 200 mg by mouth daily.  . Tiotropium Bromide-Olodaterol (STIOLTO RESPIMAT) 2.5-2.5 MCG/ACT AERS Inhale 2 puffs into the lungs daily.  Marland Kitchen amLODipine (NORVASC) 5 MG tablet Take 1 tablet (5 mg total) by mouth daily. (Patient not taking: Reported on 05/14/2019)  . LORazepam (ATIVAN) 0.5 MG tablet Take 1 tablet (0.5 mg total) by mouth every 6 (six) hours as needed for anxiety. (Patient not taking: Reported on 05/14/2019)  . nicotine (NICODERM CQ - DOSED IN MG/24 HOURS) 21 mg/24hr patch Place 1 patch (21 mg total) onto the skin daily. (Patient not taking: Reported on 05/14/2019)  . thiamine 100 MG tablet Take 1 tablet (100 mg total) by mouth daily. (Patient not taking: Reported on 05/14/2019)  . Vilazodone HCl (VIIBRYD) 40 MG TABS Take 40 mg by mouth daily.   No current facility-administered medications on file prior to visit.      Allergies  Allergen Reactions  . Penicillins   . Adhesive [Tape] Itching and Rash    Causes skin to itch and break out  . Codeine Itching and Rash    Review Of Systems:  Constitutional:   No  weight loss, night sweats,  Fevers, chills, fatigue, or  lassitude.  HEENT:   No headaches,  Difficulty swallowing,  Tooth/dental problems, or  Sore throat,                No sneezing, itching, ear ache, nasal congestion, post nasal drip,   CV:  No chest pain,  Orthopnea, PND, swelling in lower extremities, anasarca, dizziness, palpitations, syncope.   GI  No heartburn, indigestion, abdominal pain, nausea, vomiting, diarrhea, change in bowel habits, loss of appetite, bloody stools., + abdominal fullness   Resp: + shortness of breath with  exertion less  at rest.  No excess mucus, no productive cough,  No non-productive cough,  No coughing up of blood.  No change in color of mucus.  Occasional  wheezing.  No chest wall deformity  Skin: no rash or lesions.  GU: no dysuria, change in color of urine, no urgency or frequency.  No flank pain, no hematuria   MS:  No joint pain or swelling.  No decreased range of motion.  No back pain.  Psych:  No change in mood or affect. No depression or anxiety.  No memory loss.   Vital Signs BP 138/78 (BP Location: Right Arm)   Pulse 92   Temp 98.3 F (36.8 C) (Oral)   Ht '5\' 2"'$  (1.575 m)   Wt 214 lb (97.1 kg)   SpO2 96%   BMI 39.14 kg/m    Physical Exam:  General- No distress,  A&Ox3, pleasant ENT: No sinus tenderness, TM clear, pale nasal mucosa, no oral exudate,no post nasal drip, no LAN Cardiac: S1, S2, regular rate and rhythm, no murmur Chest: No wheeze/ rales/ dullness; no accessory muscle use, no nasal flaring, no sternal retractions Abd.: Soft Non-tender, slight distension,BS +, Body mass index is 39.14 kg/m. Ext: No clubbing cyanosis, edema Neuro: deconditioned at baseline, MAE x 4, A&O x 3 Skin: No rashes,no lesions, + bruising, warm and dry Psych: normal mood and behavior   Assessment/Plan  COPD (chronic obstructive pulmonary disease) Dyspnea PFT's 2012>> Mild obstructive lung defect. Airway obstruction confirmed by decrease in flow rate at 50% and 75% of the flow volume curve. Lung Volumes are within normal limits, Mild decrease in diffusing capacity, minimal BD response Plan PFT's as scheduled to optimize inhaler regimen>>  We will do a therapeutic trial of Trelegy Use this one puff once daily Rinse mouth after use If you do not like this medication, go back to your Dulera 2 puffs twice  daily Rinse mouth after use Continue Duonebs as you have been doing for breakthrough shortness of breath or wheezing Continue oxygen at 2 L [place] Saturation goals are 88-92% Do  not smoke while wearing oxygen as tank can explode and cause death Continue to work on quitting smoking  Referral for Lung Cancer Screening Follow up in 1 momth Please contact office for sooner follow up if symptoms do not improve or worsen or seek emergency care   Cardiology consult to ensure no cardiac element to dyspnea BNP 52   Chronic respiratory failure with hypoxia (HCC) Wear oxygen aat 2 L [place] Saturation goals re 88-92%  TOBACCO USE Continue to decrease number of cigarettes daily Do not wear oxygen while smoking  Referral for Lung Cancer Screening program  ANEMIA-NOS HGB 14.4  Cirrhosis (HCC) Check LFT's INR PT Ammonia Follow up with GI MD as is scheduled  OSA (obstructive sleep apnea) Poor sleep Daytime sleepiness Plan Will order a sleep study to evaluate for OSA    Magdalen Spatz, NP 05/14/2019  11:59 PM

## 2019-05-14 NOTE — Assessment & Plan Note (Addendum)
Check LFT's INR PT Ammonia Follow up with GI MD as is scheduled

## 2019-05-14 NOTE — Assessment & Plan Note (Signed)
Poor sleep Daytime sleepiness Plan Will order a sleep study to evaluate for OSA

## 2019-05-14 NOTE — Patient Instructions (Addendum)
It is nice to see you today. We will order labs today BNP CBC with diff PT/INR LFT's Ammonia Level PFT's as scheduled  Continue Duonebs as you have been doing Continue to work on quitting smoking  Referral to [place] Please don't smoke while wearing oxygen. Referral to cardiology for evaluation of  Dyspnea We will do a therapeutic trial of Trelegy Use this one puff once daily Rinse mouth after use If you do not like this medication, go back to your [facility]  2 puffs twice  daily One of the nurses will teach you how to use the inhaler. We will order a sleep study for OSA Follow up in 1 month with Judson Roch NP or Dr. Lamonte Sakai  Please contact office for sooner follow up if symptoms do not improve or worsen or seek emergency care

## 2019-05-14 NOTE — Assessment & Plan Note (Addendum)
Dyspnea PFT's 2012>> Mild obstructive lung defect. Airway obstruction confirmed by decrease in flow rate at 50% and 75% of the flow volume curve. Lung Volumes are within normal limits, Mild decrease in diffusing capacity, minimal BD response Plan PFT's as scheduled to optimize inhaler regimen>>  We will do a therapeutic trial of Trelegy Use this one puff once daily Rinse mouth after use If you do not like this medication, go back to your Dulera 2 puffs twice  daily Rinse mouth after use Continue Duonebs as you have been doing for breakthrough shortness of breath or wheezing Continue oxygen at 2 L [place] Saturation goals are 88-92% Do not smoke while wearing oxygen as tank can explode and cause death Continue to work on quitting smoking  Referral for Lung Cancer Screening Follow up in 1 momth Please contact office for sooner follow up if symptoms do not improve or worsen or seek emergency care   Cardiology consult to ensure no cardiac element to dyspnea BNP 52

## 2019-05-14 NOTE — Telephone Encounter (Signed)
Attn Pattrice:  Pt has current order in epic for PFT.  Just making sure you have pt on the list for covid screening.

## 2019-05-14 NOTE — Progress Notes (Signed)
Patient seen in the office today and instructed on use of Trelegy.  Patient expressed understanding and demonstrated technique.   Doroteo Glassman, RN 05/14/19

## 2019-05-14 NOTE — Assessment & Plan Note (Addendum)
Continue to decrease number of cigarettes daily Do not wear oxygen while smoking  Referral for Lung Cancer Screening program

## 2019-05-14 NOTE — Assessment & Plan Note (Signed)
HGB: 14.4

## 2019-05-14 NOTE — Assessment & Plan Note (Signed)
Wear oxygen aat 2 L [place] Saturation goals re 88-92%

## 2019-05-17 NOTE — Telephone Encounter (Signed)
Called and left message on pt vm to call back to schedule pft and f/u with RB -pr

## 2019-05-18 ENCOUNTER — Other Ambulatory Visit: Payer: Self-pay | Admitting: *Deleted

## 2019-05-18 DIAGNOSIS — Z87891 Personal history of nicotine dependence: Secondary | ICD-10-CM

## 2019-05-18 DIAGNOSIS — F17210 Nicotine dependence, cigarettes, uncomplicated: Secondary | ICD-10-CM

## 2019-05-18 DIAGNOSIS — Z122 Encounter for screening for malignant neoplasm of respiratory organs: Secondary | ICD-10-CM

## 2019-05-18 NOTE — Telephone Encounter (Signed)
Scheduled patient for pft on 06/19/2019-pr

## 2019-05-30 ENCOUNTER — Ambulatory Visit (INDEPENDENT_AMBULATORY_CARE_PROVIDER_SITE_OTHER): Payer: Medicare Other | Admitting: Acute Care

## 2019-05-30 ENCOUNTER — Ambulatory Visit
Admission: RE | Admit: 2019-05-30 | Discharge: 2019-05-30 | Disposition: A | Discharge status: Home / Self Care | Payer: Medicare Other | Source: Ambulatory Visit | Attending: Acute Care | Admitting: Acute Care

## 2019-05-30 ENCOUNTER — Other Ambulatory Visit: Payer: Self-pay

## 2019-05-30 ENCOUNTER — Encounter: Payer: Self-pay | Admitting: Acute Care

## 2019-05-30 VITALS — BP 158/84 | HR 108 | Temp 97.0°F | Ht 62.0 in | Wt 208.8 lb

## 2019-05-30 DIAGNOSIS — F17210 Nicotine dependence, cigarettes, uncomplicated: Secondary | ICD-10-CM

## 2019-05-30 DIAGNOSIS — Z122 Encounter for screening for malignant neoplasm of respiratory organs: Secondary | ICD-10-CM

## 2019-05-30 DIAGNOSIS — Z87891 Personal history of nicotine dependence: Secondary | ICD-10-CM

## 2019-05-30 NOTE — Progress Notes (Signed)
Shared Decision Making Visit Lung Cancer Screening Program [phone]([id number])   Eligibility:  Age 68 y.o.  Pack Years Smoking History Calculation: 86 pack year smoking history  (# packs/per year x # years smoked)  Recent History of coughing up blood  no  Unexplained weight loss? no ( >Than 15 pounds within the last 6 months )  Prior History Lung / other cancer no (Diagnosis within the last 5 years already requiring surveillance chest CT Scans).  Smoking Status Current Smoker  Former Smokers: Years since quit:NA  Quit Date: NA  Visit Components:  Discussion included one or more decision making aids. yes  Discussion included risk/benefits of screening. yes  Discussion included potential follow up diagnostic testing for abnormal scans. yes  Discussion included meaning and risk of over diagnosis. yes  Discussion included meaning and risk of False Positives. yes  Discussion included meaning of total radiation exposure. yes  Counseling Included:  Importance of adherence to annual lung cancer LDCT screening. yes  Impact of comorbidities on ability to participate in the program. yes  Ability and willingness to under diagnostic treatment. yes  Smoking Cessation Counseling:  Current Smokers:   Discussed importance of smoking cessation. yes  Information about tobacco cessation classes and interventions provided to patient. yes  Patient provided with "ticket" for LDCT Scan. yes  Symptomatic Patient. no  Counseling NA  Diagnosis Code: Tobacco Use Z72.0  Asymptomatic Patient yes  Counseling (Intermediate counseling: > three minutes counseling) [id number]  Former Smokers:   Discussed the importance of maintaining cigarette abstinence. yes  Diagnosis Code: Personal History of Nicotine Dependence. U98.119Z87.891  Information about tobacco cessation classes and interventions provided to patient. Yes  Patient provided with "ticket" for LDCT Scan. yes  Written Order for Lung Cancer  Screening with LDCT placed in Epic. Yes (CT Chest Lung Cancer Screening Low Dose W/O CM) [id number] Z12.2-Screening of respiratory organs Z87.891-Personal history of nicotine dependence  BP (!) 158/84 (BP Location: Left Arm, Cuff Size: Normal)   Pulse (!) 108   Temp (!) 97 F (36.1 C) (Oral)   Ht 5\' 2"  (1.575 m)   Wt 208 lb 12.8 oz (94.7 kg)   SpO2 94%   BMI 38.19 kg/m   I have spent 25 minutes of face to face time with [name] [name] discussing the risks and benefits of lung cancer screening. We viewed a power point together that explained in detail the above noted topics. We paused at intervals to allow for questions to be asked and answered to ensure understanding.We discussed that the single most powerful action that she can take to decrease her risk of developing lung cancer is to quit smoking. We discussed whether or not she is ready to commit to setting a quit date. We discussed options for tools to aid in quitting smoking including nicotine replacement therapy, non-nicotine medications, support groups, Quit Smart classes, and behavior modification. We discussed that often times setting smaller, more achievable goals, such as eliminating 1 cigarette a day for a week and then 2 cigarettes a day for a week can be helpful in slowly decreasing the number of cigarettes smoked. This allows for a sense of accomplishment as well as providing a clinical benefit. I gave her the " Be Stronger Than Your Excuses" card with contact information for community resources, classes, free nicotine replacement therapy, and access to mobile apps, text messaging, and on-line smoking cessation help. I have also given her my card and contact information in the event she needs to contact me.  We discussed the time and location of the scan, and that either [name] Glassman RN or I will call with the results within 24-48 hours of receiving them. I have offered her  a copy of the power point we viewed  as a resource in the event they  need reinforcement of the concepts we discussed today in the office. The patient verbalized understanding of all of  the above and had no further questions upon leaving the office. They have my contact information in the event they have any further questions.  I spent 5 minutes counseling on smoking cessation and the health risks of continued tobacco abuse.  I explained to the patient that there has been a high incidence of coronary artery disease noted on these exams. I explained that this is a non-gated exam therefore degree or severity cannot be determined. This patient is not on statin therapy. I have asked the patient to follow-up with their PCP regarding any incidental finding of coronary artery disease and management with diet or medication as their PCP  feels is clinically indicated. The patient verbalized understanding of the above and had no further questions upon completion of the visit.      Magdalen Spatz, NP 05/30/2019 12:14 PM

## 2019-05-30 NOTE — Patient Instructions (Signed)
Thank you for participating in the [facility]. It was our pleasure to meet you today. We will call you with the results of your scan within the next few days. Your scan will be assigned a Lung RADS category score by the physicians reading the scans.  This Lung RADS score determines follow up scanning.  See below for description of categories, and follow up screening recommendations. We will be in touch to schedule your follow up screening annually or based on recommendations of our providers. We will fax a copy of your scan results to your Primary Care Physician, or the physician who referred you to the program, to ensure they have the results. Please call the office if you have any questions or concerns regarding your scanning experience or results.  Our office number is [phone]. Please speak with Denise Phelps, RN. She is our Lung Cancer Screening RN. If she is unavailable when you call, please have the office staff send her a message. She will return your call at her earliest convenience. Remember, if your scan is normal, we will scan you annually as long as you continue to meet the criteria for the program. (Age 55-77, Current smoker or smoker who has quit within the last 15 years). If you are a smoker, remember, quitting is the single most powerful action that you can take to decrease your risk of lung cancer and other pulmonary, breathing related problems. We know quitting is hard, and we are here to help.  Please let us know if there is anything we can do to help you meet your goal of quitting. If you are a former smoker, congratulations. We are proud of you! Remain smoke free! Remember you can refer friends or family members through the number above.  We will screen them to make sure they meet criteria for the program. Thank you for helping us take better care of you by participating in Lung Screening.  Lung RADS Categories:  Lung RADS 1: no nodules  or definitely non-concerning nodules.  Recommendation is for a repeat annual scan in 12 months.  Lung RADS 2:  nodules that are non-concerning in appearance and behavior with a very low likelihood of becoming an active cancer. Recommendation is for a repeat annual scan in 12 months.  Lung RADS 3: nodules that are probably non-concerning , includes nodules with a low likelihood of becoming an active cancer.  Recommendation is for a 6-month repeat screening scan. Often noted after an upper respiratory illness. We will be in touch to make sure you have no questions, and to schedule your 6-month scan.  Lung RADS 4 A: nodules with concerning findings, recommendation is most often for a follow up scan in 3 months or additional testing based on our provider's assessment of the scan. We will be in touch to make sure you have no questions and to schedule the recommended 3 month follow up scan.  Lung RADS 4 B:  indicates findings that are concerning. We will be in touch with you to schedule additional diagnostic testing based on our provider's  assessment of the scan.   

## 2019-06-01 ENCOUNTER — Other Ambulatory Visit: Payer: Self-pay | Admitting: *Deleted

## 2019-06-01 DIAGNOSIS — Z122 Encounter for screening for malignant neoplasm of respiratory organs: Secondary | ICD-10-CM

## 2019-06-01 DIAGNOSIS — Z87891 Personal history of nicotine dependence: Secondary | ICD-10-CM

## 2019-06-01 DIAGNOSIS — F17210 Nicotine dependence, cigarettes, uncomplicated: Secondary | ICD-10-CM

## 2019-06-11 ENCOUNTER — Other Ambulatory Visit: Payer: Self-pay | Admitting: Emergency Medicine

## 2019-06-15 ENCOUNTER — Other Ambulatory Visit (HOSPITAL_COMMUNITY)
Admission: RE | Admit: 2019-06-15 | Discharge: 2019-06-15 | Disposition: A | Discharge status: Home / Self Care | Payer: Medicare Other | Source: Ambulatory Visit | Attending: Emergency Medicine | Admitting: Emergency Medicine

## 2019-06-15 DIAGNOSIS — Z20828 Contact with and (suspected) exposure to other viral communicable diseases: Secondary | ICD-10-CM | POA: Insufficient documentation

## 2019-06-15 DIAGNOSIS — Z01812 Encounter for preprocedural laboratory examination: Secondary | ICD-10-CM | POA: Insufficient documentation

## 2019-06-16 LAB — NOVEL CORONAVIRUS, NAA (HOSP ORDER, SEND-OUT TO REF LAB; TAT 18-24 HRS): SARS-CoV-2, NAA: NOT DETECTED

## 2019-06-19 ENCOUNTER — Ambulatory Visit (INDEPENDENT_AMBULATORY_CARE_PROVIDER_SITE_OTHER): Payer: Medicare Other | Admitting: Emergency Medicine

## 2019-06-19 ENCOUNTER — Encounter: Payer: Self-pay | Admitting: Emergency Medicine

## 2019-06-19 ENCOUNTER — Other Ambulatory Visit: Payer: Self-pay

## 2019-06-19 ENCOUNTER — Ambulatory Visit: Payer: Medicare Other | Admitting: Emergency Medicine

## 2019-06-19 DIAGNOSIS — J449 Chronic obstructive pulmonary disease, unspecified: Secondary | ICD-10-CM

## 2019-06-19 DIAGNOSIS — Z23 Encounter for immunization: Secondary | ICD-10-CM

## 2019-06-19 DIAGNOSIS — J9611 Chronic respiratory failure with hypoxia: Secondary | ICD-10-CM | POA: Diagnosis not present

## 2019-06-19 LAB — PULMONARY FUNCTION TEST
DL/VA % pred: 86 %
DL/VA: 3.67 ml/min/mmHg/L
DLCO unc % pred: 79 %
DLCO unc: 14.5 ml/min/mmHg
FEF 25-75 Post: 0.85 L/sec
FEF 25-75 Pre: 0.49 L/sec
FEF2575-%Change-Post: 73 %
FEF2575-%Pred-Post: 42 %
FEF2575-%Pred-Pre: 24 %
FEV1-%Change-Post: 13 %
FEV1-%Pred-Post: 63 %
FEV1-%Pred-Pre: 55 %
FEV1-Post: 1.39 L
FEV1-Pre: 1.22 L
FEV1FVC-%Change-Post: -1 %
FEV1FVC-%Pred-Pre: 75 %
FEV6-%Change-Post: 16 %
FEV6-%Pred-Post: 85 %
FEV6-%Pred-Pre: 73 %
FEV6-Post: 2.36 L
FEV6-Pre: 2.02 L
FEV6FVC-%Change-Post: 1 %
FEV6FVC-%Pred-Post: 101 %
FEV6FVC-%Pred-Pre: 100 %
FVC-%Change-Post: 15 %
FVC-%Pred-Post: 84 %
FVC-%Pred-Pre: 73 %
FVC-Post: 2.42 L
FVC-Pre: 2.1 L
Post FEV1/FVC ratio: 57 %
Post FEV6/FVC ratio: 97 %
Pre FEV1/FVC ratio: 58 %
Pre FEV6/FVC Ratio: 96 %
RV % pred: 135 %
RV: 2.66 L
TLC % pred: 104 %
TLC: 4.87 L

## 2019-06-19 NOTE — Patient Instructions (Addendum)
Please continue Trelegy once daily. Rinse and gargle after using.  Keep albuterol available to use 2 puffs or one nebulizer treatment up to every 4 hours if needed for shortness of breath. You may want to try pre-treating exertion with albuterol.  Flu shot today Pneumonia shot up to date for now Continue oxygen at 2L/min with exertion.  Follow with Dr Lamonte Sakai in 6 months or sooner if you have any problems

## 2019-06-19 NOTE — Progress Notes (Signed)
Subjective:    Patient ID: [name] [name], female    DOB: May 09, 1951, 68 y.o.   MRN: [id number]  HPI 68 year old woman with a history of heavy tobacco use (2 packs a day, 86 pack years), alcohol use with cirrhosis, hypertension with diastolic CHF.  Carries a history of COPD, pulmonary function testing from 03/30/2011 reviewed and show mild obstruction without a bronchodilator response, normal volumes, decreased diffusion capacity that corrects to the normal range when adjusted for alveolar volume.  She had a chest x-ray on 03/06/2019 that I reviewed that does not show any focal infiltrates, does show hyperinflation and some mild bronchitic change bilaterally.  She is referred today for progression of exertional SOB since Oct 27, 2022. Her husband died around that time. She has cough prod of clear. She hears wheeze often. She is somewhat limited with her activity, has to stop to rest when doing housework. She can shop with a handicap cart.   She is using DuoNeb tid. Also has Dulera.  She feels that the nebs benefit her. She has been on stiolto before, felt benefit but was unable to afford.   She is smoking 2 pks a day, she is contemplating cutting down. Willing to try to get to 1.5 pk/day.   ROV 06/19/2019 --68 year old smoker (86 pack years), alcoholic cirrhosis, hypertension with diastolic dysfunction, COPD.  I saw her for progressive exertional dyspnea earlier this summer.  I tried changing her to [facility] and since then she has been seen here and transition to Trelegy.  She reports that the Trelegy has been helpful - much less exertional SOB. Has a bad taste - rinses her throat. She does have cough, prod of clear. Some wheeze, probably less. Able to walk. Uses O2 at 2L/min pulsed.  She underwent pulmonary function testing today which I reviewed and which shows severe obstruction with a positive bronchodilator response, hyperinflated volumes and a slightly decreased diffusion capacity that corrects to the  normal range when adjusted for alveolar volume. PNA up to date until after age 50  She underwent CT chest for lung CA screening 05/30/2019 >> RADS 2, with only a 1.63mm  RUL subpleural nodule   Review of Systems  Respiratory: Positive for cough and shortness of breath.   Neurological: Positive for headaches.  Psychiatric/Behavioral: Positive for dysphoric mood. The patient is nervous/anxious.    Past Medical History:  Diagnosis Date  . Anemia    hx of-yrs ago  . Anxiety   . Arthritis   . Asthma    uses Albuterol inhaler as needed  . Cirrhosis (HCC)   . COPD (chronic obstructive pulmonary disease) (HCC)   . Depression    takes Viibryd daily  . Diastolic congestive heart failure (HCC)   . ETOH abuse   . Family history of anesthesia complication    sister gets sick with anesthesia  . Headache(784.0)    occasionally  . HTN (hypertension)    takes Lisinopril daily  . Joint pain   . Joint swelling   . Nocturia   . Pneumonia    about 2 yrs ago  . Respiratory failure with hypoxia (HCC)   . Systemic inflammatory response syndrome (HCC)   . Urinary frequency   . Weakness    numbness and tingling right hand     Family History  Problem Relation Age of Onset  . Heart disease Father   . Lung cancer Father   . Diabetes Father   . Asthma Father   . Alcoholism Father   .  Lung cancer Mother      Social History   Socioeconomic History  . Marital status: Divorced    Spouse name: Not on file  . Number of children: Not on file  . Years of education: Not on file  . Highest education level: Not on file  Occupational History  . Occupation: retired  Scientific laboratory technician  . Financial resource strain: Not on file  . Food insecurity    Worry: Not on file    Inability: Not on file  . Transportation needs    Medical: Not on file    Non-medical: Not on file  Tobacco Use  . Smoking status: Current Every Day Smoker    Packs/day: 2.00    Years: 43.00    Pack years: 86.00    Types:  Cigarettes  . Smokeless tobacco: Never Used  . Tobacco comment: relapsed smoking june 2012  Substance and Sexual Activity  . Alcohol use: Yes    Comment: 12  to 18 pack per day  . Drug use: No  . Sexual activity: Not Currently    Birth control/protection: Surgical  Lifestyle  . Physical activity    Days per week: Not on file    Minutes per session: Not on file  . Stress: Not on file  Relationships  . Social Herbalist on phone: Not on file    Gets together: Not on file    Attends religious service: Not on file    Active member of club or organization: Not on file    Attends meetings of clubs or organizations: Not on file    Relationship status: Not on file  . Intimate partner violence    Fear of current or ex partner: Not on file    Emotionally abused: Not on file    Physically abused: Not on file    Forced sexual activity: Not on file  Other Topics Concern  . Not on file  Social History Narrative   Pt was adopted at age 71 but still is in contact with biological family.      Allergies  Allergen Reactions  . Penicillins   . Adhesive [Tape] Itching and Rash    Causes skin to itch and break out  . Codeine Itching and Rash     Outpatient Medications Prior to Visit  Medication Sig Dispense Refill  . albuterol (PROAIR HFA) 108 (90 Base) MCG/ACT inhaler Inhale 2 puffs into the lungs every 4 (four) hours as needed for wheezing or shortness of breath.    . Aspirin-Salicylamide-Caffeine (BC HEADACHE POWDER PO) Take 1 packet by mouth as needed (for headaches).    . Dextran 70-Hypromellose, PF, (ARTIFICIAL TEARS PF) 0.1-0.3 % SOLN Place 1-2 drops into both eyes 3 (three) times daily as needed (for dryness).    . Fluticasone-Umeclidin-Vilant (TRELEGY ELLIPTA) 100-62.5-25 MCG/INH AEPB Inhale 1 puff into the lungs daily.    . folic acid (FOLVITE) 1 MG tablet Take 1 tablet (1 mg total) by mouth daily. 30 tablet 0  . GLUCOSAMINE-CHONDROITIN PO Take 2 tablets by mouth daily  with breakfast.    . ipratropium-albuterol (DUONEB) 0.5-2.5 (3) MG/3ML SOLN Take 3 mLs by nebulization every 6 (six) hours.    Marland Kitchen lactulose (CHRONULAC) 10 GM/15ML solution Take 15 mLs (10 g total) by mouth 3 (three) times daily. 236 mL 0  . Multiple Vitamins-Minerals (ONE-A-DAY WOMENS 50+ ADVANTAGE) TABS Take 1 tablet by mouth daily with breakfast.    . sertraline (ZOLOFT) 100 MG tablet Take  200 mg by mouth daily.    . mometasone-formoterol (DULERA) 100-5 MCG/ACT AERO Inhale 2 puffs into the lungs 2 (two) times daily as needed for wheezing or shortness of breath.    . Tiotropium Bromide-Olodaterol (STIOLTO RESPIMAT) 2.5-2.5 MCG/ACT AERS Inhale 2 puffs into the lungs daily. 4 g 5  . LORazepam (ATIVAN) 0.5 MG tablet Take 1 tablet (0.5 mg total) by mouth every 6 (six) hours as needed for anxiety. (Patient not taking: Reported on 05/14/2019) 30 tablet 0  . nicotine (NICODERM CQ - DOSED IN MG/24 HOURS) 21 mg/24hr patch Place 1 patch (21 mg total) onto the skin daily. (Patient not taking: Reported on 05/14/2019) 28 patch 0  . thiamine 100 MG tablet Take 1 tablet (100 mg total) by mouth daily. (Patient not taking: Reported on 05/14/2019) 30 tablet 0   No facility-administered medications prior to visit.         Objective:   Physical Exam Vitals:   06/19/19 1159  BP: 128/84  Pulse: (!) 105  SpO2: 96%  Weight: 205 lb (93 kg)  Height: 5' 1.5" (1.562 m)   Gen: Pleasant, obese woman, in no distress,  normal affect  ENT: No lesions,  mouth clear,  oropharynx clear, no postnasal drip  Neck: No JVD, no stridor  Lungs: No use of accessory muscles, distant, no wheeze on forced expiration  Cardiovascular: RRR, heart sounds normal, no murmur or gallops, no peripheral edema  Musculoskeletal: No deformities, no cyanosis or clubbing  Neuro: alert, awake, non focal  Skin: Warm, no lesions or rash    Assessment & Plan:  COPD (chronic obstructive pulmonary disease) Please continue Trelegy once daily.  Rinse and gargle after using.  Keep albuterol available to use 2 puffs or one nebulizer treatment up to every 4 hours if needed for shortness of breath. You may want to try pre-treating exertion with albuterol.  Flu shot today Pneumonia shot up to date for now Follow with Dr Delton Coombes in 6 months or sooner if you have any problems  Chronic respiratory failure with hypoxia (HCC) Continue oxygen at 2L/min with exertion.   Levy Pupa, MD, PhD 06/19/2019, 12:38 PM [facility] [phone] or if no answer [phone]

## 2019-06-19 NOTE — Assessment & Plan Note (Signed)
Please continue Trelegy once daily. Rinse and gargle after using.  Keep albuterol available to use 2 puffs or one nebulizer treatment up to every 4 hours if needed for shortness of breath. You may want to try pre-treating exertion with albuterol.  Flu shot today Pneumonia shot up to date for now Follow with Dr Lamonte Sakai in 6 months or sooner if you have any problems

## 2019-06-19 NOTE — Assessment & Plan Note (Signed)
Continue oxygen at 2 L/min with exertion 

## 2019-06-19 NOTE — Progress Notes (Signed)
PFT done today. 

## 2019-06-21 ENCOUNTER — Telehealth: Payer: Self-pay | Admitting: Pulmonary Disease

## 2019-06-21 MED ORDER — TRELEGY ELLIPTA 100-62.5-25 MCG/INH IN AEPB: 1.0000 | INHALATION_SPRAY | Freq: Every day | RESPIRATORY_TRACT | 3 refills | Status: DC

## 2019-06-21 MED ORDER — ALBUTEROL SULFATE HFA 108 (90 BASE) MCG/ACT IN AERS: 2.0000 | INHALATION_SPRAY | RESPIRATORY_TRACT | 0 refills | Status: AC | PRN

## 2019-06-21 NOTE — Telephone Encounter (Signed)
Called and spoke w/ pt. Pt states she never received her refills for her albuterol rescue inhaler and Trelegy maintenance inhaler. She states she only has three days left of Trelegy and needs another rescue inhaler to have on hand. Pt last seen 06/19/2019 by RB. Office notes from this visit instruct pt to continue taking Trelegy daily and albuterol PRN. I let pt know I would send the order for refills on albuterol and Trelegy to her preferred pharmacy listed below. Pt verbalized understanding with no additional questions or concerns. Nothing further needed at this time.

## 2019-07-02 ENCOUNTER — Other Ambulatory Visit: Payer: Self-pay | Admitting: Gastroenterology

## 2019-07-05 ENCOUNTER — Encounter: Payer: Self-pay | Admitting: Cardiology

## 2019-07-05 DIAGNOSIS — I5032 Chronic diastolic (congestive) heart failure: Secondary | ICD-10-CM | POA: Insufficient documentation

## 2019-07-05 NOTE — Progress Notes (Deleted)
Cardiology Office Note   Date:  07/05/2019   ID:  Airyanna, Dipalma 11/23/1950, MRN [id number]  PCP:  Kaleen Mask, MD  Cardiologist:   No primary care provider on file. Referring:  ***  No chief complaint on file.     History of Present Illness: [name] [name] is a 68 y.o. female who was referred by *** for evaluation of dyspnea.  ***  Past Medical History:  Diagnosis Date  . Anemia    hx of-yrs ago  . Anxiety   . Arthritis   . Asthma    uses Albuterol inhaler as needed  . Cirrhosis (HCC)   . COPD (chronic obstructive pulmonary disease) (HCC)   . Depression    takes Viibryd daily  . Diastolic congestive heart failure (HCC)   . ETOH abuse   . Family history of anesthesia complication    sister gets sick with anesthesia  . Headache(784.0)    occasionally  . HTN (hypertension)    takes Lisinopril daily  . Joint pain   . Joint swelling   . Nocturia   . Pneumonia    about 2 yrs ago  . Respiratory failure with hypoxia (HCC)   . Systemic inflammatory response syndrome (HCC)   . Urinary frequency   . Weakness    numbness and tingling right hand    Past Surgical History:  Procedure Laterality Date  . cataract surgery Bilateral   . CESAREAN SECTION    . CHOLECYSTECTOMY    . CYSTECTOMY     both ears  . DILATION AND CURETTAGE OF UTERUS    . REVERSE SHOULDER ARTHROPLASTY Right 11/08/2013   Procedure: RIGHT REVERSE SHOULDER ARTHROPLASTY;  Surgeon: Senaida Lange, MD;  Location: [facility] OR;  Service: Orthopedics;  Laterality: Right;  . TOTAL ABDOMINAL HYSTERECTOMY    . TUBAL LIGATION       Current Outpatient Medications  Medication Sig Dispense Refill  . albuterol (PROAIR HFA) 108 (90 Base) MCG/ACT inhaler Inhale 2 puffs into the lungs every 4 (four) hours as needed for wheezing or shortness of breath. 6.7 g 0  . Aspirin-Salicylamide-Caffeine (BC HEADACHE POWDER PO) Take 1 packet by mouth as needed (for headaches).    . Dextran 70-Hypromellose, PF,  (ARTIFICIAL TEARS PF) 0.1-0.3 % SOLN Place 1-2 drops into both eyes 3 (three) times daily as needed (for dryness).    . Fluticasone-Umeclidin-Vilant (TRELEGY ELLIPTA) 100-62.5-25 MCG/INH AEPB Inhale 1 puff into the lungs daily. 28 each 3  . folic acid (FOLVITE) 1 MG tablet Take 1 tablet (1 mg total) by mouth daily. 30 tablet 0  . GLUCOSAMINE-CHONDROITIN PO Take 2 tablets by mouth daily with breakfast.    . ipratropium-albuterol (DUONEB) 0.5-2.5 (3) MG/3ML SOLN Take 3 mLs by nebulization every 6 (six) hours.    Marland Kitchen lactulose (CHRONULAC) 10 GM/15ML solution Take 15 mLs (10 g total) by mouth 3 (three) times daily. 236 mL 0  . Multiple Vitamins-Minerals (ONE-A-DAY WOMENS 50+ ADVANTAGE) TABS Take 1 tablet by mouth daily with breakfast.    . sertraline (ZOLOFT) 100 MG tablet Take 200 mg by mouth daily.     No current facility-administered medications for this visit.     Allergies:   Penicillins, Adhesive [tape], and Codeine    Social History:  The patient  reports that she has been smoking cigarettes. She has a 86.00 pack-year smoking history. She has never used smokeless tobacco. She reports current alcohol use. She reports that she does not use drugs.  Family History:  The patient's ***family history includes Alcoholism in her father; Asthma in her father; Diabetes in her father; Heart disease in her father; Lung cancer in her father and mother.    ROS:  Please see the history of present illness.   Otherwise, review of systems are positive for {NONE DEFAULTED:18576::"none"}.   All other systems are reviewed and negative.    PHYSICAL EXAM: VS:  There were no vitals taken for this visit. , BMI There is no height or weight on file to calculate BMI. GENERAL:  Well appearing HEENT:  Pupils equal round and reactive, fundi not visualized, oral mucosa unremarkable NECK:  No jugular venous distention, waveform within normal limits, carotid upstroke brisk and symmetric, no bruits, no thyromegaly  LYMPHATICS:  No cervical, inguinal adenopathy LUNGS:  Clear to auscultation bilaterally BACK:  No CVA tenderness CHEST:  Unremarkable HEART:  PMI not displaced or sustained,S1 and S2 within normal limits, no S3, no S4, no clicks, no rubs, *** murmurs ABD:  Flat, positive bowel sounds normal in frequency in pitch, no bruits, no rebound, no guarding, no midline pulsatile mass, no hepatomegaly, no splenomegaly EXT:  2 plus pulses throughout, no edema, no cyanosis no clubbing SKIN:  No rashes no nodules NEURO:  Cranial nerves II through XII grossly intact, motor grossly intact throughout PSYCH:  Cognitively intact, oriented to person place and time    EKG:  EKG {ACTION; IS/IS JSE:[id number]} ordered today. The ekg ordered today demonstrates ***   Recent Labs: 03/06/2019: BUN 7; Creatinine, Ser 0.82; Potassium 3.9; Sodium 136 05/14/2019: ALT 48; Hemoglobin 14.4; Platelets 247.0; Pro B Natriuretic peptide (BNP) 52.0    Lipid Panel    Component Value Date/Time   CHOL  06/17/2010 0330    197        ATP III CLASSIFICATION:  <200     mg/dL   Desirable  200-239  mg/dL   Borderline High  >=240    mg/dL   High          TRIG 146 06/17/2010 0330   HDL 42 06/17/2010 0330   CHOLHDL 4.7 06/17/2010 0330   VLDL 29 06/17/2010 0330   LDLCALC (H) 06/17/2010 0330    126        Total Cholesterol/HDL:CHD Risk Coronary Heart Disease Risk Table                     Men   Women  1/2 Average Risk   3.4   3.3  Average Risk       5.0   4.4  2 X Average Risk   9.6   7.1  3 X Average Risk  23.4   11.0        Use the calculated Patient Ratio above and the CHD Risk Table to determine the patient's CHD Risk.        ATP III CLASSIFICATION (LDL):  <100     mg/dL   Optimal  100-129  mg/dL   Near or Above                    Optimal  130-159  mg/dL   Borderline  160-189  mg/dL   High  >190     mg/dL   Very High      Wt Readings from Last 3 Encounters:  06/19/19 205 lb (93 kg)  05/30/19 208 lb 12.8 oz  (94.7 kg)  05/14/19 214 lb (97.1 kg)      Other  studies Reviewed: Additional studies/ records that were reviewed today include: ***. Review of the above records demonstrates:  Please see elsewhere in the note.  ***   ASSESSMENT AND PLAN:  DYSPNEA:  ***  CHRONIC DIASTOLIC HF:  ***   HYPERTENSION:  ***   Current medicines are reviewed at length with the patient today.  The patient {ACTIONS; HAS/DOES NOT HAVE:19233} concerns regarding medicines.  The following changes have been made:  {PLAN; NO CHANGE:13088:s}  Labs/ tests ordered today include: *** No orders of the defined types were placed in this encounter.    Disposition:   FU with ***    Signed, Rollene RotundaJames Latajah Thuman, MD  07/05/2019 1:20 PM    [place]

## 2019-07-06 ENCOUNTER — Ambulatory Visit: Payer: Medicare Other | Admitting: Cardiology

## 2019-07-23 ENCOUNTER — Other Ambulatory Visit: Payer: Medicare Other

## 2019-07-27 ENCOUNTER — Other Ambulatory Visit (HOSPITAL_COMMUNITY)
Admission: RE | Admit: 2019-07-27 | Discharge: 2019-07-27 | Disposition: A | Discharge status: Home / Self Care | Payer: Medicare Other | Source: Ambulatory Visit | Attending: Gastroenterology | Admitting: Gastroenterology

## 2019-07-27 DIAGNOSIS — Z20828 Contact with and (suspected) exposure to other viral communicable diseases: Secondary | ICD-10-CM | POA: Insufficient documentation

## 2019-07-27 DIAGNOSIS — Z01812 Encounter for preprocedural laboratory examination: Secondary | ICD-10-CM | POA: Insufficient documentation

## 2019-07-28 LAB — NOVEL CORONAVIRUS, NAA (HOSP ORDER, SEND-OUT TO REF LAB; TAT 18-24 HRS): SARS-CoV-2, NAA: NOT DETECTED

## 2019-07-30 ENCOUNTER — Other Ambulatory Visit: Payer: Self-pay

## 2019-07-30 ENCOUNTER — Encounter (HOSPITAL_COMMUNITY): Payer: Self-pay | Admitting: *Deleted

## 2019-07-31 ENCOUNTER — Ambulatory Visit (HOSPITAL_COMMUNITY): Payer: Medicare Other | Admitting: Anesthesiology

## 2019-07-31 ENCOUNTER — Encounter (HOSPITAL_COMMUNITY): Payer: Self-pay | Admitting: Anesthesiology

## 2019-07-31 ENCOUNTER — Ambulatory Visit (HOSPITAL_COMMUNITY)
Admission: RE | Admit: 2019-07-31 | Discharge: 2019-07-31 | Disposition: A | Discharge status: Home / Self Care | Payer: Medicare Other | Attending: Gastroenterology | Admitting: Gastroenterology

## 2019-07-31 ENCOUNTER — Encounter (HOSPITAL_COMMUNITY)
Admission: RE | Disposition: A | Discharge status: Home / Self Care | Payer: Self-pay | Source: Home / Self Care | Attending: Gastroenterology

## 2019-07-31 DIAGNOSIS — I110 Hypertensive heart disease with heart failure: Secondary | ICD-10-CM | POA: Insufficient documentation

## 2019-07-31 DIAGNOSIS — K219 Gastro-esophageal reflux disease without esophagitis: Secondary | ICD-10-CM | POA: Insufficient documentation

## 2019-07-31 DIAGNOSIS — I509 Heart failure, unspecified: Secondary | ICD-10-CM | POA: Insufficient documentation

## 2019-07-31 DIAGNOSIS — B9681 Helicobacter pylori [H. pylori] as the cause of diseases classified elsewhere: Secondary | ICD-10-CM | POA: Insufficient documentation

## 2019-07-31 DIAGNOSIS — K2950 Unspecified chronic gastritis without bleeding: Secondary | ICD-10-CM | POA: Diagnosis not present

## 2019-07-31 DIAGNOSIS — K2980 Duodenitis without bleeding: Secondary | ICD-10-CM | POA: Diagnosis not present

## 2019-07-31 DIAGNOSIS — Z6838 Body mass index (BMI) 38.0-38.9, adult: Secondary | ICD-10-CM | POA: Insufficient documentation

## 2019-07-31 DIAGNOSIS — E669 Obesity, unspecified: Secondary | ICD-10-CM | POA: Insufficient documentation

## 2019-07-31 DIAGNOSIS — F17210 Nicotine dependence, cigarettes, uncomplicated: Secondary | ICD-10-CM | POA: Diagnosis not present

## 2019-07-31 DIAGNOSIS — J449 Chronic obstructive pulmonary disease, unspecified: Secondary | ICD-10-CM | POA: Insufficient documentation

## 2019-07-31 DIAGNOSIS — K7030 Alcoholic cirrhosis of liver without ascites: Secondary | ICD-10-CM | POA: Insufficient documentation

## 2019-07-31 DIAGNOSIS — K449 Diaphragmatic hernia without obstruction or gangrene: Secondary | ICD-10-CM | POA: Insufficient documentation

## 2019-07-31 HISTORY — PX: BIOPSY: SHX5522

## 2019-07-31 HISTORY — PX: ESOPHAGOGASTRODUODENOSCOPY (EGD) WITH PROPOFOL: SHX5813

## 2019-07-31 SURGERY — ESOPHAGOGASTRODUODENOSCOPY (EGD) WITH PROPOFOL
Anesthesia: Monitor Anesthesia Care

## 2019-07-31 MED ORDER — ONDANSETRON HCL 4 MG/2ML IJ SOLN
INTRAMUSCULAR | Status: DC | PRN
  Administered 2019-07-31: 4 mg via INTRAVENOUS

## 2019-07-31 MED ORDER — PROPOFOL 500 MG/50ML IV EMUL
INTRAVENOUS | Status: AC
  Filled 2019-07-31: qty 100

## 2019-07-31 MED ORDER — PROPOFOL 500 MG/50ML IV EMUL
INTRAVENOUS | Status: DC | PRN
  Administered 2019-07-31: 125 ug/kg/min via INTRAVENOUS

## 2019-07-31 MED ORDER — LACTATED RINGERS IV SOLN
INTRAVENOUS | Status: DC
  Administered 2019-07-31: 1000 mL via INTRAVENOUS

## 2019-07-31 MED ORDER — SODIUM CHLORIDE 0.9 % IV SOLN: INTRAVENOUS | Status: DC

## 2019-07-31 MED ORDER — PROPOFOL 500 MG/50ML IV EMUL
INTRAVENOUS | Status: DC | PRN
  Administered 2019-07-31: 30 mg via INTRAVENOUS
  Administered 2019-07-31: 20 mg via INTRAVENOUS

## 2019-07-31 SURGICAL SUPPLY — 14 items

## 2019-07-31 NOTE — Transfer of Care (Signed)
Immediate Anesthesia Transfer of Care Note  Patient: [name] [name]  Procedure(s) Performed: Procedure(s): ESOPHAGOGASTRODUODENOSCOPY (EGD) WITH PROPOFOL (N/A) BIOPSY  Patient Location: PACU  Anesthesia Type:MAC  Level of Consciousness:  sedated, patient cooperative and responds to stimulation  Airway & Oxygen Therapy:Patient Spontanous Breathing and Patient connected to face mask oxgen  Post-op Assessment:  Report given to PACU RN and Post -op Vital signs reviewed and stable  Post vital signs:  Reviewed and stable  Last Vitals:  Vitals:   07/31/19 0736  BP: 126/64  Pulse: (!) 103  Resp: (!) 22  Temp: 36.9 C  SpO2: 98%    Complications: No apparent anesthesia complications

## 2019-07-31 NOTE — Discharge Instructions (Signed)

## 2019-07-31 NOTE — H&P (Signed)
[name] [name] is a 68 y.o. female has presented to hospital for EGD with possible band ligation. Patient with past medical history of alcoholic cirrhosis.  No previous EGD.  She continues to drink alcohol.  Denies any active GI bleed.  Acid reflux is well controlled with Protonix.  Undergoing EGD for screening of varices and possible band ligation.   The various methods of treatment have been discussed with the patient and family. After consideration of risks, benefits and other options for treatment, the patient has consented to  Procedure(s): EGD with possible band ligation as a surgical intervention .  The patient's history has been reviewed, patient examined, no change in status, stable for surgery.  I have reviewed the patient's chart and labs.  Questions were answered to the patient's satisfaction.   Risks (bleeding, infection, bowel perforation that could require surgery, sedation-related changes in cardiopulmonary systems), benefits (identification and possible treatment of source of symptoms, exclusion of certain causes of symptoms), and alternatives (watchful waiting, radiographic imaging studies, empiric medical treatment)  were explained to patient in detail and patient wishes to proceed.  Otis Brace MD, [place] 07/31/2019, 7:49 AM  Contact #  [phone]

## 2019-07-31 NOTE — Op Note (Signed)
[facility] Patient Name: [name] [name] Procedure Date: 07/31/2019 MRN: [id number] Attending MD: Kathi Der , MD Date of Birth: Apr 12, 1951 CSN: [id number] Age: 68 Admit Type: Outpatient Procedure:                Upper GI endoscopy Indications:              Heartburn, Cirrhosis rule out esophageal varices Providers:                Kathi Der, MD, Dwain Sarna, RN, Marc Morgans, Technician Referring MD:              Medicines:                Sedation Administered by an Anesthesia Professional Complications:            No immediate complications. Estimated Blood Loss:     Estimated blood loss was minimal. Procedure:                Pre-Anesthesia Assessment:                           - Prior to the procedure, a History and Physical                            was performed, and patient medications and                            allergies were reviewed. The patient's tolerance of                            previous anesthesia was also reviewed. The risks                            and benefits of the procedure and the sedation                            options and risks were discussed with the patient.                            All questions were answered, and informed consent                            was obtained. Prior Anticoagulants: The patient has                            taken no previous anticoagulant or antiplatelet                            agents. ASA Grade Assessment: III - A patient with                            severe systemic disease. After reviewing the risks  and benefits, the patient was deemed in                            satisfactory condition to undergo the procedure.                           After obtaining informed consent, the endoscope was                            passed under direct vision. Throughout the                            procedure, the patient's blood pressure,  pulse, and                            oxygen saturations were monitored continuously. The                            GIF-H190 ([id number]) Olympus gastroscope was                            introduced through the mouth, and advanced to the                            second part of duodenum. The upper GI endoscopy was                            technically difficult and complex due to cough. The                            patient tolerated the procedure well. Scope In: Scope Out: Findings:      The Z-line was regular and was found 36 cm from the incisors.      There is no endoscopic evidence of varices in the entire esophagus.      A small hiatal hernia was present.      Diffuse mild inflammation characterized by congestion (edema) and       erythema was found in the entire examined stomach. Biopsies were taken       with a cold forceps for histology.      The cardia and gastric fundus were normal on retroflexion.      A small amount of food (residue) was found in the prepyloric region of       the stomach.      Scattered mild mucosal changes characterized by congestion and altered       texture and thick folds were found in the second portion of the       duodenum. Biopsies were taken with a cold forceps for histology.       Visualization of duodenal bulb was difficult because of constant cough. Impression:               - Z-line regular, 36 cm from the incisors.                           - Small hiatal hernia.                           -  Gastritis. Biopsied.                           - A small amount of food (residue) in the stomach.                           - Mucosal changes in the duodenum. Biopsied. Moderate Sedation:      Moderate (conscious) sedation was personally administered by an       anesthesia professional. The following parameters were monitored: oxygen       saturation, heart rate, blood pressure, and response to care. Recommendation:           - Patient has a contact  number available for                            emergencies. The signs and symptoms of potential                            delayed complications were discussed with the                            patient. Return to normal activities tomorrow.                            Written discharge instructions were provided to the                            patient.                           - Resume previous diet.                           - Continue present medications.                           - Await pathology results.                           - Return to my office as previously scheduled. Procedure Code(s):        --- Professional ---                           [phone], Esophagogastroduodenoscopy, flexible,                            transoral; with biopsy, single or multiple Diagnosis Code(s):        --- Professional ---                           K44.9, Diaphragmatic hernia without obstruction or                            gangrene                           K29.70, Gastritis, unspecified, without bleeding  K31.89, Other diseases of stomach and duodenum                           R12, Heartburn                           K74.60, Unspecified cirrhosis of liver CPT copyright 2019 American Medical Association. All rights reserved. The codes documented in this report are preliminary and upon coder review may  be revised to meet current compliance requirements. Kathi DerParag Skila Rollins, MD Kathi DerParag Brittiny Levitz, MD 07/31/2019 8:33:29 AM Number of Addenda: 0

## 2019-07-31 NOTE — Anesthesia Postprocedure Evaluation (Signed)
Anesthesia Post Note  Patient: [name] [name]  Procedure(s) Performed: ESOPHAGOGASTRODUODENOSCOPY (EGD) WITH PROPOFOL (N/A ) BIOPSY     Patient location during evaluation: Phase II Anesthesia Type: MAC Level of consciousness: awake Pain management: pain level controlled Vital Signs Assessment: post-procedure vital signs reviewed and stable Respiratory status: spontaneous breathing Cardiovascular status: stable Postop Assessment: no apparent nausea or vomiting Anesthetic complications: no    Last Vitals:  Vitals:   07/31/19 0831 07/31/19 0841  BP: (!) 106/57 (!) 117/57  Pulse: 96 93  Resp: (!) 23 (!) 25  Temp: 36.8 C   SpO2: 97% 98%    Last Pain:  Vitals:   07/31/19 0841  TempSrc:   PainSc: 0-No pain   Pain Goal:                   Huston Foley

## 2019-07-31 NOTE — Anesthesia Preprocedure Evaluation (Signed)
Anesthesia Evaluation  Patient identified by MRN, date of birth, ID band Patient awake    Reviewed: Allergy & Precautions, H&P , NPO status , Patient's Chart, lab work & pertinent test results  Airway Mallampati: II   Neck ROM: full    Dental  (+) Teeth Intact   Pulmonary asthma , COPD, Current Smoker,    Pulmonary exam normal breath sounds clear to auscultation       Cardiovascular hypertension, Pt. on medications +CHF  Normal cardiovascular exam Rhythm:Regular Rate:Normal     Neuro/Psych  Headaches, Anxiety Depression    GI/Hepatic (+) Cirrhosis     substance abuse  alcohol use,   Endo/Other  obese  Renal/GU      Musculoskeletal  (+) Arthritis ,   Abdominal (+) + obese,   Peds  Hematology   Anesthesia Other Findings   Reproductive/Obstetrics                             Anesthesia Physical  Anesthesia Plan  ASA: III  Anesthesia Plan: MAC   Post-op Pain Management:    Induction:   PONV Risk Score and Plan:   Airway Management Planned: Mask  Additional Equipment: None  Intra-op Plan:   Post-operative Plan:   Informed Consent: I have reviewed the patients History and Physical, chart, labs and discussed the procedure including the risks, benefits and alternatives for the proposed anesthesia with the patient or authorized representative who has indicated his/her understanding and acceptance.       Plan Discussed with: CRNA  Anesthesia Plan Comments:         Anesthesia Quick Evaluation

## 2019-08-01 ENCOUNTER — Encounter (HOSPITAL_COMMUNITY): Payer: Self-pay | Admitting: Gastroenterology

## 2019-08-02 LAB — SURGICAL PATHOLOGY

## 2019-08-15 ENCOUNTER — Ambulatory Visit: Payer: Medicare Other | Admitting: Cardiology

## 2019-09-04 ENCOUNTER — Ambulatory Visit: Payer: Medicare Other | Admitting: Cardiology

## 2019-09-05 ENCOUNTER — Encounter: Payer: Self-pay | Admitting: General Practice

## 2019-09-26 ENCOUNTER — Other Ambulatory Visit: Payer: Medicare Other

## 2019-12-23 ENCOUNTER — Other Ambulatory Visit: Payer: Self-pay | Admitting: Emergency Medicine

## 2020-01-23 ENCOUNTER — Other Ambulatory Visit: Payer: Self-pay | Admitting: Gastroenterology

## 2020-01-23 DIAGNOSIS — K7460 Unspecified cirrhosis of liver: Secondary | ICD-10-CM

## 2020-01-24 ENCOUNTER — Ambulatory Visit
Admission: RE | Admit: 2020-01-24 | Discharge: 2020-01-24 | Disposition: A | Discharge status: Home / Self Care | Payer: Medicare Other | Source: Ambulatory Visit | Attending: Gastroenterology | Admitting: Gastroenterology

## 2020-01-24 DIAGNOSIS — K7460 Unspecified cirrhosis of liver: Secondary | ICD-10-CM

## 2020-01-25 ENCOUNTER — Ambulatory Visit: Payer: Self-pay | Admitting: Neurology

## 2020-01-31 ENCOUNTER — Other Ambulatory Visit: Payer: Self-pay | Admitting: Gastroenterology

## 2020-01-31 DIAGNOSIS — R9389 Abnormal findings on diagnostic imaging of other specified body structures: Secondary | ICD-10-CM

## 2020-01-31 DIAGNOSIS — K769 Liver disease, unspecified: Secondary | ICD-10-CM

## 2020-02-17 ENCOUNTER — Ambulatory Visit
Admission: RE | Admit: 2020-02-17 | Discharge: 2020-02-17 | Disposition: A | Discharge status: Home / Self Care | Payer: Medicare Other | Source: Ambulatory Visit | Attending: Gastroenterology | Admitting: Gastroenterology

## 2020-02-17 DIAGNOSIS — R9389 Abnormal findings on diagnostic imaging of other specified body structures: Secondary | ICD-10-CM

## 2020-02-17 DIAGNOSIS — K769 Liver disease, unspecified: Secondary | ICD-10-CM

## 2020-02-17 MED ORDER — GADOBENATE DIMEGLUMINE 529 MG/ML IV SOLN
15.0000 mL | Freq: Once | INTRAVENOUS | Status: AC | PRN
  Administered 2020-02-17: 15 mL via INTRAVENOUS

## 2020-02-19 ENCOUNTER — Other Ambulatory Visit: Payer: Self-pay | Admitting: *Deleted

## 2020-02-19 MED ORDER — TRELEGY ELLIPTA 100-62.5-25 MCG/INH IN AEPB: 1.0000 | INHALATION_SPRAY | Freq: Every day | RESPIRATORY_TRACT | 0 refills | Status: AC

## 2020-03-05 ENCOUNTER — Ambulatory Visit: Payer: Self-pay | Admitting: Neurology

## 2020-03-25 ENCOUNTER — Other Ambulatory Visit: Payer: Self-pay

## 2020-03-25 ENCOUNTER — Encounter: Payer: Self-pay | Admitting: Neurology

## 2020-03-25 ENCOUNTER — Encounter (HOSPITAL_COMMUNITY): Payer: Self-pay | Admitting: Gastroenterology

## 2020-03-25 ENCOUNTER — Ambulatory Visit: Payer: Medicare Other | Admitting: Neurology

## 2020-03-25 VITALS — BP 136/81 | HR 95 | Ht 61.5 in | Wt 182.0 lb

## 2020-03-25 DIAGNOSIS — F17209 Nicotine dependence, unspecified, with unspecified nicotine-induced disorders: Secondary | ICD-10-CM

## 2020-03-25 DIAGNOSIS — F1020 Alcohol dependence, uncomplicated: Secondary | ICD-10-CM

## 2020-03-25 DIAGNOSIS — J449 Chronic obstructive pulmonary disease, unspecified: Secondary | ICD-10-CM

## 2020-03-25 DIAGNOSIS — R64 Cachexia: Secondary | ICD-10-CM | POA: Diagnosis not present

## 2020-03-25 NOTE — Patient Instructions (Signed)

## 2020-03-25 NOTE — Progress Notes (Addendum)
SLEEP MEDICINE CLINIC    Provider:  Melvyn [name]  [name] Lenz, MD  Primary Care Physician:  [name] [name], [name] Oliver, MD [address] [place] [id number]     Referring Provider: Kaleen [name], [name] Oliver, Md [address] [place] [id number]     She is followed by [name] [name] Staggersombauer, MD for GI- [name]. Antoine [name] for cardiology, and reports she is followed by [facility] pul[facility].  There are no other notes in EPIC supporting this.        Chief Complaint according to patient   Patient presents with:    . New Patient (Initial Visit)      this patient presents with limited mobility, oxygen dependence, poor sleep, and trembling. pt states that she has developed weakness in bilateral lower ext as well as shaking all over that seems to be progressing. She has had 4 falls "recently". She reportedly lost 38 pounds in 10 weeks.       HISTORY OF PRESENT ILLNESS:  [name] NumbersSylvia [name] is a 69 y.o. year old  Caucasian female patient seen here upon a referral on 03/25/2020 from [name] [name].    Chief concern according to patient : " I have COPD and Asthma and still smoke". " I was referred to lung and heart MD and didn't make the appointments" .   I have the pleasure of seeing [name] [name] today, a right-handed White or Caucasian female with a possible sleep disorder. She  has a past medical history of Alcoholic (HCC), Anxiety, COPD (chronic obstructive pulmonary disease) (HCC), Depression, Hard of hearing, Hypertension, and Oxygen dependent.. She wears a nasal canula and used an oxygen concentrator during our visit. She apears cachectic.  She has not made cardio or pulmonology appointments but is here for her weakness.     Social history:  Patient is retired from DealerCNA/ homesitter and lives in a household  alone. Family status is widowed, she has one child.   The patient currently is disabled for over 5 years- . Tobacco use- ongoing .  ETOH use - in the past, abused alcohol, Caffeine intake in form of  Coffee( none ) Soda( none) Tea ( 1 glass a day) , no  energy drinks. She lives "on Ensure "   She has relayed on a walker for over 2 years but now falls frequently, her legs "give out".    Review of Systems: Out of a complete 14 system review, the patient complains of only the following symptoms, and all other reviewed systems are negative.:    oxygen dependent recovering alcoholic, tobacco abuser, active .  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   Social History   Socioeconomic History  . Marital status: Widowed    Spouse name: Not on file  . Number of children: Not on file  . Years of education: Not on file  . Highest education level: Not on file  Occupational History  . Not on file  Tobacco Use  . Smoking status: Current Every Day Smoker    Types: Cigarettes  . Smokeless tobacco: Never Used  Substance and Sexual Activity  . Alcohol use: Yes    Alcohol/week: 12.0 standard drinks    Types: 12 Cans of beer per week  . Drug use: Not Currently  . Sexual activity: Not on file  Other Topics Concern  . Not on file  Social History Narrative  . Not on file   Social Determinants of Health   Financial  Resource Strain:   . Difficulty of Paying Living Expenses:   Food Insecurity:   . Worried About Programme researcher, broadcasting/film/video in the Last Year:   . Barista in the Last Year:   Transportation Needs:   . Freight forwarder (Medical):   Marland Kitchen Lack of Transportation (Non-Medical):   Physical Activity:   . Days of Exercise per Week:   . Minutes of Exercise per Session:   Stress:   . Feeling of Stress :   Social Connections:   . Frequency of Communication with Friends and Family:   . Frequency of Social Gatherings with Friends and Family:   . Attends Religious Services:   . Active Member of Clubs or Organizations:   . Attends Banker Meetings:   Marland Kitchen Marital Status:     No family history on file.  Past Medical History:   Diagnosis Date  . Alcoholic (HCC)   . Anxiety   . COPD (chronic obstructive pulmonary disease) (HCC)   . Depression   . Hard of hearing   . Hypertension   . Oxygen dependent     NO history on FILE - No information in EPIC    Current Outpatient Medications on File Prior to Visit  Medication Sig Dispense Refill  . Albuterol Sulfate (PROAIR RESPICLICK) 108 (90 Base) MCG/ACT AEPB Inhale 1-2 puffs into the lungs every 4 (four) hours as needed.    Marland Kitchen amLODipine (NORVASC) 5 MG tablet Take 5 mg by mouth daily.    Marland Kitchen buPROPion (WELLBUTRIN XL) 300 MG 24 hr tablet Take 300 mg by mouth daily.    . Cholecalciferol (VITAMIN D3) 50 MCG (2000 UT) TABS Take 1 tablet by mouth daily.    . Fluticasone-Umeclidin-Vilant (TRELEGY ELLIPTA) 100-62.5-25 MCG/INH AEPB Inhale into the lungs.    . folic acid (FOLVITE) 1 MG tablet Take 1,000 mcg by mouth daily.    . Glucosamine-Chondroitin (GLUCOSAMINE CHONDR COMPLEX PO) Take by mouth.    . lactulose (CHRONULAC) 10 GM/15ML solution Take by mouth 2 (two) times daily.    . Multiple Vitamins-Minerals (CENTRUM PO) Take 1 tablet by mouth daily.    . naltrexone (DEPADE) 50 MG tablet Take 50 mg by mouth daily.    . OXYGEN Inhale into the lungs. 2 LITER    . sertraline (ZOLOFT) 100 MG tablet Take 100 mg by mouth daily.     No current facility-administered medications on file prior to visit.    Allergies  Allergen Reactions  . Codeine Hives  . Tape Hives    Paper tape    Physical exam:  Today's Vitals   03/25/20 0933  BP: 136/81  Pulse: 95  Weight: 182 lb (82.6 kg)  Height: 5' 1.5" (1.562 m)   Body mass index is 33.83 kg/m.   Wt Readings from Last 3 Encounters:  03/25/20 182 lb (82.6 kg)     Ht Readings from Last 3 Encounters:  03/25/20 5' 1.5" (1.562 m)      General: The patient is awake, alert and appears  in  distress. The patient is groomed. Head: Normocephalic, atraumatic. Neck is supple. Cardiovascular:  Regular rate and cardiac rhythm by  pulse,  without distended neck veins. Respiratory: Lungs are wheezing.   Skin:  With petechiae- no evidence of ankle edema, but bluish discoloration.  Palmar erythema, facial spider navi.  Trunk: The patient's posture is seated and erect.    Neurologic exam : The patient is awake and alert, oriented to place and  time.   Memory subjective described as intact.  Attention span & concentration ability appears normal.  Speech is fluent,  without  dysarthria, dysphonia or aphasia.  Mood and affect are appropriate.   Cranial nerves: no loss of smell or taste reported  Pupils are equal and briskly reactive to light. Funduscopic exam deferred.  Extraocular movements in vertical and horizontal planes were intact and with endpoint( physiological ) nystagmus. normal accomodation, status post cataract. Reports blurry vision, sometimes Diplopia. Visual fields by finger perimetry are intact. Hearing was impaired   Facial sensation intact to fine touch.  Facial motor strength is symmetric and tongue and uvula move midline.  Neck ROM : rotation, tilt and flexion extension were normal for age and shoulder shrug was symmetrical.    Motor exam:  Symmetric bulk, elevated tone with cogwheeling and restricted ROM.   Normal tone without cog wheeling, symmetrically weakened grip strength . Thenar eminence atrophy    Sensory:  Fine touch, pinprick were normal above the ankles, Vibration sense is lost to midcalf- level.  Proprioception tested in the upper extremities was abnormal   Coordination: Rapid alternating movements in the fingers/hands were of reduced  speed.  There is significant tremor at rest and with action.  The Finger-to-nose maneuver was affected by ataxia, dysmetria and tremor.   Gait and station: Patient could not rise unassisted from a seated position, walked with assistive device- walker .  Toe and heel walk were deferred.  Deep tendon reflexes: in the  upper and lower extremities are  symmetric, rather brisk(!) and intact.  Babinski response was normal       There is literally no epic system accessible past medical history or treatment history available for this patient.  Her current medications were entered by my nurse but I do not know how long she may have been on which of the medications.  Very interesting is that she is oxygen concentrator dependent and walker dependent probably for over 5 years now.  She is on naltrexone she continues to smoke she continues occasionally to drink.  She uses albuterol inhaler and a trilogy-fluticasone-meclizine-violent.  She takes multiple minerals including extra doses of folic acid and she is on Zoloft and Wellbutrin as well as Norvasc.  She can transfer from a seated to a standing position but strength is overall seems to be limited, she has an elevated muscle tone with cogwheeling and there is a strong underlying tremor.  Her legs are buckling when she stands longer than 2 or 3 minutes.  Normal apparently she has fallen in November and in February and each time she lost balance when she turned quickly.  She has significant tremor in both hands there is more tremor irregularity in the right and left and her tremor is also exacerbated when she takes albuterol which is expected.  Her intake form there is witness to her tremor.  This is a patient with respiratory-metabolic cachexia, muscle mass loss, normal cerebellar tremor and mobility issues at goal in line with her current state of overall weakness.  I understand that the seated walker is considered no longer enough for her because she may fall onto it, and she could qualify for a wheelchair.  So for this I will refer her to neuro rehab to be evaluated and measured for device.  This is not done in the sleep clinic.  I will have recommendations from a neuro rehab specialist or if PMNR specialist to get the best ambulatory assistance for her.Marland Kitchen  After spending a total time of 60 minutes face to  face and additional time for physical and neurologic examination, research for any  laboratory studies, EKG reports, pulmonary FT.  Personal review of  reports and results - review of referral information / records as far as provided in visit, I have established the following assessments:  1) oxygen dependent recovering alcoholic, tobacco abuser, active . 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   Long standing balance issues , now exacerbated by weakness, as expected in cachexia..     My Plan is to proceed with:  1)This is a patient with respiratory-cardiac cachexia, muscle mass loss, normal cerebellar tremor and mobility issues at goal in line with her current state of overall weakness.  I understand that the seated walker is considered no longer enough for her because she may fall onto it, and she could qualify for a wheelchair.  So for this I will refer her to neuro rehab to be evaluated and measured for device.  This is not done in the sleep clinic.  I will have recommendations from a neuro rehab specialist or if PMNR specialist to get the best ambulatory assistance for her..    I would like to thank [name] Mask, MD and his NP  for allowing me to meet with  this pleasant patient.  Her cardiological and pulmonary care appears to be the most urgent   In short, [name] [name] is presenting with COPD cachexia ibuprofen will refer to physical rehabilitation to see what assistive device can help her reducing her fall risk. I recommend to follow up with PM and R and possibly pulmonary rehab  .  CC: I will share my notes with PCP.   Electronically signed by: [name] Novas, MD 03/25/2020 9:53 AM  [facility] Board certified by The [employer] of Sleep Medicine and Diplomate of the [employer] of Sleep Medicine. Board certified In Neurology through the ABPN, Fellow of the [employer] of  Neurology. Medical Director of [facility].

## 2020-03-25 NOTE — Addendum Note (Signed)
Addended by: Melvyn Novas on: 03/25/2020 10:29 AM   Modules accepted: Level of Service

## 2020-04-02 ENCOUNTER — Other Ambulatory Visit: Payer: Self-pay | Admitting: Emergency Medicine

## 2020-04-02 ENCOUNTER — Encounter: Payer: Self-pay | Admitting: Physical Medicine and Rehabilitation

## 2020-05-02 ENCOUNTER — Encounter: Payer: Medicare Other | Admitting: Physical Medicine and Rehabilitation

## 2020-05-09 ENCOUNTER — Telehealth: Payer: Self-pay | Admitting: Neurology

## 2020-05-09 NOTE — Telephone Encounter (Signed)
[name] [name] @ [facility] is asking to be called with the results of the mobility exam paperwork.  [name] [name] can be reached at [phone] FBP#[id number] a fax can be sent to [phone]

## 2020-05-12 NOTE — Telephone Encounter (Signed)
**  If they call again, please advise that our office has not completed paperwork for a [facility]. The only thing Dr Vickey Huger recommended was referral to rehab for the patient to have physical therapy assess her current level of care. Patient did have an apt scheduled in July but cancelled due to no transportation. She is rescheduled for September. From our standpoint, Dr Dohmeier deferred the rehab medicine MD to complete the necessary paperwork for whether she is a candidate for [facility].

## 2020-06-04 ENCOUNTER — Encounter: Payer: Medicare Other | Admitting: Physical Medicine and Rehabilitation

## 2020-07-08 ENCOUNTER — Ambulatory Visit
Admission: RE | Admit: 2020-07-08 | Discharge: 2020-07-08 | Disposition: A | Discharge status: Home / Self Care | Payer: Medicare Other | Source: Ambulatory Visit | Attending: Acute Care | Admitting: Acute Care

## 2020-07-08 DIAGNOSIS — Z122 Encounter for screening for malignant neoplasm of respiratory organs: Secondary | ICD-10-CM

## 2020-07-08 DIAGNOSIS — F17210 Nicotine dependence, cigarettes, uncomplicated: Secondary | ICD-10-CM

## 2020-07-08 DIAGNOSIS — Z87891 Personal history of nicotine dependence: Secondary | ICD-10-CM

## 2020-07-11 NOTE — Progress Notes (Signed)
Please call patient and let them  know their  low dose Ct was read as a Lung RADS 2: nodules that are benign in appearance and behavior with a very low likelihood of becoming a clinically active cancer due to size or lack of growth. Recommendation per radiology is for a repeat LDCT in 12 months. .Please let them  know we will order and schedule their  annual screening scan for 06/2021. Please let them  know there was notation of CAD on their  scan.  Please remind the patient  that this is a non-gated exam therefore degree or severity of disease  cannot be determined. Please have them  follow up with their PCP regarding potential risk factor modification, dietary therapy or pharmacologic therapy if clinically indicated. Pt.  is  not currently on statin therapy. Please place order for annual  screening scan for  06/2021 and fax results to PCP. Thanks so much.  Dr. Delton Coombes, Pt had an echo 04/2019 and they were unable to estimate PAS pressure.  Just wanted you to be aware. Thanks

## 2020-07-14 ENCOUNTER — Other Ambulatory Visit: Payer: Self-pay | Admitting: *Deleted

## 2020-07-14 DIAGNOSIS — Z87891 Personal history of nicotine dependence: Secondary | ICD-10-CM

## 2020-07-14 DIAGNOSIS — F17210 Nicotine dependence, cigarettes, uncomplicated: Secondary | ICD-10-CM

## 2020-07-16 ENCOUNTER — Encounter: Payer: Medicare Other | Admitting: Physical Medicine and Rehabilitation

## 2020-07-30 ENCOUNTER — Other Ambulatory Visit: Payer: Self-pay | Admitting: Gastroenterology

## 2020-07-30 DIAGNOSIS — K7460 Unspecified cirrhosis of liver: Secondary | ICD-10-CM

## 2020-08-13 ENCOUNTER — Encounter
Payer: Medicare Other | Attending: Physical Medicine and Rehabilitation | Admitting: Physical Medicine and Rehabilitation

## 2020-08-13 ENCOUNTER — Encounter: Payer: Self-pay | Admitting: Physical Medicine and Rehabilitation

## 2020-08-13 ENCOUNTER — Other Ambulatory Visit: Payer: Self-pay

## 2020-08-13 VITALS — BP 146/90 | HR 99 | Temp 98.1°F | Ht 61.0 in | Wt 183.0 lb

## 2020-08-13 DIAGNOSIS — M1711 Unilateral primary osteoarthritis, right knee: Secondary | ICD-10-CM | POA: Insufficient documentation

## 2020-08-13 DIAGNOSIS — M1990 Unspecified osteoarthritis, unspecified site: Secondary | ICD-10-CM | POA: Insufficient documentation

## 2020-08-13 DIAGNOSIS — M1712 Unilateral primary osteoarthritis, left knee: Secondary | ICD-10-CM | POA: Diagnosis not present

## 2020-08-13 DIAGNOSIS — R251 Tremor, unspecified: Secondary | ICD-10-CM | POA: Diagnosis present

## 2020-08-13 MED ORDER — PROPRANOLOL HCL 10 MG PO TABS: 10.0000 mg | ORAL_TABLET | Freq: Every day | ORAL | 1 refills | Status: DC

## 2020-08-13 NOTE — Patient Instructions (Addendum)
Blue emu oil- buy from pharmacy Start propanolol 10mg  daily for essential tremor  Turmeric to reduce inflammation--can be used in cooking or taken as a supplement.  Benefits of turmeric:  -Highly anti-inflammatory  -Increases antioxidants  -Improves memory, attention, brain disease  -Lowers risk of heart disease  -May help prevent cancer  -Decreases pain  -Alleviates depression  -Delays aging and decreases risk of chronic disease  -Consume with black pepper to increase absorption    Turmeric Milk Recipe:  1 cup milk  1 tsp turmeric  1 tsp cinnamon  1 tsp grated ginger (optional)  Black pepper (boosts the anti-inflammatory properties of turmeric).  1 tsp honey

## 2020-08-13 NOTE — Progress Notes (Signed)
Subjective:    Patient ID: [name] [name], female    DOB: 20-Mar-1951, 69 y.o.   MRN: [id number]  HPI  [name] [name] presents to establish care for pain in multiple joints. She states she has been diagnosed with arthritis. She has pain in both shoulders and both knees. She has not tried any interventions thus far. Has not tried blue emu oil, turmeric, or meloxicam. She has never had injections before but generally tolerates injections well. Average pain is 6/10, pain right now is 6/10, pain feels constant and aching. She walks with a rolling walker.   She also has tremor in both hands. Has a history of Parkinson's Disease in her father. Tremor is worse with movement and better with rest. She does consume alcohol and does not note this to improve her tremor.    Pain Inventory Average Pain 6 Pain Right Now 6 My pain is constant and aching  In the last 24 hours, has pain interfered with the following? General activity 5 Relation with others 5 Enjoyment of life 7 What TIME of day is your pain at its worst? morning , daytime, evening and night Sleep (in general) Fair  Pain is worse with: walking, bending, standing and some activites Pain improves with: rest, pacing activities and medication Relief from Meds: 1  walk with assistance use a cane use a walker how many minutes can you walk? 5 ability to climb steps?  no do you drive?  yes use a wheelchair Do you have any goals in this area?  yes  retired I need assistance with the following:  dressing, bathing, household duties and shopping  bladder control problems weakness numbness trouble walking spasms dizziness confusion depression anxiety  new  new    Family History  Problem Relation Age of Onset  . Heart disease Father   . Lung cancer Father   . Diabetes Father   . Asthma Father   . Alcoholism Father   . Lung cancer Mother    Social History   Socioeconomic History  . Marital status: Widowed    Spouse  name: Not on file  . Number of children: Not on file  . Years of education: Not on file  . Highest education level: Not on file  Occupational History  . Occupation: retired  Tobacco Use  . Smoking status: Current Every Day Smoker    Packs/day: 0.50    Years: 43.00    Pack years: 21.50    Types: Cigarettes  . Smokeless tobacco: Never Used  . Tobacco comment: relapsed smoking june 2012  Vaping Use  . Vaping Use: Never used  Substance and Sexual Activity  . Alcohol use: Yes    Alcohol/week: 12.0 standard drinks    Types: 12 Cans of beer per week    Comment: 6 cans beer daily  . Drug use: Not Currently  . Sexual activity: Not Currently    Birth control/protection: Surgical  Other Topics Concern  . Not on file  Social History Narrative   ** Merged History Encounter **       Pt was adopted at age 72 but still is in contact with biological family.    Social Determinants of Health   Financial Resource Strain:   . Difficulty of Paying Living Expenses: Not on file  Food Insecurity:   . Worried About Programme researcher, broadcasting/film/video in the Last Year: Not on file  . Ran Out of Food in the Last Year: Not on file  Transportation  Needs:   . Lack of Transportation (Medical): Not on file  . Lack of Transportation (Non-Medical): Not on file  Physical Activity:   . Days of Exercise per Week: Not on file  . Minutes of Exercise per Session: Not on file  Stress:   . Feeling of Stress : Not on file  Social Connections:   . Frequency of Communication with Friends and Family: Not on file  . Frequency of Social Gatherings with Friends and Family: Not on file  . Attends Religious Services: Not on file  . Active Member of Clubs or Organizations: Not on file  . Attends Banker Meetings: Not on file  . Marital Status: Not on file   Past Surgical History:  Procedure Laterality Date  . ABDOMINAL HYSTERECTOMY    . BIOPSY  07/31/2019   Procedure: BIOPSY;  Surgeon: Kathi Der, MD;   Location: [facility];  Service: Gastroenterology;;  . cataract surgery Bilateral   . CESAREAN SECTION    . CESAREAN SECTION  1984  . CHOLECYSTECTOMY    . CYSTECTOMY     both ears  . DILATION AND CURETTAGE OF UTERUS    . ESOPHAGOGASTRODUODENOSCOPY (EGD) WITH PROPOFOL N/A 07/31/2019   Procedure: ESOPHAGOGASTRODUODENOSCOPY (EGD) WITH PROPOFOL;  Surgeon: Kathi Der, MD;  Location: [facility];  Service: Gastroenterology;  Laterality: N/A;  . REVERSE SHOULDER ARTHROPLASTY Right 11/08/2013   Procedure: RIGHT REVERSE SHOULDER ARTHROPLASTY;  Surgeon: Senaida Lange, MD;  Location: [facility];  Service: Orthopedics;  Laterality: Right;  . skin tumor removal of bilateral ears (benign)    . TOTAL ABDOMINAL HYSTERECTOMY    . TOTAL SHOULDER REPLACEMENT Right 2018  . TUBAL LIGATION     Past Medical History:  Diagnosis Date  . Alcoholic (HCC)   . Anemia    hx of-yrs ago  . Anxiety   . Arthritis   . Asthma    uses Albuterol inhaler as needed  . Cirrhosis (HCC)   . COPD (chronic obstructive pulmonary disease) (HCC)   . Depression    takes Viibryd daily  . Depression   . Diastolic congestive heart failure (HCC)   . ETOH abuse   . Family history of anesthesia complication    sister gets sick with anesthesia  . Hard of hearing   . HTN (hypertension)    takes Lisinopril daily  . Hypertension   . Oxygen dependent   . Pneumonia    about 2 yrs ago  . Respiratory failure with hypoxia (HCC)   . Systemic inflammatory response syndrome (HCC)    BP (!) 146/90   Pulse 99   Temp 98.1 F (36.7 C)   Ht 5\' 1"  (1.549 m)   Wt 183 lb (83 kg)   SpO2 96%   BMI 34.58 kg/m   Opioid Risk Score:   Fall Risk Score:  `1  Depression screen PHQ 2/9  No flowsheet data found.  Review of Systems  Constitutional: Positive for appetite change and unexpected weight change.  HENT: Negative.   Eyes: Negative.   Respiratory: Positive for cough, shortness of breath and wheezing.   Gastrointestinal:  Positive for abdominal pain.  Endocrine: Negative.   Genitourinary: Positive for difficulty urinating.  Musculoskeletal: Positive for arthralgias, back pain, gait problem, myalgias, neck pain and neck stiffness.       Spasms  Allergic/Immunologic: Negative.   Neurological: Positive for dizziness, weakness and numbness.  Hematological: Bruises/bleeds easily.  Psychiatric/Behavioral: Positive for confusion and dysphoric mood. The patient is nervous/anxious.  Objective:   Physical Exam  Gen: no distress, normal appearing HEENT: oral mucosa pink and moist, NCAT Cardio: Reg rate Chest: normal effort, normal rate of breathing, on nasal cannula with oxygen tank Abd: soft, non-distended Ext: no edema Skin: intact Neuro: Alert and oriented x3 Musculoskeletal: Diffuse tenderness to shoulder and knee joints bilaterally. Antalgic gait with RW.  Psych: pleasant, normal affect    Assessment & Plan:  [name] [name] is a 69 year old woman who presents to establish care for the following conditions:  1) Chronic Pain Syndrome secondary to shoulder, knee and hip arthritis -Blue emu oil topically on joints as needed -Discussed current symptoms of pain and history of pain.  -Discussed benefits of exercise in reducing pain. -Turmeric to reduce inflammation--can be used in cooking or taken as a supplement.  Benefits of turmeric:  -Highly anti-inflammatory  -Increases antioxidants  -Improves memory, attention, brain disease  -Lowers risk of heart disease  -May help prevent cancer  -Decreases pain  -Alleviates depression  -Delays aging and decreases risk of chronic disease  -Consume with black pepper to increase absorption  Turmeric Milk Recipe:  1 cup milk  1 tsp turmeric  1 tsp cinnamon  1 tsp grated ginger (optional)  Black pepper (boosts the anti-inflammatory properties of turmeric).  1 tsp honey   2) Essential tremor -Start Propanolol 10mg  daily, will also help  with elevated HR (to 99 this visit) -If well tolerated but tremor persists, can uptitrate dose.

## 2020-08-18 ENCOUNTER — Ambulatory Visit
Admission: RE | Admit: 2020-08-18 | Discharge: 2020-08-18 | Disposition: A | Discharge status: Home / Self Care | Payer: Medicare Other | Source: Ambulatory Visit | Attending: Gastroenterology | Admitting: Gastroenterology

## 2020-08-18 DIAGNOSIS — K7460 Unspecified cirrhosis of liver: Secondary | ICD-10-CM

## 2020-09-29 ENCOUNTER — Encounter: Payer: Medicare Other | Admitting: Physical Medicine and Rehabilitation

## 2020-10-10 ENCOUNTER — Other Ambulatory Visit: Payer: Self-pay | Admitting: Physical Medicine and Rehabilitation

## 2020-10-16 ENCOUNTER — Encounter: Payer: Medicare Other | Admitting: Physical Medicine and Rehabilitation

## 2020-11-13 ENCOUNTER — Other Ambulatory Visit: Payer: Self-pay | Admitting: Physical Medicine and Rehabilitation

## 2020-11-19 ENCOUNTER — Encounter: Payer: Self-pay | Admitting: Physical Medicine and Rehabilitation

## 2020-11-19 ENCOUNTER — Other Ambulatory Visit: Payer: Self-pay

## 2020-11-19 ENCOUNTER — Encounter
Payer: Medicare Other | Attending: Physical Medicine and Rehabilitation | Admitting: Physical Medicine and Rehabilitation

## 2020-11-19 VITALS — BP 145/99 | HR 99 | Temp 98.7°F | Ht 61.0 in | Wt 188.0 lb

## 2020-11-19 DIAGNOSIS — M1711 Unilateral primary osteoarthritis, right knee: Secondary | ICD-10-CM | POA: Diagnosis not present

## 2020-11-19 NOTE — Progress Notes (Signed)
Right Knee injection  Indication: Knee pain not relieved by medication management and other conservative care.  Informed consent was obtained after describing risks and benefits of the procedure with the patient, this includes bleeding, bruising, infection and medication side effects. The patient wishes to proceed and has given written consent. The patient was placed in a seated position. The lateral aspect of the knee was marked and prepped with Betadine and alcohol. After negative draw back for blood, a solution containing Monovisc was injected with a 25 gauge 1.5 inch needle. The patient tolerated the procedure well. Post procedure instructions were given. 

## 2020-12-03 ENCOUNTER — Other Ambulatory Visit: Payer: Self-pay | Admitting: Physical Medicine and Rehabilitation

## 2020-12-31 ENCOUNTER — Encounter
Payer: Medicare Other | Attending: Physical Medicine and Rehabilitation | Admitting: Physical Medicine and Rehabilitation

## 2020-12-31 ENCOUNTER — Other Ambulatory Visit: Payer: Self-pay

## 2020-12-31 ENCOUNTER — Encounter: Payer: Self-pay | Admitting: Physical Medicine and Rehabilitation

## 2020-12-31 VITALS — BP 124/82 | HR 108 | Temp 98.2°F | Ht 61.5 in | Wt 191.0 lb

## 2020-12-31 DIAGNOSIS — M1712 Unilateral primary osteoarthritis, left knee: Secondary | ICD-10-CM | POA: Insufficient documentation

## 2020-12-31 DIAGNOSIS — M1711 Unilateral primary osteoarthritis, right knee: Secondary | ICD-10-CM | POA: Insufficient documentation

## 2020-12-31 DIAGNOSIS — M7502 Adhesive capsulitis of left shoulder: Secondary | ICD-10-CM | POA: Diagnosis not present

## 2020-12-31 MED ORDER — LIDOCAINE 5 % EX PTCH: 1.0000 | MEDICATED_PATCH | CUTANEOUS | 0 refills | Status: AC

## 2020-12-31 NOTE — Patient Instructions (Signed)
Blue emu oil  Turmeric to reduce inflammation--can be used in cooking or taken as a supplement.  Benefits of turmeric:  -Highly anti-inflammatory  -Increases antioxidants  -Improves memory, attention, brain disease  -Lowers risk of heart disease  -May help prevent cancer  -Decreases pain  -Alleviates depression  -Delays aging and decreases risk of chronic disease  -Consume with black pepper to increase absorption    Turmeric Milk Recipe:  1 cup milk  1 tsp turmeric  1 tsp cinnamon  1 tsp grated ginger (optional)  Black pepper (boosts the anti-inflammatory properties of turmeric).  1 tsp honey 

## 2020-12-31 NOTE — Progress Notes (Signed)
Subjective:    Patient ID: [name] [name], female    DOB: 06-06-1951, 70 y.o.   MRN: [id number]  HPI  [name] [name] presents to establish care for pain in multiple joints. She states she has been diagnosed with arthritis. She has pain in both shoulders and both knees. She has not tried any interventions thus far. Has not tried blue emu oil, turmeric, or meloxicam. She has never had injections before but generally tolerates injections well. Average pain is 6/10, pain right now is 6/10, pain feels constant and aching. She walks with a rolling walker.   She also has tremor in both hands. Has a history of Parkinson's Disease in her father. Tremor is worse with movement and better with rest. She does consume alcohol and does not note this to improve her tremor.   1) Bilateral knee OA: -right knee pain much improved after viscosupplementation Now left knee pain is more bothersome She has been applying blue emu oil with good benefit Unable to do PT as she cannot drive to appointments but willing to HEP -has been using turmeric  2) frozen shoulder left shoulder: -pain and stiff with limitations in all planes -she is wary of steroid injections due to side effects -asks whether viscosupplementation would be possible for shoulder.  -average pain 6/10   Pain Inventory Average Pain 6 Pain Right Now 7 My pain is constant, burning and aching  In the last 24 hours, has pain interfered with the following? General activity 4 Relation with others 5 Enjoyment of life 6 What TIME of day is your pain at its worst? morning  and evening Sleep (in general) Poor  Pain is worse with: walking, bending, standing and some activites Pain improves with: rest, pacing activities, medication and injections Relief from Meds: 7  walk with assistance use a cane use a walker how many minutes can you walk? 5 ability to climb steps?  no do you drive?  yes use a wheelchair Do you have any goals in this area?   yes  retired I need assistance with the following:  dressing, bathing, household duties and shopping  bladder control problems weakness numbness trouble walking spasms dizziness confusion depression anxiety  Any changes since last visit?  no  Any changes since last visit?  no    Family History  Problem Relation Age of Onset  . Heart disease Father   . Lung cancer Father   . Diabetes Father   . Asthma Father   . Alcoholism Father   . Lung cancer Mother    Social History   Socioeconomic History  . Marital status: Widowed    Spouse name: Not on file  . Number of children: Not on file  . Years of education: Not on file  . Highest education level: Not on file  Occupational History  . Occupation: retired  Tobacco Use  . Smoking status: Current Every Day Smoker    Packs/day: 0.50    Years: 43.00    Pack years: 21.50    Types: Cigarettes  . Smokeless tobacco: Never Used  . Tobacco comment: relapsed smoking june 2012  Vaping Use  . Vaping Use: Never used  Substance and Sexual Activity  . Alcohol use: Yes    Alcohol/week: 12.0 standard drinks    Types: 12 Cans of beer per week    Comment: 6 cans beer daily  . Drug use: Not Currently  . Sexual activity: Not Currently    Birth control/protection: Surgical  Other  Topics Concern  . Not on file  Social History Narrative   ** Merged History Encounter **       Pt was adopted at age 89 but still is in contact with biological family.    Social Determinants of Health   Financial Resource Strain: Not on file  Food Insecurity: Not on file  Transportation Needs: Not on file  Physical Activity: Not on file  Stress: Not on file  Social Connections: Not on file   Past Surgical History:  Procedure Laterality Date  . ABDOMINAL HYSTERECTOMY    . BIOPSY  07/31/2019   Procedure: BIOPSY;  Surgeon: Kathi Der, MD;  Location: [facility];  Service: Gastroenterology;;  . cataract surgery Bilateral   . CESAREAN  SECTION    . CESAREAN SECTION  1984  . CHOLECYSTECTOMY    . CYSTECTOMY     both ears  . DILATION AND CURETTAGE OF UTERUS    . ESOPHAGOGASTRODUODENOSCOPY (EGD) WITH PROPOFOL N/A 07/31/2019   Procedure: ESOPHAGOGASTRODUODENOSCOPY (EGD) WITH PROPOFOL;  Surgeon: Kathi Der, MD;  Location: [facility];  Service: Gastroenterology;  Laterality: N/A;  . REVERSE SHOULDER ARTHROPLASTY Right 11/08/2013   Procedure: RIGHT REVERSE SHOULDER ARTHROPLASTY;  Surgeon: Senaida Lange, MD;  Location: [facility];  Service: Orthopedics;  Laterality: Right;  . skin tumor removal of bilateral ears (benign)    . TOTAL ABDOMINAL HYSTERECTOMY    . TOTAL SHOULDER REPLACEMENT Right 2018  . TUBAL LIGATION     Past Medical History:  Diagnosis Date  . Alcoholic (HCC)   . Anemia    hx of-yrs ago  . Anxiety   . Arthritis   . Asthma    uses Albuterol inhaler as needed  . Cirrhosis (HCC)   . COPD (chronic obstructive pulmonary disease) (HCC)   . Depression    takes Viibryd daily  . Depression   . Diastolic congestive heart failure (HCC)   . ETOH abuse   . Family history of anesthesia complication    sister gets sick with anesthesia  . Hard of hearing   . HTN (hypertension)    takes Lisinopril daily  . Hypertension   . Oxygen dependent   . Pneumonia    about 2 yrs ago  . Respiratory failure with hypoxia (HCC)   . Systemic inflammatory response syndrome (HCC)    There were no vitals taken for this visit.  Opioid Risk Score:   Fall Risk Score:  `1  Depression screen PHQ 2/9  Depression screen PHQ 2/9 08/13/2020  Decreased Interest 2  Down, Depressed, Hopeless 3  PHQ - 2 Score 5  Altered sleeping 2  Tired, decreased energy 3  Change in appetite 2  Feeling bad or failure about yourself  2  Trouble concentrating 2  Moving slowly or fidgety/restless 2  Suicidal thoughts 0  PHQ-9 Score 18    Review of Systems  Constitutional: Positive for appetite change.  HENT: Negative.   Eyes: Negative.    Respiratory: Positive for cough, shortness of breath and wheezing.   Gastrointestinal: Positive for abdominal pain.  Endocrine: Negative.   Genitourinary: Negative.   Musculoskeletal: Positive for arthralgias, back pain, gait problem, myalgias, neck pain and neck stiffness.       Spasms  Skin: Negative.   Allergic/Immunologic: Negative.   Neurological: Positive for dizziness, weakness and numbness.  Hematological: Bruises/bleeds easily.  Psychiatric/Behavioral: Positive for confusion and dysphoric mood. The patient is nervous/anxious.        Objective:   Physical Exam Gen: no  distress, normal appearing HEENT: oral mucosa pink and moist, NCAT Cardio: Reg rate Chest: normal effort, normal rate of breathing Abd: soft, non-distended Ext: no edema Psych: pleasant, normal affect Skin: intact Neuro: Alert and oriented x3 Musculoskeletal: Diffuse tenderness to shoulder and knee joints bilaterally. Antalgic gait with RW. Left shoulder external rotation extremely limited.  Psych: pleasant, normal affect    Assessment & Plan:  [name] [name] is a 70 year old woman who presents to establish care for the following conditions:  1) Chronic Pain Syndrome secondary to bilateral knee OA -continue Blue emu oil topically on joints as needed -Discussed current symptoms of pain and history of pain.  -Discussed benefits of exercise in reducing pain. -viscosupplementation to left knee next visit.  Turmeric to reduce inflammation--can be used in cooking or taken as a supplement.  Benefits of turmeric:  -Highly anti-inflammatory  -Increases antioxidants  -Improves memory, attention, brain disease  -Lowers risk of heart disease  -May help prevent cancer  -Decreases pain  -Alleviates depression  -Delays aging and decreases risk of chronic disease  -Consume with black pepper to increase absorption    Turmeric Milk Recipe:  1 cup milk  1 tsp turmeric  1 tsp cinnamon  1 tsp  grated ginger (optional)  Black pepper (boosts the anti-inflammatory properties of turmeric).  1 tsp honey   2) Essential tremor -Start Propanolol 10mg  daily, will also help with elevated HR (to 99 this visit) -If well tolerated but tremor persists, can uptitrate dose.   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

## 2021-01-05 ENCOUNTER — Telehealth: Payer: Self-pay | Admitting: *Deleted

## 2021-01-05 NOTE — Telephone Encounter (Signed)
Prior auth initated and approved via CoverMyMeds. Request Reference Number: [id number]. LIDOCAINE PAD 5% is approved through 09/26/2021. Your patient may now fill this prescription and it will be covered.

## 2021-01-12 ENCOUNTER — Other Ambulatory Visit: Payer: Self-pay | Admitting: Physical Medicine and Rehabilitation

## 2021-02-12 ENCOUNTER — Other Ambulatory Visit: Payer: Self-pay | Admitting: Gastroenterology

## 2021-02-12 DIAGNOSIS — K7460 Unspecified cirrhosis of liver: Secondary | ICD-10-CM

## 2021-02-13 ENCOUNTER — Other Ambulatory Visit: Payer: Self-pay | Admitting: Physical Medicine and Rehabilitation

## 2021-02-16 ENCOUNTER — Ambulatory Visit: Payer: Medicare Other | Admitting: Physical Medicine and Rehabilitation

## 2021-02-20 ENCOUNTER — Encounter: Payer: Medicare Other | Admitting: Physical Medicine and Rehabilitation

## 2021-02-24 ENCOUNTER — Ambulatory Visit: Payer: Medicare Other | Admitting: Physical Medicine and Rehabilitation

## 2021-03-25 ENCOUNTER — Ambulatory Visit
Admission: RE | Admit: 2021-03-25 | Discharge: 2021-03-25 | Disposition: A | Discharge status: Home / Self Care | Payer: Medicare Other | Source: Ambulatory Visit | Attending: Gastroenterology | Admitting: Gastroenterology

## 2021-03-25 DIAGNOSIS — K7460 Unspecified cirrhosis of liver: Secondary | ICD-10-CM

## 2021-05-05 IMAGING — US ULTRASOUND ABDOMEN LIMITED
1 series · 14 of 25 positions shown · non-contrast
Comparison: 12/02/2012

CLINICAL DATA: Cirrhosis

EXAM:
ULTRASOUND ABDOMEN LIMITED RIGHT UPPER QUADRANT

[Series 1: ultrasound abdomen limited · 0.28mm/px · 14 of 34 slices shown]
[im 1/34]
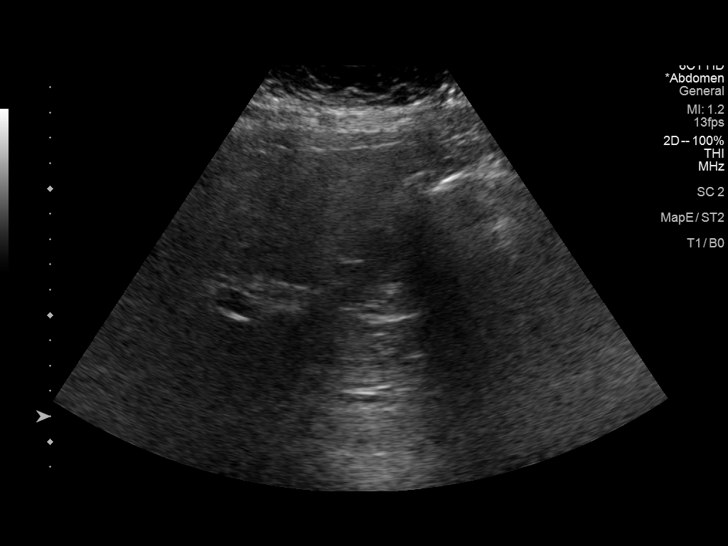
[im 3/34]
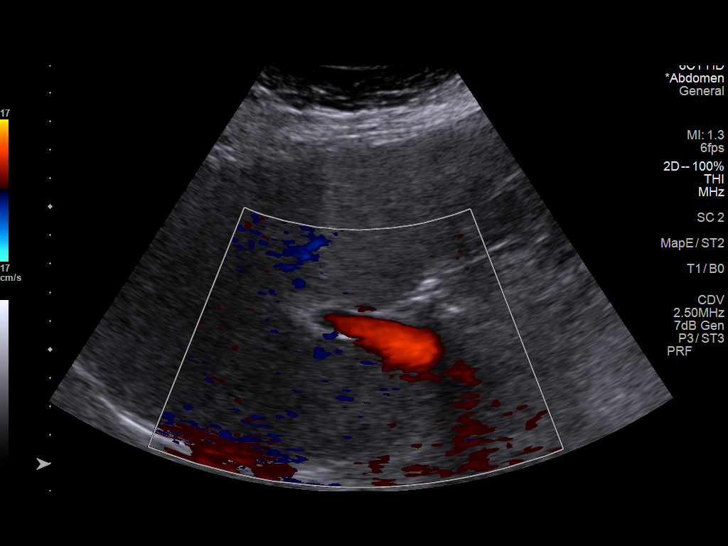
[im 6/34]
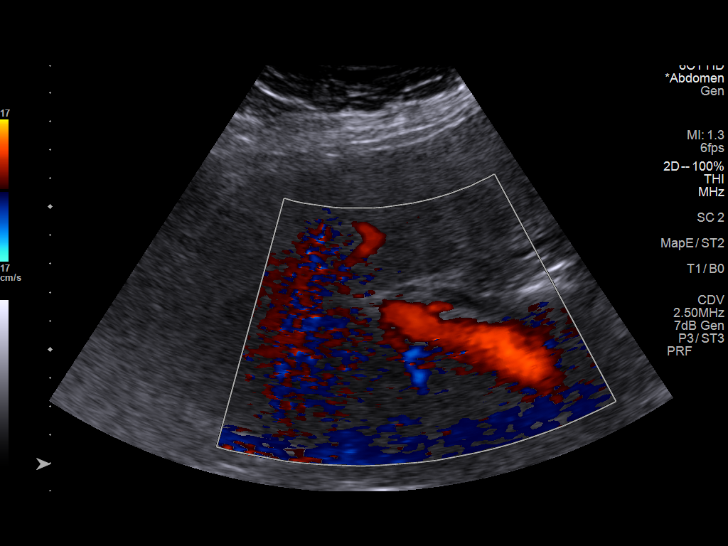
[im 9/34]
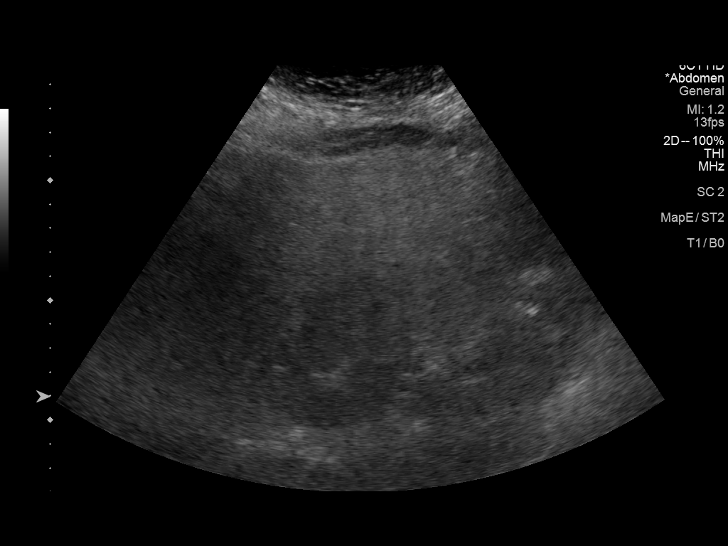
[im 12/34]
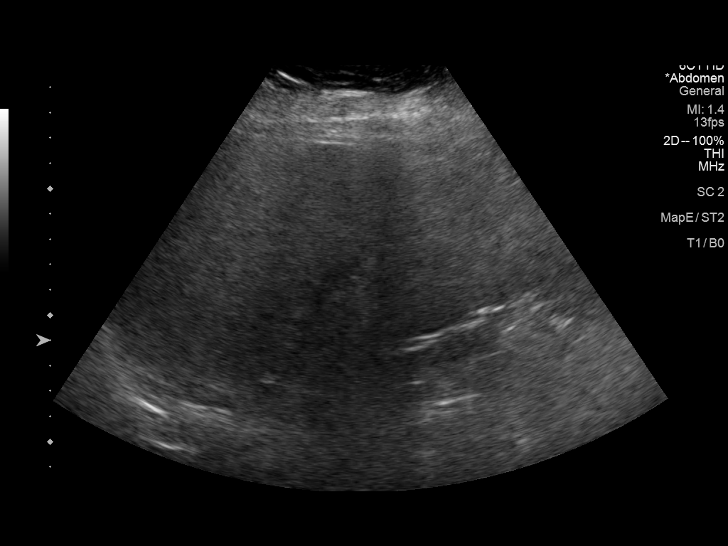
[im 13/34]
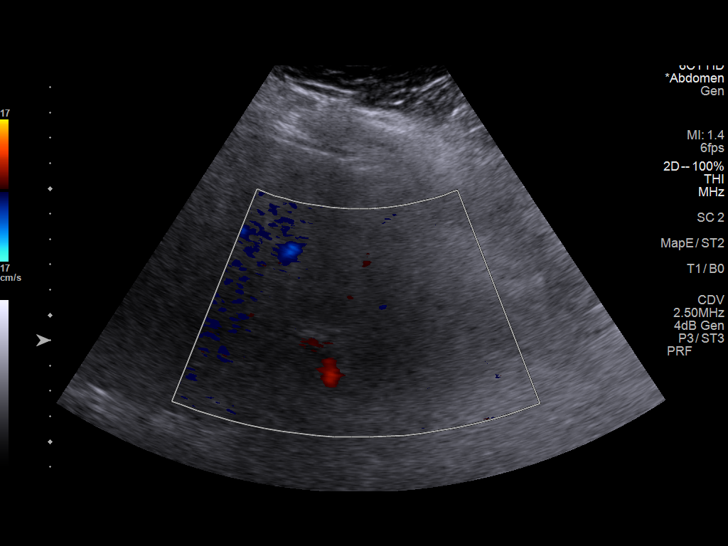
[im 16/34]
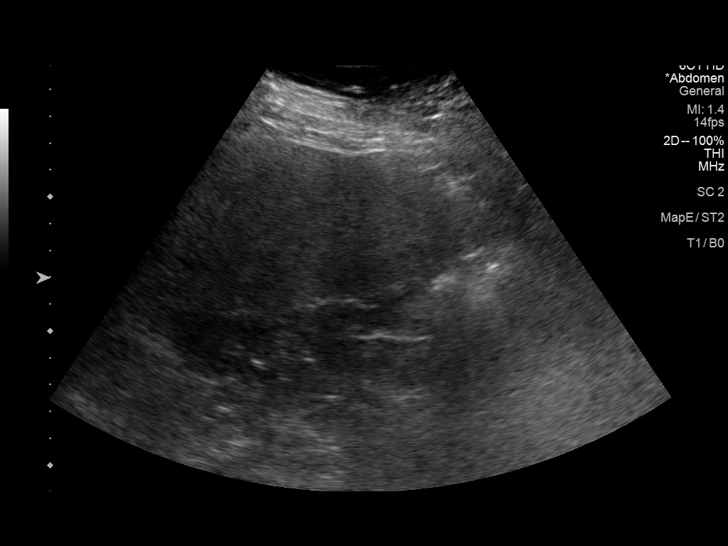
[im 18/34]
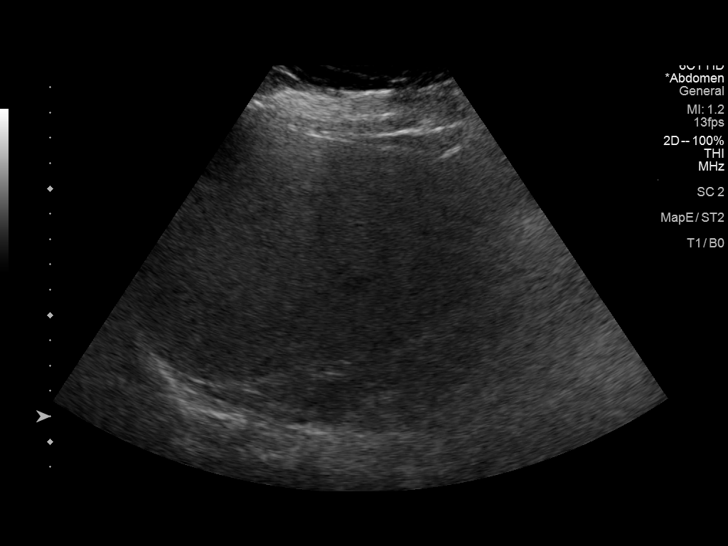
[im 21/34]
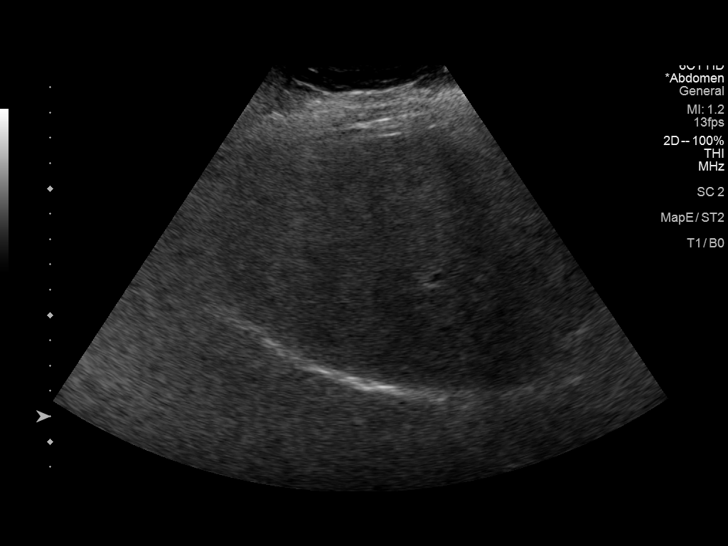
[im 23/34]
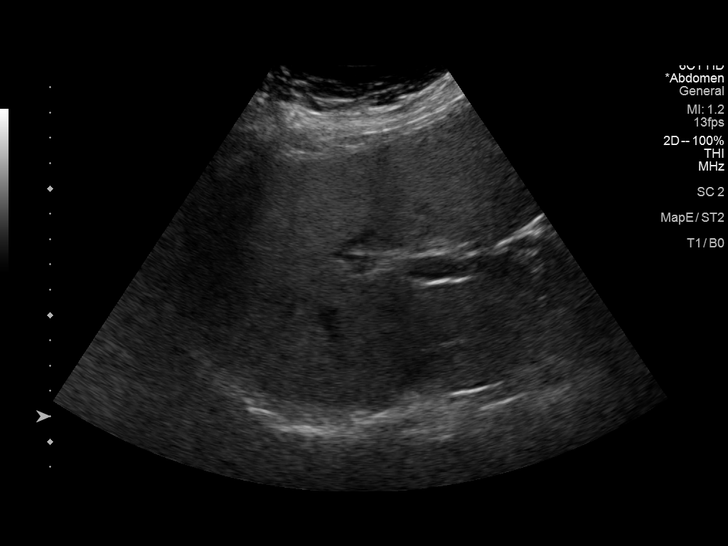
[im 25/34]
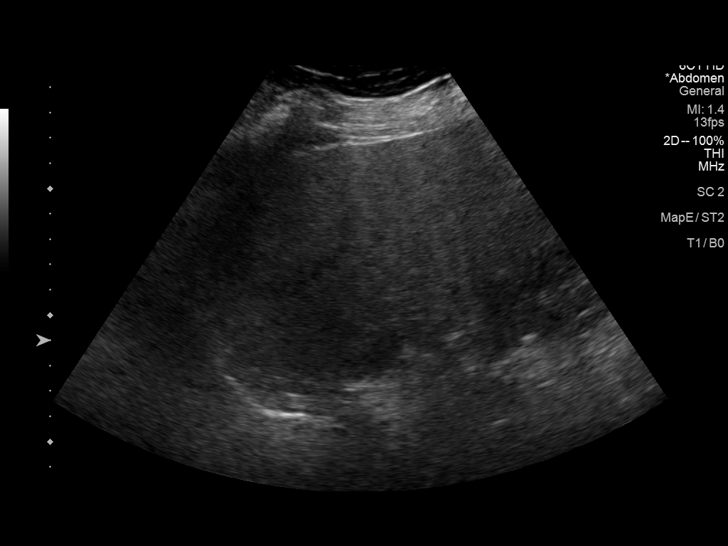
[im 28/34]
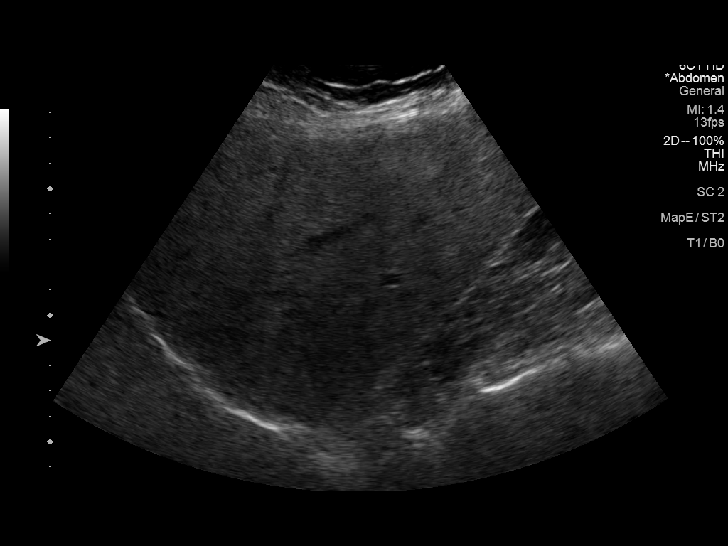
[im 31/34]
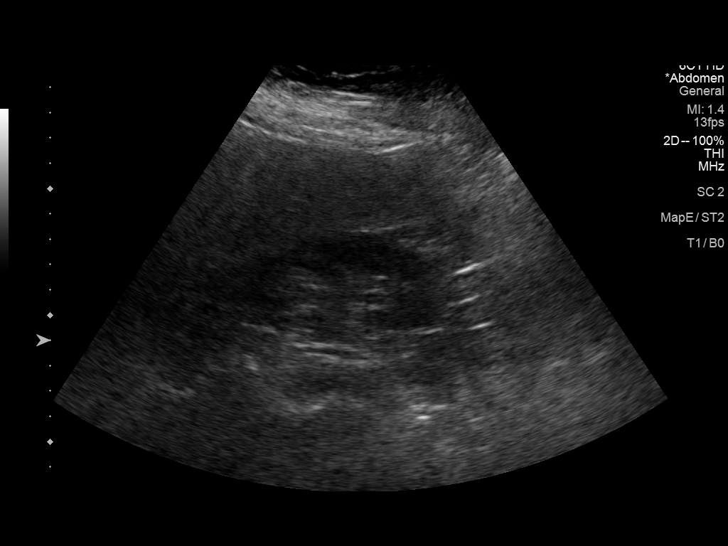
[im 34/34]
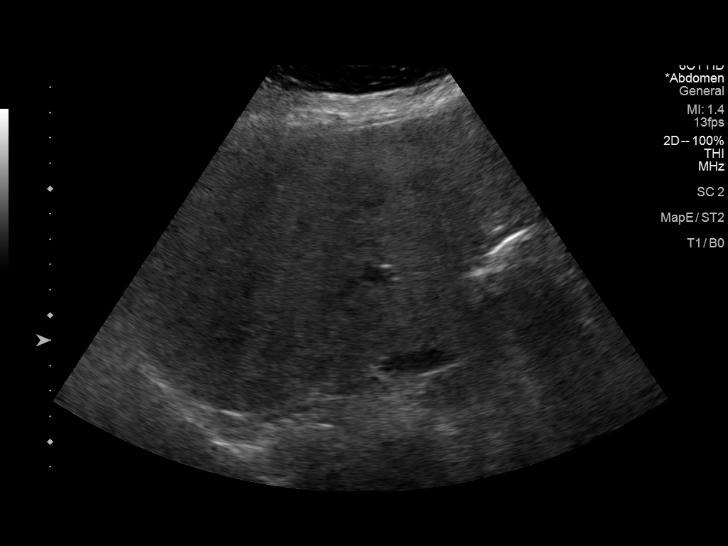

[14 of 25 positions shown; findings below may reference images not displayed]

FINDINGS: Gallbladder:

Prior cholecystectomy.

Common bile duct:

Diameter: 4.4 mm

Liver:

No focal hepatic mass. Coarse hepatic echotexture with a nodular
contour or consistent with cirrhosis. Portal vein is patent on color
Doppler imaging with normal direction of blood flow towards the
liver.
IMPRESSION: Cirrhotic liver.  No focal hepatic mass.

## 2021-05-12 ENCOUNTER — Encounter: Payer: Medicare Other | Admitting: Physical Medicine and Rehabilitation

## 2021-06-12 ENCOUNTER — Encounter: Payer: Medicare Other | Admitting: Physical Medicine and Rehabilitation

## 2021-08-07 ENCOUNTER — Other Ambulatory Visit: Payer: Self-pay

## 2021-08-07 DIAGNOSIS — F17210 Nicotine dependence, cigarettes, uncomplicated: Secondary | ICD-10-CM

## 2021-08-07 DIAGNOSIS — Z87891 Personal history of nicotine dependence: Secondary | ICD-10-CM

## 2021-09-08 ENCOUNTER — Encounter: Payer: Medicare Other | Admitting: Physical Medicine and Rehabilitation

## 2021-12-08 ENCOUNTER — Other Ambulatory Visit: Payer: Self-pay | Admitting: Gastroenterology

## 2021-12-08 DIAGNOSIS — K7460 Unspecified cirrhosis of liver: Secondary | ICD-10-CM

## 2021-12-22 ENCOUNTER — Other Ambulatory Visit: Payer: Self-pay

## 2021-12-22 ENCOUNTER — Encounter
Payer: Medicare Other | Attending: Physical Medicine and Rehabilitation | Admitting: Physical Medicine and Rehabilitation

## 2021-12-22 ENCOUNTER — Encounter: Payer: Self-pay | Admitting: Physical Medicine and Rehabilitation

## 2021-12-22 VITALS — BP 119/79 | HR 91 | Ht 61.5 in | Wt 197.0 lb

## 2021-12-22 DIAGNOSIS — M1711 Unilateral primary osteoarthritis, right knee: Secondary | ICD-10-CM | POA: Diagnosis present

## 2021-12-22 MED ORDER — MELOXICAM 7.5 MG PO TABS: 7.5000 mg | ORAL_TABLET | ORAL | 3 refills | Status: DC | PRN

## 2021-12-22 NOTE — Progress Notes (Addendum)
Right Knee injection  Indication: Knee pain not relieved by medication management and other conservative care.  Informed consent was obtained after describing risks and benefits of the procedure with the patient, this includes bleeding, bruising, infection and medication side effects. The patient wishes to proceed and has given written consent. The patient was placed in a seated position. The lateral aspect of the knee was marked and prepped with Betadine and alcohol. After negative draw back for blood, a solution containing Synvisc was injected with a 25 gauge 1.5 inch needle. The patient tolerated the procedure well. Post procedure instructions were given.

## 2021-12-22 NOTE — Patient Instructions (Signed)

## 2021-12-28 ENCOUNTER — Ambulatory Visit
Admission: RE | Admit: 2021-12-28 | Discharge: 2021-12-28 | Disposition: A | Discharge status: Home / Self Care | Payer: Medicare Other | Source: Ambulatory Visit | Attending: Gastroenterology | Admitting: Gastroenterology

## 2021-12-28 DIAGNOSIS — K7460 Unspecified cirrhosis of liver: Secondary | ICD-10-CM

## 2022-03-03 ENCOUNTER — Other Ambulatory Visit: Payer: Self-pay | Admitting: Physical Medicine and Rehabilitation

## 2022-03-06 ENCOUNTER — Other Ambulatory Visit: Payer: Self-pay | Admitting: Physical Medicine and Rehabilitation

## 2022-06-24 ENCOUNTER — Encounter
Payer: Medicare Other | Attending: Physical Medicine and Rehabilitation | Admitting: Physical Medicine and Rehabilitation

## 2022-08-01 IMAGING — CT CT CHEST LUNG CANCER SCREENING LOW DOSE W/O CM
2 of 5 series · 15 of 40 positions shown, 18 images · non-contrast
Comparison: 05/30/2019.

CLINICAL DATA: Current smoker, 97 pack-year history.

EXAM:
CT CHEST WITHOUT CONTRAST LOW-DOSE FOR LUNG CANCER SCREENING
TECHNIQUE: Multidetector CT imaging of the chest was performed following the
standard protocol without IV contrast.

[Series 4: lung 1.00 br44 cor · coronal · 0.61mm/px · 3 of 369 slices shown]
[im 74/369  lung]
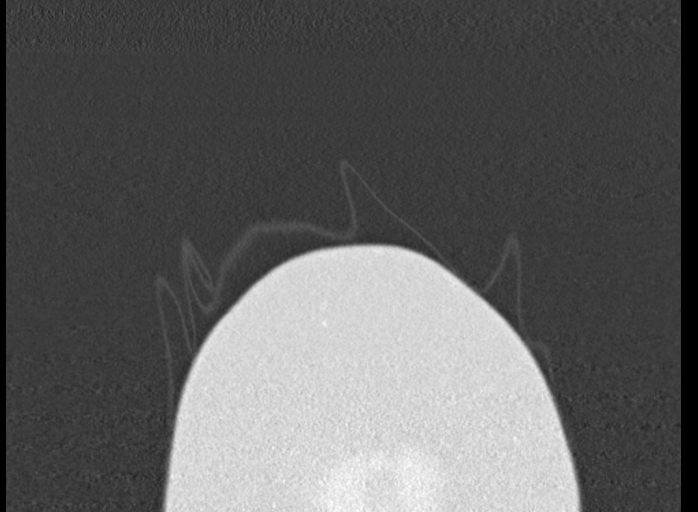
[im 148/369  lung]
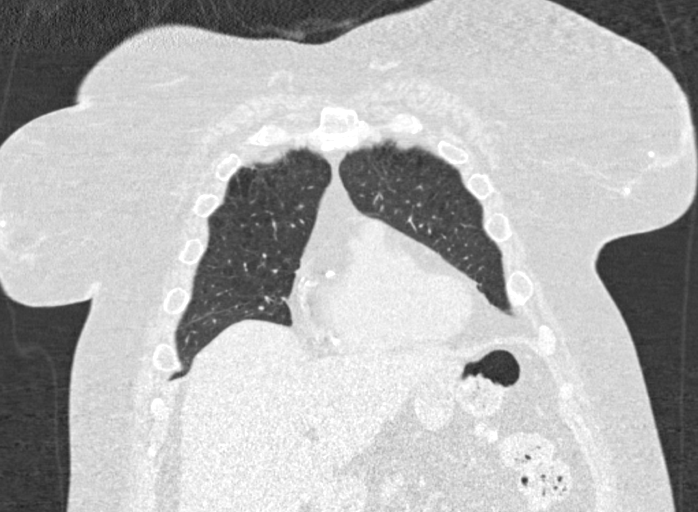
[im 221/369  lung]
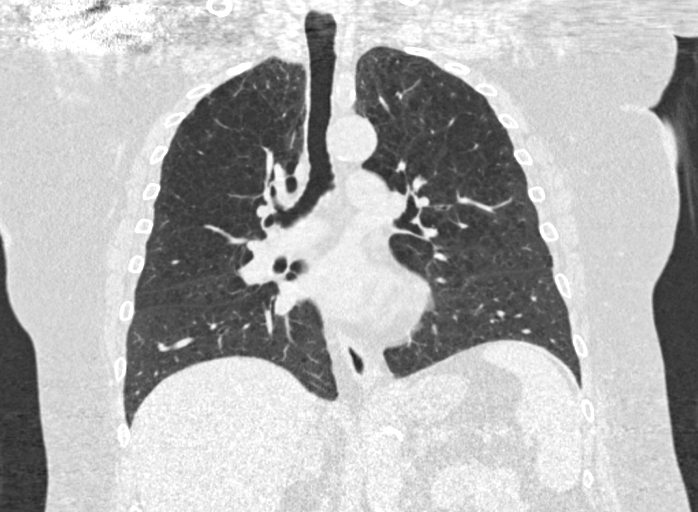

[Series 9: lung 1.00 br60 axial · axial · 0.84mm/px · z∈[-1273,-990]mm · 12 of 313 slices shown, 15 images]
[im 15/313  mediastinal]
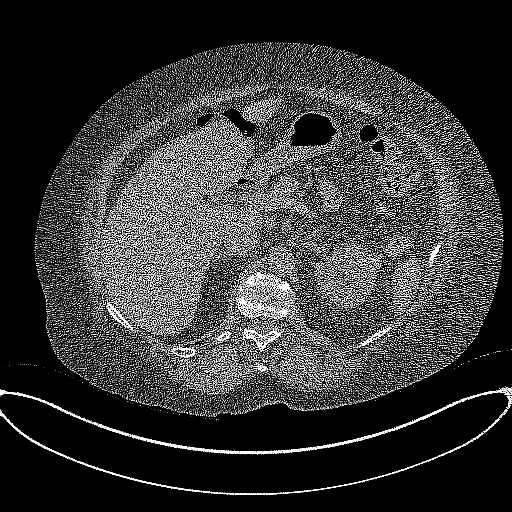
[im 15/313  lung]
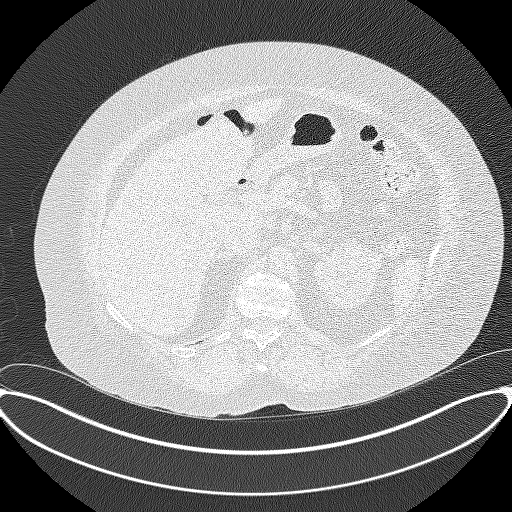
[im 43/313  lung]
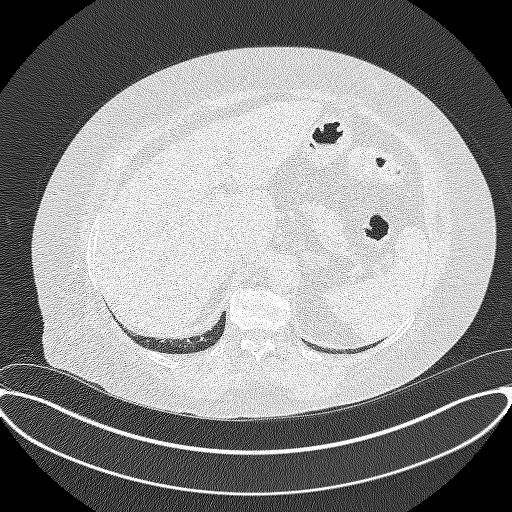
[im 71/313  lung]
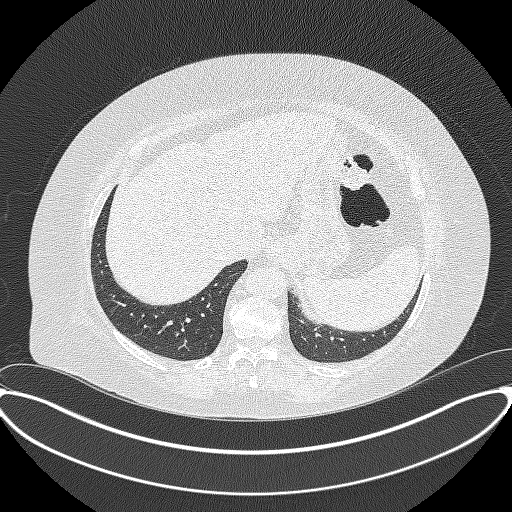
[im 100/313  lung]
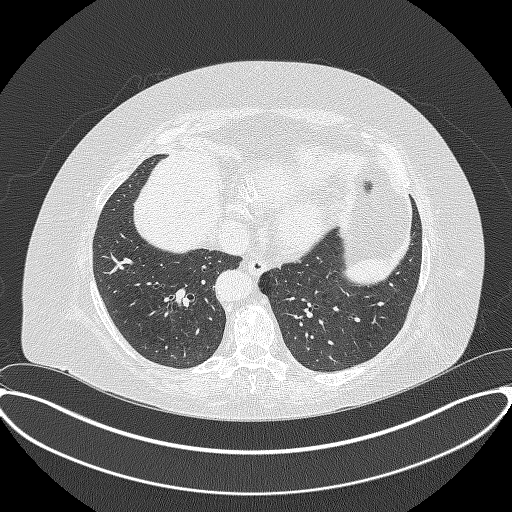
[im 114/313  mediastinal]
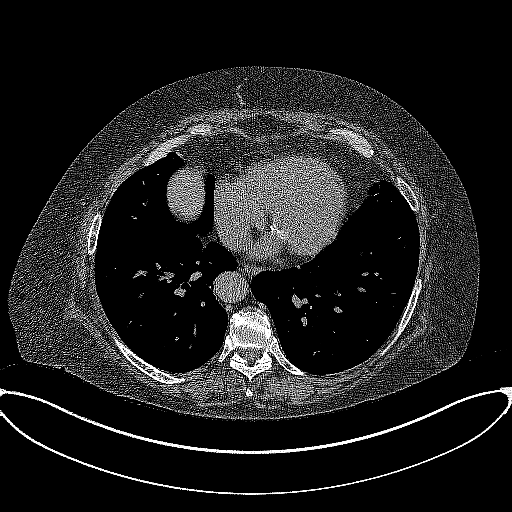
[im 114/313  lung]
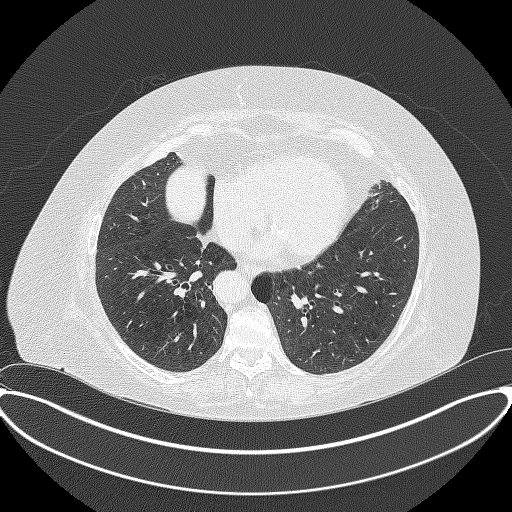
[im 142/313  lung]
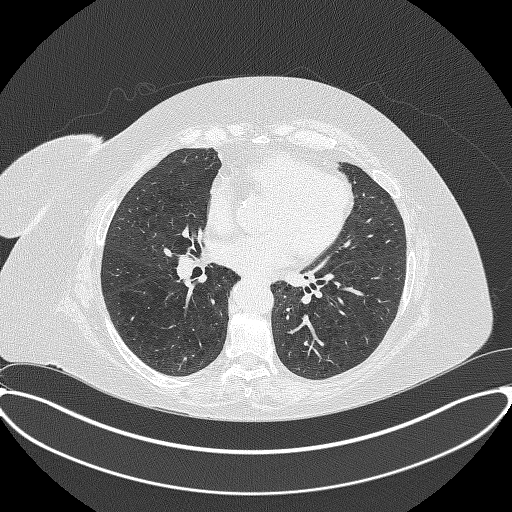
[im 171/313  lung]
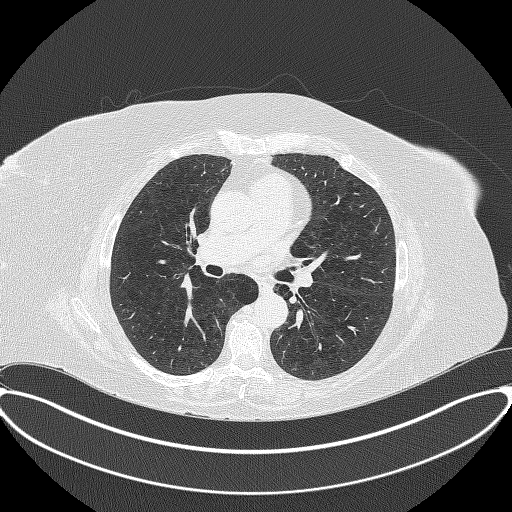
[im 199/313  lung]
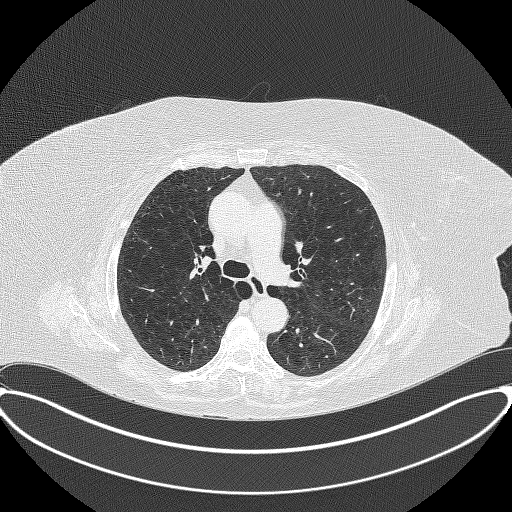
[im 213/313  mediastinal]
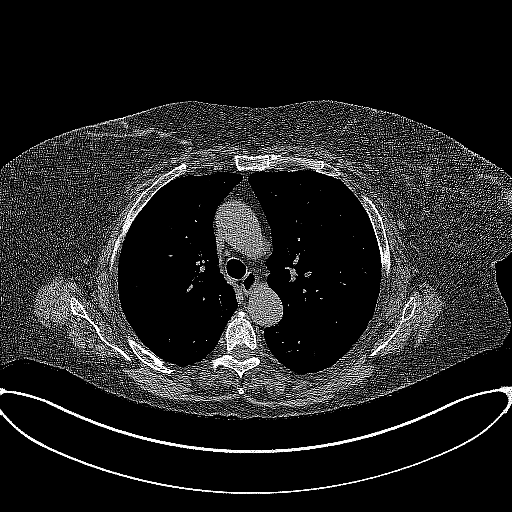
[im 213/313  lung]
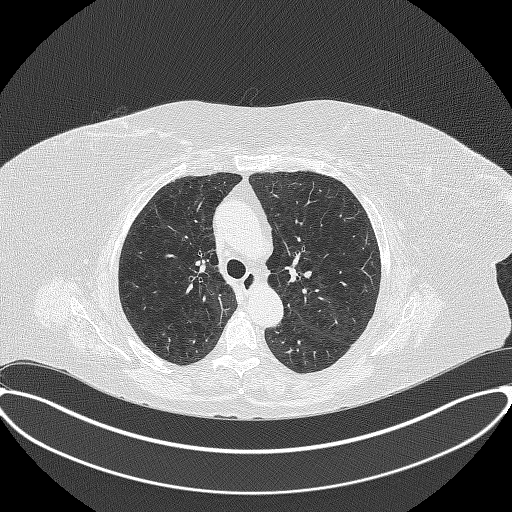
[im 242/313  lung]
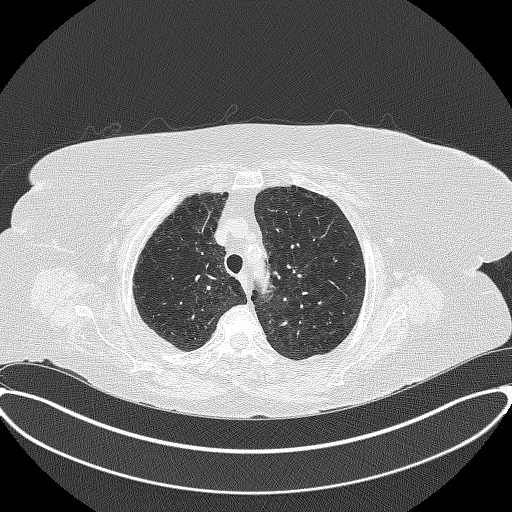
[im 270/313  lung]
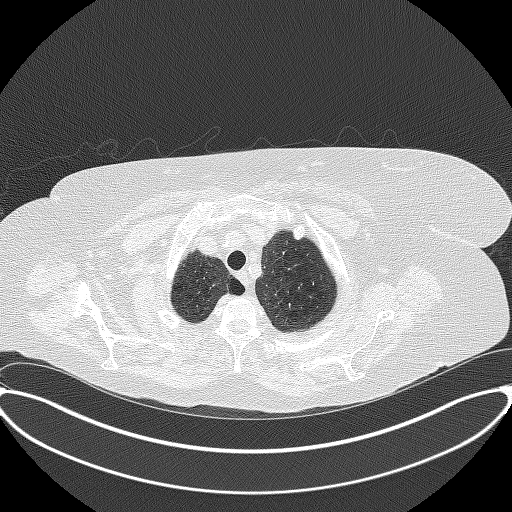
[im 298/313  lung]
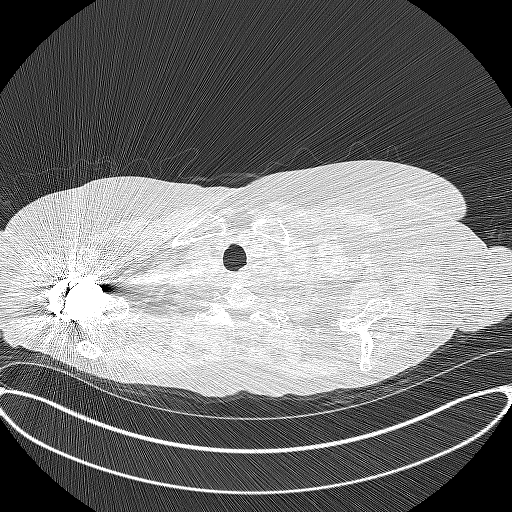

[15 of 40 positions shown; findings below may reference images not displayed]

FINDINGS: Cardiovascular: Atherosclerotic calcification of the aorta, aortic
valve and coronary arteries. Pulmonic trunk is enlarged. Heart is at
the upper limits of normal in size. No pericardial effusion.

Mediastinum/Nodes: No pathologically enlarged mediastinal or
axillary lymph nodes. Hilar regions are difficult to evaluate
without IV contrast but appear grossly unremarkable. Esophagus is
grossly unremarkable.

Lungs/Pleura: Centrilobular and paraseptal emphysema. 2.5 mm
subpleural right upper lobe nodule is unchanged. No suspicious
pulmonary nodules. Calcified left lower lobe granuloma. No pleural
fluid. Debris is seen in the airway.

Upper Abdomen: Liver margin is irregular. Cholecystectomy. Nodular
thickening of both adrenal glands. 1.3 cm hyperdense lesion in the
upper pole left kidney cannot be characterized without post-contrast
imaging. 2.3 cm low-attenuation lesion in the upper pole left kidney
is incompletely imaged. Lesions are similar to 05/30/2019. Spleen
and visualized portions of the pancreas, stomach and bowel are
grossly unremarkable.

Musculoskeletal: Degenerative changes in the spine. No worrisome
lytic or sclerotic lesions. Right shoulder arthroplasty.
IMPRESSION: 1. Lung-RADS 2, benign appearance or behavior. Continue annual
screening with low-dose chest CT without contrast in 12 months.
2. Cirrhosis.
3. Left renal lesions, better seen and evaluated on MR abdomen
02/17/2020.
[DATE]. Aortic atherosclerosis (LZPG7-CSP.P). Coronary artery
calcification.
5. Enlarged pulmonic trunk, indicative of pulmonary arterial
hypertension.
6.  Emphysema ([id number]).

## 2022-12-22 NOTE — Care Plan (Signed)
  Problem: Airway Clearance - Ineffective Goal: Patent airway Outcome: Progressing   Problem: Gas Exchange - Impaired Goal: Adequate oxygenation Outcome: Progressing

## 2023-06-08 NOTE — Progress Notes (Signed)
 Patient ID: [name] [name] is a 72 y.o. (DOB March 01, 1951) female  Primary Care Provider : Tanda MALVA Eke, MD [facility] GI Provider:  Dr. Renetta       Assessment: 1. Alcoholic cirrhosis of liver without ascites (*)   2. Hepatic encephalopathy (*)   3. Primary hypertension   4. Gastroesophageal reflux disease, unspecified whether esophagitis present   5. Chronic respiratory failure with hypoxia (*)   6. Depression, unspecified depression type     Plan:  1/2 - Alcoholic cirrhosis with hx of hepatic encephalopathy - Discussed with patient and sister importance of cutting back and quitting alcohol  completely. Patient states she will continue to work on this but she is not ready to quit drinking yet. Will check labs today to monitor cirrhosis including cbc, cmp, PT/INR and AFP. Patient to continue on lactulose  daily and can adjust dosage for goal of 2-3 soft stools daily. Will send in refills for lactulose  and propranolol  as patient is out of both of these medications. Patient is agreeable to proceed with EGD and does have driver. Unfortunately, her sister got confused and went to wrong GI office for procedure. Patient is agreeable to re-schedule EGD and they both know how to get to [facility] now.  3 - HTN - patient BP was very elevated today. Patient states she has been out of all her medication including BP medication and needs refills. Will give 30 day supply today and instructed patient to follow-up with PCP in next month for recheck and further refills.   4 - GERD - will send in new Rx for pantoprazole  40 mg daily as patient stopped taking this and symptoms worsened. She states she did well on pantoprazole  and it controlled her symptoms but ran out of this medication. 5 - Chronic respiratory failure - patient states she is out of her Trelegy Ellipta  inhaler and needs refills. Will give 30 day supply and instructed patient to return to PCP for follow-up and further refills.  6 -  Depression - patient states she is out of sertraline and has been having some worsening depression symptoms. She does still have wellbutrin that she is taking. Will give 30 day supply until patient can follow-up with her PCP to re-evaluate.    Orders Placed This Encounter  Procedures  . CBC And Differential  . Comprehensive metabolic panel  . Protime-INR  . AFP Tumor Marker  . EGD Diagnostic   No follow-ups on file.      Risks, benefits, and alternatives of the medications and treatment plan prescribed today were discussed, and patient expressed understanding. Plan follow-up as discussed or as needed if any worsening symptoms or change in condition.  Please excuse any typos as this was dictated with a voice recognition device    Chief Complaint: Follow-up on alcoholic cirrhosis and discuss EGD   HPI [name] [name] is a 72 y.o. female with a past medical history of liver cirrhosis, COPD, hypertension,  seen today for follow-up on alcoholic cirrhosis and discuss EGD.   Patient states she has been doing well overall. She presents today with her sister. She states she is walking better and getting stronger. She states she is currently drinking 7 beers/day which is less than 14 beers/day which is what she was previously drinking. She states she is not ready to quit drinking but is willing to cut back further. She denies any recent confusion. She is living on her own. She states she is having a BM daily  with one dose of lactulose  daily. She states stool is brown but hard and has some pain with passing stool. She denies melena/blood in stool. She denies N/V. She denies any swelling in legs/abdomen. No abdominal pain. She states she has quit smoking and just had 1 year anniversary of this. She states she is out of her medications and needs refills. She states she is having more reflux, burning/heartburn and is using Tums every night. She states she was taking pantoprazole  previously which helped  but weaned off this and symptoms have returned. She states she is also out of her COPD inhaler, BP medications and zoloft. Her BP was elevated and did improve with recheck but remained elevated.    Past GI History:  [facility] Pulmonary 12/19/22 - No vascular filling defect to suggest pulmonary emboli. Emphysema. Cardiomegaly with coronary artery calcifications. Dilatation of the main pulmonary artery which can be seen with pulmonary hypertension. There is a 1.9 x 1.5 cm lesion in the left kidney which does not have the imaging characteristics of a simple cyst and may represent a solid lesion such as neoplasm. Follow-up renal ultrasound may be obtained for further evaluation. Alternatively follow-up MRI of the kidneys may be obtained to evaluate for neoplasm/left renal mass.  US  Abd/pelvis complete 12/19/22 - Hepatomegaly/hepatic steatosis, morphologic changes of cirrhosis. No liver lesions visualized. Normal spleen size. No ascites. Extra hepatic biliary ductal dilatation, postcholecystectomy. Mild nonspecific prominence of the pancreatic duct  Past Medical History:  Diagnosis Date  . Cirrhosis (*)   . COPD (chronic obstructive pulmonary disease) (*)   . Hypertension     History reviewed. No pertinent surgical history.  Family and Social History:   family history is not on file.   reports that she quit smoking about 20 months ago. Her smoking use included cigarettes. She started smoking about 53 years ago. She has a 104.00 pack-year smoking history. She has never used smokeless tobacco. She reports current alcohol  use of about 49.0 standard drinks of alcohol  per week. She reports that she does not currently use drugs.  Review of Systems:  Review of Systems  Constitutional:  Negative for appetite change, chills, fever and unexpected weight change.  HENT:  Negative for trouble swallowing.   Eyes: Negative.   Respiratory:  Negative for shortness of breath.   Cardiovascular:  Negative for chest  pain.  Gastrointestinal:  Negative for abdominal distention, abdominal pain, anal bleeding, blood in stool, constipation, diarrhea, nausea, rectal pain and vomiting.  Skin:  Negative for rash.  Psychiatric/Behavioral:  Negative for confusion.      Physical Exam:  BP (!) 144/92 (BP Location: Right arm)   Pulse 102   Temp 97.4 F (36.3 C) (Temporal)   Ht 5' 1 (1.549 m)   Wt 204 lb 12.8 oz (92.9 kg)   BMI 38.70 kg/m    Physical Exam Vitals reviewed.  Constitutional:      General: She is not in acute distress.    Appearance: Normal appearance. She is obese. She is not ill-appearing.  HENT:     Head: Normocephalic and atraumatic.  Eyes:     General: No scleral icterus.    Extraocular Movements: Extraocular movements intact.     Conjunctiva/sclera: Conjunctivae normal.     Pupils: Pupils are equal, round, and reactive to light.  Cardiovascular:     Rate and Rhythm: Normal rate and regular rhythm.     Pulses: Normal pulses.     Heart sounds: Normal heart sounds.  Musculoskeletal:  General: No swelling.  Pulmonary:     Effort: Pulmonary effort is normal. No respiratory distress.     Breath sounds: Normal breath sounds.  Abdominal:     General: Bowel sounds are normal. There is no distension.     Palpations: Abdomen is soft.     Tenderness: There is no abdominal tenderness. There is no guarding or rebound.  Skin:    General: Skin is warm and dry.  Neurological:     Mental Status: She is alert and oriented to person, place, and time.  Psychiatric:        Mood and Affect: Mood normal.        Behavior: Behavior normal.        Thought Content: Thought content normal.        Judgment: Judgment normal.       Patient's Medications       * Accurate as of June 08, 2023  2:13 PM. Reflects encounter med changes as of last refresh          Continued Medications      Instructions  amLODIPine  besylate 5 mg tablet Commonly known as: NORVASC   5 mg, Oral,  Daily   aspirin-acetaminophen -caffeine 500-500-65 mg Pack  1 packet, Oral, Once as needed   buPROPion hcl 300 mg 24 hr tablet Commonly known as: WELLBUTRIN XL  300 mg, Oral, Daily   folic acid  1 mg tablet  1 mg, Oral, Daily   ipratropium-albuterol  0.5-2.5 mg/3 mL ML Soln nebulizer solution Commonly known as: DUONEB  3 mLs, Nebulization, Every 6 hours as needed respiratory   lactulose  10 g/15 mL solution Commonly known as: LACTULOSE ,GENERLAC   20 g, Oral, At bedtime   melatonin 3 mg Tabs  3 mg, Oral, At bedtime as needed   Misc. Devices Misc  Severe copd  Referral to tele health pulmonary rehab   multivitamin tablet  1 tablet, Oral, Daily   naltrexone 50 mg tablet Commonly known as: REVIA  50 mg, Oral, Daily   OXYGEN  Inhalation   pantoprazole  sodium 40 mg tablet Commonly known as: PROTONIX   40 mg, Oral, Daily   propranolol  HCl 10 mg tablet Commonly known as: INDERAL   10 mg, Oral, Daily   sertraline 100 mg tablet Commonly known as: ZOLOFT  100 mg, Oral, Daily   TRELEGY ELLIPTA  100-62.5-25 MCG/ACT Aepb inhaler Generic drug: fluticasone -umeclidin-vilant  1 puff, Inhalation, Daily       Discontinued Medications    meloxicam  7.5 mg tablet Commonly known as: MOBIC  Stopped by: Josette FORBES Stains, PA                                      *Some images could not be shown.

## 2023-08-08 NOTE — ED Provider Notes (Signed)
 [facility]  ED Provider Note  [name] [name] 72 y.o. female DOB: 01-Dec-1950 MRN: [id number] History   Chief Complaint  Patient presents with  . Abdominal Pain    Pt BIB ems from home after having black stools x1.5 months, diarrhea x1 month. Reports abdominal pain all over and going into back. Hx of alcoholism and cirrhosis, did not want to get colonoscopy.  . Hand Pain    Pt states she thinks she was bit by something last night on her left thumb, not red and painful to touch   The patient initially presented to [facility] ED for evaluation of loose black stools x 1 month.  The patient has a prior known history of alcohol  induced cirrhosis of the liver and acknowledges she continues to drink.  She last drank beer yesterday.  She denies any blood in her emesis or stools.  She does states she last thrown up yesterday.  She reports generalized abdominal cramping pain.  She denies any history of pancreatitis or esophageal varices.  She denies any history of GI bleed.  The patient has been recommended endoscopies and colonoscopies as an outpatient but is never followed up on these.   History provided by:  Patient Abdominal Pain Associated symptoms: nausea and vomiting   Associated symptoms: no chills and no fever   Hand Pain Associated symptoms: no fever       Past Medical History:  Diagnosis Date  . Cirrhosis (*)   . COPD (chronic obstructive pulmonary disease) (*)   . Hypertension     Past Surgical History:  Procedure Laterality Date  . Cholecystectomy    . Hysterectomy    . Joint replacement      Social History   Substance and Sexual Activity  Alcohol  Use Yes  . Alcohol /week: 21.0 standard drinks of alcohol   . Types: 21 Cans of beer per week   Social History   Tobacco Use  Smoking Status Former  . Current packs/day: 0.00  . Average packs/day: 2.0 packs/day for 52.0 years (104.0 ttl pk-yrs)  . Types: Cigarettes  . Start date: 3  .  Quit date: 2023  . Years since quitting: 1.8  Smokeless Tobacco Never   E-Cigarettes  . Vaping Use Never User   . Start Date    . Cartridges/Day    . Quit Date     Social History   Substance and Sexual Activity  Drug Use Not Currently         Allergies  Allergen Reactions  . Wound Dressing Adhesive Hives, Itching and Rash    Causes skin to itch and break out  Paper tape  . Codeine Unknown  . Penicillins Rash    Did it involve swelling of the face/tongue/throat, SOB, or low BP? No  Did it involve sudden or severe rash/hives, skin peeling, or any reaction on the inside of your mouth or nose? No  Did you need to seek medical attention at a hospital or doctor's office? Yes  When did it last happen?      20+ years  If all above answers are "NO", may proceed with cephalosporin use.    Discharge Medication List as of 08/08/2023  3:58 PM     CONTINUE these medications which have NOT CHANGED   Details  albuterol  sulfate HFA (PROVENTIL ,VENTOLIN ,PROAIR ) 108 (90 Base) MCG/ACT inhaler SMARTSIG:Via Inhaler, Historical Med    amLODIPine  besylate (NORVASC ) 5 mg tablet Take one tablet (5 mg dose) by mouth daily., Starting Wed  06/08/2023, Normal    aspirin-acetaminophen -caffeine 500-500-65 mg PACK Take 1 packet by mouth 1 (one) time if needed (headache and pain)., Historical Med    buPROPion hcl (WELLBUTRIN XL) 300 mg 24 hr tablet Take one tablet (300 mg dose) by mouth daily., Historical Med    folic acid  1 mg tablet Take one tablet (1 mg dose) by mouth daily., Starting Wed 06/08/2023, Normal    ipratropium-albuterol  (DUONEB) 0.5-2.5 mg/3 mL ML SOLN nebulizer solution Take 3 mLs by nebulization every 6 (six) hours as needed., Starting Wed 12/22/2022, Normal    lactulose  10 g/15 mL solution Take 30 mLs (20 g dose) by mouth at bedtime., Starting Wed 06/08/2023, Normal    melatonin 3 mg TABS Take one tablet (3 mg dose) by mouth at bedtime as needed (insomnia)., Starting Wed 12/22/2022, OTC     Misc. Devices MISC Severe copd  Referral to tele health pulmonary rehab, Print    Multiple Vitamin (MULTIVITAMIN) tablet Take one tablet by mouth daily., Historical Med    naltrexone (REVIA) 50 mg tablet Take one tablet (50 mg dose) by mouth daily., Historical Med    OXYGEN Inhale into the lungs., Historical Med    pantoprazole  sodium (PROTONIX ) 40 mg tablet Take one tablet (40 mg dose) by mouth daily., Starting Wed 06/08/2023, Normal    propranolol  HCl (INDERAL ) 10 mg tablet Take one tablet (10 mg dose) by mouth daily., Starting Wed 06/08/2023, Normal    sertraline (ZOLOFT) 100 mg tablet Take one tablet (100 mg dose) by mouth daily., Starting Wed 06/08/2023, Normal    TRELEGY ELLIPTA  100-62.5-25 MCG/ACT AEPB inhaler Inhale one puff into the lungs daily., Starting Wed 06/08/2023, Normal        Primary Survey  Primary Survey  Review of Systems   Review of Systems  Constitutional:  Negative for chills and fever.  Gastrointestinal:  Positive for abdominal pain, nausea and vomiting.  All other systems reviewed and are negative.   Physical Exam   ED Triage Vitals [08/08/23 1336]  BP (!) 181/88  Heart Rate 83  Resp 20  SpO2 97 %  Temp 98.1 F (36.7 C)    Physical Exam  Nursing note and vitals reviewed. Constitutional: She appears well-developed and well-nourished.  HENT:  Head: Normocephalic.  Eyes: EOM are intact. Conjunctivae are normal.  Neck: Normal range of motion.  Cardiovascular: Normal rate and regular rhythm.  Pulmonary/Chest: Respiratory effort normal and breath sounds normal.  Abdominal: Soft. There is no abdominal tenderness. There is no guarding. Abdomen not distended. There is no CVA tenderness.  Musculoskeletal: Normal range of motion.     Cervical back: Normal range of motion.   Neurological: She is alert and oriented to person, place, and time.  Skin: Skin is dry.    ED Course   Lab results:   CBC AND DIFFERENTIAL - Abnormal      Result  Value   WBC 11.4 (*)    RBC 3.89 (*)    HGB 12.5     HCT 38.1     MCV 97.9 (*)    MCH 32.1     MCHC 32.8     Plt Ct 223     RDW SD 53.1 (*)    MPV 9.2 (*)    NRBC% 0.0     Absolute NRBC Count 0.00     NEUTROPHIL % 74.5     LYMPHOCYTE % 14.7     MONOCYTE % 9.3     Eosinophil % 0.7  BASOPHIL % 0.4     IG% 0.4     ABSOLUTE NEUTROPHIL COUNT 8.48 (*)    ABSOLUTE LYMPHOCYTE COUNT 1.67     Absolute Monocyte Count 1.06 (*)    Absolute Eosinophil Count 0.08     Absolute Basophil Count 0.05     Absolute Immature Granulocyte Count 0.04 (*)   COMPREHENSIVE METABOLIC PANEL - Abnormal   Na 142     Potassium 3.4 (*)    Cl 102     CO2 25     AGAP 15     Glucose 115 (*)    BUN 6 (*)    Creatinine 0.70     Ca 8.9     ALK PHOS 112     T Bili 0.56     Total Protein 7.7     Alb 3.2 (*)    GLOBULIN 4.5     ALBUMIN/GLOBULIN RATIO 0.7 (*)    BUN/CREAT RATIO 8.6 (*)    ALT 27     AST 65 (*)    eGFR 93     Comment: Normal GFR (glomerular filtration rate) > 60 mL/min/1.73 meters squared, < 60 may include impaired kidney function. Calculation based on the Chronic Kidney Disease Epidemiology Collaboration (CK-EPI)equation refit without adjustment for race.  PROTIME-INR - Normal   PT 13.8     Comment: Many commonly administered drugs may affect the results obtained in PT and PTT testing.   INR 1.0     Comment: INR Therapeutic Range for DVT, PE:      2.0 - 3.0 INR Mechanical Prosthetic Heart Valves: 2.5 - 3.5  ETHANOL - Normal   Ethanol 57     Comment: Blood Alcohol  Level is for Medical Purposes Only.  LIPASE - Normal   Lipase 31    URINE CUP   Narrative:    The following orders were created for panel order Urine Cup Urine, Clean Catch. Procedure                               Abnormality         Status                    ---------                               -----------         ------                    Urine Rle[[id number]]                                                                   Please view results for these tests on the individual orders.  LIGHT BLUE TOP  GOLD SST  URINE CUP    Imaging:   CT ANGIO ABDOMEN PELVIS   Narrative:    INDICATION: GI Bleed  COMPARISON:  None.   TECHNIQUE:  CT ANGIO ABDOMEN PELVIS - 100 mL  Isovue 370 were administered intravenously. Dose reduction was utilized (automated exposure control, mA or kV adjustment based on patient size, or iterative image. Noncontrast, arterial phase,  venous phase imaging. 3-D and reformatted images obtained and reviewed. FINDINGS:  GI/VASCULATURE: - There is no visualized active extravasation into bowel. On the venous phase imaging, there is no increased luminal density to suggest a slower bleed. There is scattered colonic diverticulosis without diverticulitis. There is some residual high density material that may be due to contrast in small bowel as well as the appendix. Questionable mild circumferential thickening of the descending colon diffusely. SOLID VISCERA: - Liver: Mildly irregular hepatic contour. Overall liver attenuation mildly decreased. No focal liver lesion. - Gallbladder: Absent - Pancreas: Normal. - Adrenal glands: Normal. - Spleen: Normal. - Kidneys: Scattered cysts. 4 mm nonobstructing stone in the right kidney on series 10, image 68. No enhancing lesion or hydronephrosis. PERITONEAL CAVITY/RETROPERITONEUM: - No free fluid. - No pneumoperitoneum. - No lymphadenopathy. PELVIS: - Unremarkable bladder. No adnexal mass. VISUALIZED LOWER THORAX: - Scattered coronary calcifications. MUSCULOSKELETAL: - No acute or destructive osseous processes. Diffuse degenerative disease throughout the spine. MISC: - N/A or as above.   Impression:    IMPRESSION: 1. No visualized active GI bleed. 2. Colonic diverticulosis without diverticulitis. Apparent mild wall thickening of the descending colon may be artifactual due to relative decompression or represent a mild segmental colitis. 3.  Mildly irregular morphology of the liver suggestive of cirrhosis. Electronically Signed by: Reyes Luna, MD on 08/08/2023 3:34 PM    ECG: ECG Results   None                               Pre-Sedation Procedures    Medical Decision Making The patient had presented to [facility] ED in a timely heavy patient volume and limited bed space availability.  As such workup was initiated from triage.  Routine laboratory studies were obtained and were unremarkable and normal stable hemoglobin and hematocrit he had stable orthostatic vital signs.   CT ANGIO ABDOMEN PELVIS IMPRESSION: 1. No visualized active GI bleed. 2. Colonic diverticulosis without diverticulitis. Apparent mild wall thickening of the descending colon may be artifactual due to relative decompression or represent a mild segmental colitis. 3. Mildly irregular morphology of the liver suggestive of cirrhosis.  Results were reviewed with the patient.  Amount and/or Complexity of Data Reviewed Labs: ordered. Radiology: ordered.  Risk Prescription drug management.         Provider Communication  Discharge Medication List as of 08/08/2023  3:58 PM     START taking these medications   Details  chlordiazePOXIDE  (LIBRIUM ) 25 mg capsule Take one capsule (25 mg dose) by mouth 3 (three) times a day as needed for Anxiety (shaking from Alcohol  Withdrawal.) for up to 30 days. Max Daily Amount: 75 mg, Starting Mon 08/08/2023, Until Wed 09/07/2023 at 2359, Normal    ciprofloxacin (CIPRO) 500 mg tablet Take one tablet (500 mg dose) by mouth every 12 (twelve) hours for 10 days., Starting Mon 08/08/2023, Until Thu 08/18/2023, Normal        Discharge Medication List as of 08/08/2023  3:58 PM      Discharge Medication List as of 08/08/2023  3:58 PM      Clinical Impression Final diagnoses:  Colitis  Alcohol  abuse    ED Disposition     ED Disposition  Discharge   Condition  Stable   Comment  --                  Follow-up Information     [facility]   Contact information: [address] [place]  [address] [phone]        [facility]   Contact information: [address] [place]  [address] [phone]                 Electronically signed by:    Deward Gilmore Constant, MD 08/08/23 [phone]

## 2024-02-01 ENCOUNTER — Encounter: Payer: Self-pay | Admitting: *Deleted
